# Patient Record
Sex: Female | Born: 1945 | Race: White | Hispanic: No | Smoking: Never smoker
Health system: Southern US, Community
[De-identification: ages and names within clinical notes are randomized; demographics above are authoritative.]

## PROBLEM LIST (undated history)

## (undated) DIAGNOSIS — K449 Diaphragmatic hernia without obstruction or gangrene: Secondary | ICD-10-CM

## (undated) DIAGNOSIS — K219 Gastro-esophageal reflux disease without esophagitis: Secondary | ICD-10-CM

## (undated) DIAGNOSIS — M1990 Unspecified osteoarthritis, unspecified site: Secondary | ICD-10-CM

## (undated) DIAGNOSIS — H409 Unspecified glaucoma: Secondary | ICD-10-CM

## (undated) DIAGNOSIS — K6289 Other specified diseases of anus and rectum: Secondary | ICD-10-CM

## (undated) DIAGNOSIS — H40059 Ocular hypertension, unspecified eye: Secondary | ICD-10-CM

## (undated) DIAGNOSIS — T7840XA Allergy, unspecified, initial encounter: Secondary | ICD-10-CM

## (undated) DIAGNOSIS — K635 Polyp of colon: Secondary | ICD-10-CM

## (undated) DIAGNOSIS — E785 Hyperlipidemia, unspecified: Secondary | ICD-10-CM

## (undated) DIAGNOSIS — I10 Essential (primary) hypertension: Secondary | ICD-10-CM

## (undated) HISTORY — DX: Ocular hypertension, unspecified eye: H40.059

## (undated) HISTORY — PX: UPPER GASTROINTESTINAL ENDOSCOPY: SHX188

## (undated) HISTORY — DX: Unspecified osteoarthritis, unspecified site: M19.90

## (undated) HISTORY — PX: ABDOMINAL HYSTERECTOMY: SHX81

## (undated) HISTORY — DX: Allergy, unspecified, initial encounter: T78.40XA

## (undated) HISTORY — DX: Essential (primary) hypertension: I10

## (undated) HISTORY — DX: Other specified diseases of anus and rectum: K62.89

## (undated) HISTORY — DX: Hyperlipidemia, unspecified: E78.5

## (undated) HISTORY — DX: Unspecified glaucoma: H40.9

## (undated) HISTORY — PX: APPENDECTOMY: SHX54

## (undated) HISTORY — DX: Polyp of colon: K63.5

## (undated) HISTORY — PX: OTHER SURGICAL HISTORY: SHX169

## (undated) HISTORY — PX: HEMORRHOID SURGERY: SHX153

## (undated) HISTORY — DX: Diaphragmatic hernia without obstruction or gangrene: K44.9

## (undated) HISTORY — DX: Gastro-esophageal reflux disease without esophagitis: K21.9

---

## 1999-07-25 ENCOUNTER — Other Ambulatory Visit: Admission: RE | Admit: 1999-07-25 | Discharge: 1999-07-25 | Payer: Self-pay | Admitting: Family Medicine

## 2000-12-24 ENCOUNTER — Encounter: Admission: RE | Admit: 2000-12-24 | Discharge: 2000-12-24 | Payer: Self-pay | Admitting: Family Medicine

## 2000-12-24 ENCOUNTER — Encounter: Payer: Self-pay | Admitting: Family Medicine

## 2000-12-25 ENCOUNTER — Encounter: Payer: Self-pay | Admitting: Family Medicine

## 2000-12-25 ENCOUNTER — Encounter: Admission: RE | Admit: 2000-12-25 | Discharge: 2000-12-25 | Payer: Self-pay | Admitting: Family Medicine

## 2001-07-10 HISTORY — PX: BREAST CYST ASPIRATION: SHX578

## 2001-07-23 ENCOUNTER — Encounter: Payer: Self-pay | Admitting: Family Medicine

## 2001-07-23 ENCOUNTER — Encounter: Admission: RE | Admit: 2001-07-23 | Discharge: 2001-07-23 | Payer: Self-pay | Admitting: Family Medicine

## 2002-02-18 ENCOUNTER — Encounter: Payer: Self-pay | Admitting: Family Medicine

## 2002-02-18 ENCOUNTER — Other Ambulatory Visit: Admission: RE | Admit: 2002-02-18 | Discharge: 2002-02-18 | Payer: Self-pay | Admitting: Family Medicine

## 2002-03-03 ENCOUNTER — Encounter: Admission: RE | Admit: 2002-03-03 | Discharge: 2002-03-03 | Payer: Self-pay | Admitting: Family Medicine

## 2002-03-03 ENCOUNTER — Encounter: Payer: Self-pay | Admitting: Family Medicine

## 2004-07-29 ENCOUNTER — Ambulatory Visit: Payer: Self-pay | Admitting: Family Medicine

## 2004-08-30 ENCOUNTER — Ambulatory Visit: Payer: Self-pay | Admitting: Family Medicine

## 2004-09-06 ENCOUNTER — Ambulatory Visit: Payer: Self-pay | Admitting: Gastroenterology

## 2004-09-19 ENCOUNTER — Ambulatory Visit: Payer: Self-pay | Admitting: Gastroenterology

## 2004-11-28 ENCOUNTER — Ambulatory Visit: Payer: Self-pay | Admitting: Family Medicine

## 2004-11-29 ENCOUNTER — Encounter: Admission: RE | Admit: 2004-11-29 | Discharge: 2004-11-29 | Payer: Self-pay | Admitting: Family Medicine

## 2005-03-15 ENCOUNTER — Ambulatory Visit: Payer: Self-pay | Admitting: Family Medicine

## 2005-03-22 ENCOUNTER — Ambulatory Visit: Payer: Self-pay | Admitting: Family Medicine

## 2005-06-12 ENCOUNTER — Ambulatory Visit: Payer: Self-pay | Admitting: Family Medicine

## 2005-06-28 ENCOUNTER — Ambulatory Visit: Payer: Self-pay | Admitting: Family Medicine

## 2005-12-11 ENCOUNTER — Encounter: Admission: RE | Admit: 2005-12-11 | Discharge: 2005-12-11 | Payer: Self-pay | Admitting: Family Medicine

## 2006-02-09 ENCOUNTER — Ambulatory Visit: Payer: Self-pay | Admitting: Family Medicine

## 2006-03-10 HISTORY — PX: CATARACT EXTRACTION: SUR2

## 2006-12-25 ENCOUNTER — Encounter: Admission: RE | Admit: 2006-12-25 | Discharge: 2006-12-25 | Payer: Self-pay | Admitting: Family Medicine

## 2006-12-28 ENCOUNTER — Encounter: Admission: RE | Admit: 2006-12-28 | Discharge: 2006-12-28 | Payer: Self-pay | Admitting: Family Medicine

## 2006-12-31 ENCOUNTER — Encounter (INDEPENDENT_AMBULATORY_CARE_PROVIDER_SITE_OTHER): Payer: Self-pay | Admitting: *Deleted

## 2007-01-07 ENCOUNTER — Encounter (INDEPENDENT_AMBULATORY_CARE_PROVIDER_SITE_OTHER): Payer: Self-pay | Admitting: *Deleted

## 2007-02-08 ENCOUNTER — Encounter: Payer: Self-pay | Admitting: Family Medicine

## 2007-02-08 DIAGNOSIS — N39498 Other specified urinary incontinence: Secondary | ICD-10-CM

## 2007-02-08 DIAGNOSIS — M5137 Other intervertebral disc degeneration, lumbosacral region: Secondary | ICD-10-CM

## 2007-02-08 DIAGNOSIS — K589 Irritable bowel syndrome without diarrhea: Secondary | ICD-10-CM

## 2007-02-08 DIAGNOSIS — M1990 Unspecified osteoarthritis, unspecified site: Secondary | ICD-10-CM | POA: Insufficient documentation

## 2007-02-08 DIAGNOSIS — I10 Essential (primary) hypertension: Secondary | ICD-10-CM | POA: Insufficient documentation

## 2007-02-08 DIAGNOSIS — N6019 Diffuse cystic mastopathy of unspecified breast: Secondary | ICD-10-CM

## 2007-03-05 ENCOUNTER — Ambulatory Visit: Payer: Self-pay | Admitting: Family Medicine

## 2007-03-05 DIAGNOSIS — E785 Hyperlipidemia, unspecified: Secondary | ICD-10-CM | POA: Insufficient documentation

## 2007-03-05 DIAGNOSIS — K219 Gastro-esophageal reflux disease without esophagitis: Secondary | ICD-10-CM | POA: Insufficient documentation

## 2007-03-05 DIAGNOSIS — M8580 Other specified disorders of bone density and structure, unspecified site: Secondary | ICD-10-CM | POA: Insufficient documentation

## 2007-03-06 LAB — CONVERTED CEMR LAB
ALT: 17 units/L (ref 0–35)
Albumin: 4.2 g/dL (ref 3.5–5.2)
Calcium: 9.7 mg/dL (ref 8.4–10.5)
Creatinine, Ser: 0.7 mg/dL (ref 0.4–1.2)
Eosinophils Absolute: 0.1 10*3/uL (ref 0.0–0.6)
GFR calc Af Amer: 109 mL/min
GFR calc non Af Amer: 90 mL/min
Glucose, Bld: 90 mg/dL (ref 70–99)
HCT: 36.5 % (ref 36.0–46.0)
HDL: 55.5 mg/dL (ref >39.0)
Hemoglobin: 13 g/dL (ref 12.0–15.0)
Neutrophils Relative %: 58.1 % (ref 43.0–77.0)
Phosphorus: 4.2 mg/dL (ref 2.3–4.6)
Platelets: 229 10*3/uL (ref 150–400)
Potassium: 4.1 meq/L (ref 3.5–5.1)
RDW: 11.9 % (ref 11.5–14.6)
TSH: 0.44 microintl units/mL (ref 0.35–5.50)
Triglycerides: 123 mg/dL (ref 0–149)
WBC: 7.9 10*3/uL (ref 4.5–10.5)

## 2007-05-11 HISTORY — PX: ESOPHAGOGASTRODUODENOSCOPY: SHX1529

## 2007-05-21 ENCOUNTER — Ambulatory Visit: Payer: Self-pay | Admitting: Family Medicine

## 2007-05-28 ENCOUNTER — Ambulatory Visit: Payer: Self-pay | Admitting: Gastroenterology

## 2007-05-29 ENCOUNTER — Ambulatory Visit: Payer: Self-pay | Admitting: Gastroenterology

## 2007-05-30 ENCOUNTER — Encounter: Payer: Self-pay | Admitting: Family Medicine

## 2007-05-30 ENCOUNTER — Ambulatory Visit (HOSPITAL_COMMUNITY): Admission: RE | Admit: 2007-05-30 | Discharge: 2007-05-30 | Payer: Self-pay | Admitting: Gastroenterology

## 2007-05-31 ENCOUNTER — Encounter: Payer: Self-pay | Admitting: Family Medicine

## 2007-06-03 ENCOUNTER — Ambulatory Visit: Payer: Self-pay | Admitting: Family Medicine

## 2007-08-19 DIAGNOSIS — D126 Benign neoplasm of colon, unspecified: Secondary | ICD-10-CM

## 2007-08-19 DIAGNOSIS — K2100 Gastro-esophageal reflux disease with esophagitis, without bleeding: Secondary | ICD-10-CM | POA: Insufficient documentation

## 2007-08-19 DIAGNOSIS — K449 Diaphragmatic hernia without obstruction or gangrene: Secondary | ICD-10-CM | POA: Insufficient documentation

## 2007-09-16 ENCOUNTER — Telehealth: Payer: Self-pay | Admitting: Family Medicine

## 2007-10-24 ENCOUNTER — Encounter: Payer: Self-pay | Admitting: Family Medicine

## 2007-10-24 ENCOUNTER — Telehealth: Payer: Self-pay | Admitting: Family Medicine

## 2007-10-25 ENCOUNTER — Encounter: Payer: Self-pay | Admitting: Family Medicine

## 2007-10-30 ENCOUNTER — Ambulatory Visit: Payer: Self-pay | Admitting: Gastroenterology

## 2007-11-07 ENCOUNTER — Telehealth: Payer: Self-pay | Admitting: Gastroenterology

## 2007-11-11 ENCOUNTER — Ambulatory Visit: Payer: Self-pay | Admitting: Gastroenterology

## 2007-11-11 ENCOUNTER — Encounter: Payer: Self-pay | Admitting: Gastroenterology

## 2007-11-11 ENCOUNTER — Encounter: Payer: Self-pay | Admitting: Family Medicine

## 2008-01-06 ENCOUNTER — Encounter: Admission: RE | Admit: 2008-01-06 | Discharge: 2008-01-06 | Payer: Self-pay | Admitting: Family Medicine

## 2008-01-08 ENCOUNTER — Encounter (INDEPENDENT_AMBULATORY_CARE_PROVIDER_SITE_OTHER): Payer: Self-pay | Admitting: *Deleted

## 2008-02-03 ENCOUNTER — Ambulatory Visit: Payer: Self-pay | Admitting: Family Medicine

## 2008-02-03 LAB — CONVERTED CEMR LAB
Bilirubin Urine: NEGATIVE
Nitrite: NEGATIVE
Urobilinogen, UA: 0.2
pH: 5

## 2008-08-10 ENCOUNTER — Telehealth: Payer: Self-pay | Admitting: Family Medicine

## 2008-08-12 ENCOUNTER — Ambulatory Visit: Payer: Self-pay | Admitting: Family Medicine

## 2008-08-12 LAB — CONVERTED CEMR LAB
Epithelial cells, urine: 1 /lpf
Glucose, Urine, Semiquant: NEGATIVE
Protein, U semiquant: NEGATIVE
Specific Gravity, Urine: <1.005
Urobilinogen, UA: 0.2

## 2008-08-13 ENCOUNTER — Ambulatory Visit: Payer: Self-pay | Admitting: Family Medicine

## 2008-08-17 LAB — CONVERTED CEMR LAB
AST: 17 units/L (ref 0–37)
CO2: 0 meq/L — CL (ref 19–32)
Chloride: 110 meq/L (ref 96–112)
Creatinine, Ser: 0.7 mg/dL (ref 0.4–1.2)
Eosinophils Absolute: 0.3 10*3/uL (ref 0.0–0.7)
GFR calc Af Amer: 108 mL/min
Glucose, Bld: 83 mg/dL (ref 70–99)
HCT: 37.9 % (ref 36.0–46.0)
Hemoglobin: 12.7 g/dL (ref 12.0–15.0)
LDL Cholesterol: 78 mg/dL (ref 0–99)
Lymphocytes Relative: 35.4 % (ref 12.0–46.0)
MCV: 90 fL (ref 78.0–100.0)
Monocytes Relative: 8 % (ref 3.0–12.0)
Potassium: 4.4 meq/L (ref 3.5–5.1)
RBC: 4.21 M/uL (ref 3.87–5.11)
RDW: 12.1 % (ref 11.5–14.6)
Sodium: 142 meq/L (ref 135–145)
TSH: 0.65 microintl units/mL (ref 0.35–5.50)
Total CHOL/HDL Ratio: 2.4
Triglycerides: 99 mg/dL (ref 0–149)
WBC: 7.1 10*3/uL (ref 4.5–10.5)

## 2008-10-08 ENCOUNTER — Ambulatory Visit: Payer: Self-pay | Admitting: Family Medicine

## 2008-10-08 DIAGNOSIS — M2550 Pain in unspecified joint: Secondary | ICD-10-CM | POA: Insufficient documentation

## 2008-10-12 LAB — CONVERTED CEMR LAB
Anti Nuclear Antibody(ANA): NEGATIVE
Rhuematoid fact SerPl-aCnc: <20 intl units/mL (ref 0–20)

## 2008-11-03 ENCOUNTER — Ambulatory Visit: Payer: Self-pay | Admitting: Family Medicine

## 2008-11-03 DIAGNOSIS — J309 Allergic rhinitis, unspecified: Secondary | ICD-10-CM | POA: Insufficient documentation

## 2008-11-05 ENCOUNTER — Telehealth: Payer: Self-pay | Admitting: Family Medicine

## 2008-11-05 ENCOUNTER — Encounter: Payer: Self-pay | Admitting: Family Medicine

## 2008-12-22 ENCOUNTER — Ambulatory Visit: Payer: Self-pay | Admitting: Family Medicine

## 2008-12-22 LAB — CONVERTED CEMR LAB
Bilirubin Urine: NEGATIVE
Nitrite: NEGATIVE
Specific Gravity, Urine: 1.025

## 2008-12-23 ENCOUNTER — Encounter (INDEPENDENT_AMBULATORY_CARE_PROVIDER_SITE_OTHER): Payer: Self-pay | Admitting: Internal Medicine

## 2009-02-08 ENCOUNTER — Encounter: Admission: RE | Admit: 2009-02-08 | Discharge: 2009-02-08 | Payer: Self-pay | Admitting: Family Medicine

## 2009-02-11 ENCOUNTER — Encounter (INDEPENDENT_AMBULATORY_CARE_PROVIDER_SITE_OTHER): Payer: Self-pay | Admitting: *Deleted

## 2009-04-26 ENCOUNTER — Ambulatory Visit: Payer: Self-pay | Admitting: Family Medicine

## 2009-08-06 ENCOUNTER — Ambulatory Visit: Payer: Self-pay | Admitting: Family Medicine

## 2009-11-11 ENCOUNTER — Telehealth: Payer: Self-pay | Admitting: Family Medicine

## 2009-11-13 ENCOUNTER — Encounter: Payer: Self-pay | Admitting: Family Medicine

## 2009-11-24 ENCOUNTER — Ambulatory Visit: Payer: Self-pay | Admitting: Family Medicine

## 2009-11-25 ENCOUNTER — Encounter: Payer: Self-pay | Admitting: Family Medicine

## 2009-11-25 LAB — CONVERTED CEMR LAB
Alkaline Phosphatase: 66 units/L (ref 39–117)
BUN: 14 mg/dL (ref 6–23)
CO2: 30 meq/L (ref 19–32)
Calcium: 10 mg/dL (ref 8.4–10.5)
Chloride: 104 meq/L (ref 96–112)
Cholesterol: 179 mg/dL (ref 0–200)
Eosinophils Relative: 1.9 % (ref 0.0–5.0)
HDL: 73.2 mg/dL (ref >39.00)
Lymphocytes Relative: 28.6 % (ref 12.0–46.0)
Monocytes Absolute: 0.6 10*3/uL (ref 0.1–1.0)
Monocytes Relative: 8.4 % (ref 3.0–12.0)
Neutro Abs: 4.7 10*3/uL (ref 1.4–7.7)
RBC: 4.22 M/uL (ref 3.87–5.11)
RDW: 12.6 % (ref 11.5–14.6)
Triglycerides: 98 mg/dL (ref 0.0–149.0)
VLDL: 19.6 mg/dL (ref 0.0–40.0)
WBC: 7.7 10*3/uL (ref 4.5–10.5)

## 2009-12-01 ENCOUNTER — Ambulatory Visit: Payer: Self-pay | Admitting: Internal Medicine

## 2009-12-01 ENCOUNTER — Encounter: Payer: Self-pay | Admitting: Family Medicine

## 2009-12-20 ENCOUNTER — Encounter (INDEPENDENT_AMBULATORY_CARE_PROVIDER_SITE_OTHER): Payer: Self-pay | Admitting: *Deleted

## 2010-01-24 ENCOUNTER — Ambulatory Visit: Payer: Self-pay | Admitting: Family Medicine

## 2010-01-24 DIAGNOSIS — J020 Streptococcal pharyngitis: Secondary | ICD-10-CM

## 2010-03-07 ENCOUNTER — Encounter: Admission: RE | Admit: 2010-03-07 | Discharge: 2010-03-07 | Payer: Self-pay | Admitting: Family Medicine

## 2010-03-07 LAB — HM MAMMOGRAPHY: HM Mammogram: NEGATIVE

## 2010-03-08 ENCOUNTER — Encounter (INDEPENDENT_AMBULATORY_CARE_PROVIDER_SITE_OTHER): Payer: Self-pay | Admitting: *Deleted

## 2010-03-21 ENCOUNTER — Ambulatory Visit: Payer: Self-pay | Admitting: Family Medicine

## 2010-03-21 DIAGNOSIS — M25569 Pain in unspecified knee: Secondary | ICD-10-CM | POA: Insufficient documentation

## 2010-03-21 DIAGNOSIS — M67919 Unspecified disorder of synovium and tendon, unspecified shoulder: Secondary | ICD-10-CM | POA: Insufficient documentation

## 2010-03-21 DIAGNOSIS — M159 Polyosteoarthritis, unspecified: Secondary | ICD-10-CM | POA: Insufficient documentation

## 2010-03-21 DIAGNOSIS — M719 Bursopathy, unspecified: Secondary | ICD-10-CM

## 2010-03-28 ENCOUNTER — Encounter: Payer: Self-pay | Admitting: Family Medicine

## 2010-03-30 ENCOUNTER — Encounter: Payer: Self-pay | Admitting: Family Medicine

## 2010-04-04 ENCOUNTER — Encounter: Payer: Self-pay | Admitting: Family Medicine

## 2010-04-07 ENCOUNTER — Encounter: Payer: Self-pay | Admitting: Family Medicine

## 2010-04-20 ENCOUNTER — Encounter: Payer: Self-pay | Admitting: Family Medicine

## 2010-05-10 ENCOUNTER — Encounter: Payer: Self-pay | Admitting: Family Medicine

## 2010-05-13 ENCOUNTER — Ambulatory Visit: Payer: Self-pay | Admitting: Family Medicine

## 2010-06-17 ENCOUNTER — Telehealth: Payer: Self-pay | Admitting: Family Medicine

## 2010-06-17 ENCOUNTER — Encounter: Payer: Self-pay | Admitting: Family Medicine

## 2010-07-31 ENCOUNTER — Encounter: Payer: Self-pay | Admitting: Family Medicine

## 2010-08-11 NOTE — Miscellaneous (Signed)
Summary: Physical Therapy Status Report/[facility]  Physical Therapy Status Report/[facility]   Imported By: Maryln Gottron 05/16/2010 11:05:51  _____________________________________________________________________  External Attachment:    Type:   Image     Comment:   External Document

## 2010-08-11 NOTE — Miscellaneous (Signed)
Summary: BONE DENSITY  Clinical Lists Changes  Orders: Added new Test order of T-Bone Densitometry (77080) - Signed Added new Test order of T-Lumbar Vertebral Assessment (77082) - Signed 

## 2010-08-11 NOTE — Miscellaneous (Signed)
Summary: PT Note/[facility]  PT Note/[facility]   Imported By: Lanelle Bal 04/22/2010 14:20:59  _____________________________________________________________________  External Attachment:    Type:   Image     Comment:   External Document

## 2010-08-11 NOTE — Progress Notes (Signed)
Summary: wants to change to generic lipitor  Phone Note Call from Patient Call back at Home Phone [phone]   Caller: Patient Call For: Judith Part MD Summary of Call: Pt would like to change to generic lipitor.  She checked with her insurance and this will be less costly.  [facility] [address]. Initial call taken by: Lowella Petties CMA, AAMA,  June 17, 2010 9:57 AM  Follow-up for Phone Call        she already has an active px  please call pharmacy and authorize them to give generic  I always sign on generic line anyway Follow-up by: Judith Part MD,  June 17, 2010 10:23 AM  Additional Follow-up for Phone Call Additional follow up Details #1::        Shanda Bumps at [facility] said would change to generic Left message for Pt to call back.Lewanda Rife LPN  June 17, 2010 12:56 PM   Advised pt. Additional Follow-up by: Lowella Petties CMA, AAMA,  June 17, 2010 2:46 PM    9+

## 2010-08-11 NOTE — Letter (Signed)
Summary: Results Follow up Letter  [place] [facility]  [address] [place], [place] [address]   Phone: [phone]  Fax: [phone]    12/20/2009 MRN: [id number]    [name] [address] [place], [place]  [address]    Dear Ms. Roney Marion,  The following are the results of your recent test(s):  Test         Result    Pap Smear:        Normal _____  Not Normal _____ Comments: ______________________________________________________ Cholesterol: LDL(Bad cholesterol):         Your goal is less than:         HDL (Good cholesterol):       Your goal is more than: Comments:  ______________________________________________________ Mammogram:        Normal _____  Not Normal _____ Comments:  ___________________________________________________________________ Hemoccult:        Normal _____  Not normal _______ Comments:    _____________________________________________________________________ Other Tests:   Bone Density: Bone density is slightly worse than last time.  Continue vitamin  D and calcium  and exercise. We can discuss this  in more detail at your  next follow up appointment.    We routinely do not discuss normal results over the telephone.  If you desire a copy of the results, or you have any questions about this information we can discuss them at your next office visit.   Sincerely,    Marne A. Milinda Antis, M.D.  MAT:lsf

## 2010-08-11 NOTE — Miscellaneous (Signed)
Summary: PT Note/[facility]  PT Note/[facility]   Imported By: Lanelle Bal 04/26/2010 11:06:33  _____________________________________________________________________  External Attachment:    Type:   Image     Comment:   External Document

## 2010-08-11 NOTE — Miscellaneous (Signed)
Summary: PT Care Plan/[facility]  PT Care Plan/[facility]   Imported By: Lanelle Bal 04/15/2010 09:01:44  _____________________________________________________________________  External Attachment:    Type:   Image     Comment:   External Document

## 2010-08-11 NOTE — Letter (Signed)
Summary: Plan/[facility]  Plan/[facility]   Imported By: Lester Hanahan 04/07/2010 08:33:01  _____________________________________________________________________  External Attachment:    Type:   Image     Comment:   External Document

## 2010-08-11 NOTE — Letter (Signed)
Summary: Results Follow up Letter  [place] [facility]  [address] [place], [place] [address]   Phone: [phone]  Fax: [phone]    03/08/2010 MRN: [id number]    Deborah Gardner [address] [place], [place]  [address]    Dear Ms. DIPINTO,  The following are the results of your recent test(s):  Test         Result    Pap Smear:        Normal _____  Not Normal _____ Comments: ______________________________________________________ Cholesterol: LDL(Bad cholesterol):         Your goal is less than:         HDL (Good cholesterol):       Your goal is more than: Comments:  ______________________________________________________ Mammogram:        Normal __X___  Not Normal _____ Comments:  Yearly follow up is recommended.   ___________________________________________________________________ Hemoccult:        Normal _____  Not normal _______ Comments:    _____________________________________________________________________ Other Tests:    We routinely do not discuss normal results over the telephone.  If you desire a copy of the results, or you have any questions about this information we can discuss them at your next office visit.   Sincerely,   Marne A. Milinda Antis, M.D.  MAT:lsf

## 2010-08-11 NOTE — Assessment & Plan Note (Signed)
Summary: SORE THROAT/DLO   Vital Signs:  Patient profile:   65 year old female Height:      61 inches Weight:      148.25 pounds BMI:     28.11 Temp:     99.5 degrees F oral Pulse rate:   80 / minute Pulse rhythm:   regular BP sitting:   120 / 80  (left arm) Cuff size:   regular  Vitals Entered By: Lewanda Rife LPN (January 24, 2010 3:10 PM) CC: Sorethroat, neck glands feel swollen and today has dizziness   History of Present Illness: went to the beach last week felt chills and st since that thursday  since she got home - got worse glands got swollen achey all over- thought it was from arthritis  a few good days and bad  yesterday and today feels really bad   really achey- the upper half of body no rash    no vomiting but is nauseated  has been able to sip on fluids  using [employer]  taking tylenol and ibuprofen for fever   Allergies (verified): No Known Drug Allergies  Review of Systems General:  Complains of chills, fatigue, fever, loss of appetite, and malaise. Eyes:  Denies discharge and eye irritation. ENT:  Complains of postnasal drainage and sore throat; denies earache. CV:  Denies chest pain or discomfort and palpitations. Resp:  Denies cough and wheezing. GI:  Denies abdominal pain, diarrhea, nausea, and vomiting. MS:  Denies joint pain, joint redness, and joint swelling. Derm:  Denies lesion(s), poor wound healing, and rash. Neuro:  Denies numbness and tingling. Heme:  Denies abnormal bruising.  Physical Exam  General:  fatigued appearing female Head:  normocephalic, atraumatic, no abnormalities observed, and no abnormalities palpated.  no sinus tenderness Eyes:  vision grossly intact, pupils equal, pupils round, pupils reactive to light, and no injection.   Ears:  R ear normal and L ear normal.   Nose:  nares are boggy but clear  Mouth:  diffuse erythema in throat no swelling or exudate Neck:  nl rom and supple some ant and post  cervical LN Chest Wall:  No deformities, masses, or tenderness noted. Lungs:  Normal respiratory effort, chest expands symmetrically. Lungs are clear to auscultation, no crackles or wheezes. Heart:  Normal rate and regular rhythm. S1 and S2 normal without gallop, murmur, click, rub or other extra sounds. Skin:  Intact without suspicious lesions or rashes Cervical Nodes:  some tender ant and post cervical LNs - mobile Psych:  nl affect- seems fatigued    Impression & Recommendations:  Problem # 1:  STREP THROAT (ICD-034.0) Assessment New  uncomplicated with fever and st and malaise will tx with amox 500 three times a day for 10 days and update recommend sympt care- see pt instructions   pt advised to update me if symptoms worsen or do not improve - esp worse st or fever Her updated medication list for this problem includes:    Bayer Low Strength 81 Mg Tbec (Aspirin) ..... One by mouth every day    Tylenol Extra Strength 500 Mg Tabs (Acetaminophen) .Marland Kitchen... As needed    Ibuprofen 200 Mg Tabs (Ibuprofen) .Marland Kitchen... As needed    Amoxicillin 500 Mg Caps (Amoxicillin) .Marland Kitchen... 1 by mouth three times a day for 10 days  Orders: Rapid Strep (60454) Prescription Created Electronically [phone]  Complete Medication List: 1)  Lotrel 5-10 Mg Caps (Amlodipine besy-benazepril hcl) .... Take one by mouth daily 2)  Lipitor 20 Mg Tabs (Atorvastatin calcium) .... Take one by mouth daily 3)  Nexium 40 Mg Cpdr (Esomeprazole magnesium) .Marland Kitchen.. 1 by mouth once daily in am 4)  Calcium  .... Take by mouth as directed 5)  Multi Vitamin  .... Take by mouth as directed 6)  Bayer Low Strength 81 Mg Tbec (Aspirin) .... One by mouth every day 7)  Fish Oil Oil (Fish oil) .... 2 caps by mouth daily 8)  Flonase 50 Mcg/act Susp (Fluticasone propionate) .... 2 sprays in each nostril once daily as needed 9)  Vitamin D 1000 Unit Tabs (Cholecalciferol) .... Take one by mouth daily 10)  Vitamin C 1000 Mg Tabs (Ascorbic acid) ....  Take one by mouth daily as needed 11)  Tylenol Extra Strength 500 Mg Tabs (Acetaminophen) .... As needed 12)  Ibuprofen 200 Mg Tabs (Ibuprofen) .... As needed 13)  Arthaffect (herbal Joint Med With Glucosamine)  .... One scoop with juice daily 14)  Amoxicillin 500 Mg Caps (Amoxicillin) .Marland Kitchen.. 1 by mouth three times a day for 10 days  Patient Instructions: 1)  drink/ sip lots of fluids 2)  continue tylenol  3)  chloraseptic throat spray is helpful 4)  take the amoxicillin as directed 5)  update me if not improving in 2-3 days 6)  finish the whole course  7)  I sent px to your pharmacy Prescriptions: AMOXICILLIN 500 MG CAPS (AMOXICILLIN) 1 by mouth three times a day for 10 days  #30 x 0   Entered and Authorized by:   Judith Part MD   Signed by:   Judith Part MD on 01/24/2010   Method used:   [id number]       [address]       [place], [place]  [address]       Ph: [phone]       Fax: [phone]   RxID:   [id number]   Current Allergies (reviewed today): No known allergies   Laboratory Results  Date/Time Received: January 24, 2010 3:11 PM  Date/Time Reported: January 24, 2010 3:11 PM   Other Tests  Rapid Strep: positive  Kit Test Internal QC: Positive   (Normal Range: Negative)
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
## 2010-08-11 NOTE — Medication Information (Signed)
Summary: Approval for Additional Quantity Nexium/[facility]  Approval for Additional Quantity Nexium/[facility]   Imported By: Lanelle Bal 11/18/2009 08:37:58  _____________________________________________________________________  External Attachment:    Type:   Image     Comment:   External Document

## 2010-08-11 NOTE — Progress Notes (Signed)
Summary: Prior Authorization Nexium  Phone Note From Pharmacy Call back at [phone]   [facility]er[facility] Call For: Dr. Milinda Antis  Summary of Call: Received form from pharmacy stating that Nexium needs PA.  Called [facility] [phone] and the rep will fax form today.  Linde Gillis CMA Duncan Dull)  Nov 11, 2009 2:29 PM   PA form in your IN box.   Initial call taken by: Linde Gillis CMA Duncan Dull),  Nov 11, 2009 3:59 PM  Follow-up for Phone Call        form done and in nurse in box  Follow-up by: Judith Part MD,  Nov 12, 2009 7:58 AM  Additional Follow-up for Phone Call Additional follow up Details #1::        Completed form faxed to [phone]. form in [place] auth folder if needed later.Lewanda Rife LPN  Nov 13, 863 11:24 AM      Appended Document: Prior Authorization Nexium Received PA Approval for Nexium.  Approved from 10/23/2009 until 11/13/2010.  Will sent letter to be scanned into EMR.

## 2010-08-11 NOTE — Miscellaneous (Signed)
Summary: PT Care Plan/[facility]  PT Care Plan/[facility]   Imported By: Lanelle Bal 04/28/2010 11:37:05  _____________________________________________________________________  External Attachment:    Type:   Image     Comment:   External Document

## 2010-08-12 ENCOUNTER — Ambulatory Visit: Payer: PRIVATE HEALTH INSURANCE | Admitting: Family Medicine

## 2010-08-12 ENCOUNTER — Encounter: Payer: Self-pay | Admitting: Family Medicine

## 2010-08-12 DIAGNOSIS — K209 Esophagitis, unspecified without bleeding: Secondary | ICD-10-CM | POA: Insufficient documentation

## 2010-08-13 DIAGNOSIS — K449 Diaphragmatic hernia without obstruction or gangrene: Secondary | ICD-10-CM

## 2010-08-13 DIAGNOSIS — N390 Urinary tract infection, site not specified: Secondary | ICD-10-CM

## 2010-08-13 DIAGNOSIS — K209 Esophagitis, unspecified: Secondary | ICD-10-CM
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
## 2010-08-25 NOTE — Miscellaneous (Signed)
Summary: Physical Therapy Discharge Note/[facility]  Physical Therapy Discharge Note/[facility]   Imported By: Maryln Gottron 08/12/2010 15:07:00  _____________________________________________________________________  External Attachment:    Type:   Image     Comment:   External Document

## 2010-09-09 ENCOUNTER — Telehealth: Payer: Self-pay | Admitting: Family Medicine

## 2010-09-15 NOTE — Progress Notes (Signed)
Summary: wants rx prilosec   Phone Note Call from Patient Call back at Home Phone [phone]   Caller: Patient Call For: Judith Part MD Summary of Call: Patient was taking nexium, but it was so expensive. She then tried prilosec otc and it has really been working for her. She is asking if she can get a rx for the prilosec called in to [address] so that she can get it with her insurance.  She says that she takes one in the morning and one with her evening meal.  Initial call taken by: Melody Comas,  September 09, 2010 10:06 AM  Follow-up for Phone Call        we can try generic omeprazole - that is prilosec and may be even cheaper let me know how it works px written on EMR for call in  Follow-up by: Judith Part MD,  September 09, 2010 11:29 AM  Additional Follow-up for Phone Call Additional follow up Details #1::        Patient says she has to take 2 a day, one a day didn't help.  She will check with her insurance but she says it paid good on her Prilosec some time ago.  She was on it and it wasn't helping so she went on Nexium but since it was so expensive, she started back on Prilosec and takes it two times a day.  Lugene Fuquay CMA Duncan Dull)  September 09, 2010 3:06 PM     Additional Follow-up for Phone Call Additional follow up Details #2::    will change the px -- did not know she needed it bid Follow-up by: Judith Part MD,  September 09, 2010 3:36 PM  Additional Follow-up for Phone Call Additional follow up Details #3:: Details for Additional Follow-up Action Taken: Her insurance will cover two  Prilosec OTC 42 count pack and will pay for 2 per month for a $5 copay.  She requests [facility].  Lugene Fuquay CMA Duncan Dull)  September 09, 2010 4:11 PM    ok - will do it again  Additional Follow-up by: Judith Part MD,  September 09, 2010 4:12 PM  New/Updated Medications: OMEPRAZOLE 20 MG CPDR (OMEPRAZOLE) 1 by mouth once  daily OMEPRAZOLE 20 MG CPDR (OMEPRAZOLE) 1 by mouth two times a day PRILOSEC OTC 20 MG TBEC (OMEPRAZOLE MAGNESIUM) 1 by mouth two times a day Prescriptions: PRILOSEC OTC 20 MG TBEC (OMEPRAZOLE MAGNESIUM) 1 by mouth two times a day  #84 x 9   Entered by:   Delilah Shan CMA (AAMA)   Authorized by:   Judith Part MD   Signed by:   Delilah Shan CMA (AAMA) on 09/09/2010   Method used:   [facility]        [facility] [address] [id number]       [address]       [place], [place]  [address]       Ph: [phone]       Fax: [phone]   RxID:   [id number] PRILOSEC OTC 20 MG TBEC (OMEPRAZOLE MAGNESIUM) 1 by mouth two times a day  #84 x 9   Entered and Authorized by:   Judith Part MD   Signed by:   Judith Part MD on 09/09/2010   Method used:  Telephoned to ...       [facility] [address] [id number]       [address]       [place], [place]  [address]       Ph: [phone]       Fax: [phone]   RxID:   [id number] OMEPRAZOLE 20 MG CPDR (OMEPRAZOLE) 1 by mouth two times a day  #60 x 11   Entered and Authorized by:   Judith Part MD   Signed by:   Judith Part MD on 09/09/2010   Method used:   Telephoned to ...       [facility] [address] [id number]       [address]       [place], [place]  [address]       Ph: [phone]       Fax: [phone]   RxID:   [id number] OMEPRAZOLE 20 MG CPDR (OMEPRAZOLE) 1 by mouth once daily  #30 x 11   Entered and Authorized by:   Judith Part MD   Signed by:   Judith Part MD on 09/09/2010   Method used:   Telephoned to ...       [facility] [address] [id number]       [address]       [place], [place]  [address]       Ph: [phone]       Fax: [phone]   RxID:   [id number]

## 2010-11-22 NOTE — Assessment & Plan Note (Signed)
[facility]                         GASTROENTEROLOGY OFFICE NOTE   NAME:Deborah Gardner, Deborah Gardner                     MRN:          [id number]  DATE:05/28/2007                            DOB:          Oct 31, 1945    REFERRING PHYSICIAN:  Marne A. Tower, MD   CHIEF COMPLAINT:  Deborah Gardner is a 65 year old white female referred for  refractory acid reflux symptoms.   HISTORY OF PRESENT ILLNESS:  Deborah Gardner has had acid reflux for several  years.  This seems to be worse over the last several months.  She has  been on Nexium and Prilosec without too much symptomatic improvement.  She describes subxiphoid and epigastric pain with typical regurgitation  and some nocturnal wakening, nausea but no emesis, anorexia, or weight  loss. 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 In the past has been on Zelnorm.  She  also has a history of previous colon polyps with last colonoscopy in  March 2006.  She has a family history of colon carcinoma in her father.  She denies lower GI problems, melena, or hematochezia at this time.  She  also denies any specific hepatobiliary complaints or history of  hepatitis or pancreatitis.  Her appetite is good.  Her weight has been  stable.  She denies any specific food intolerances.  She has had  previous hemorrhoid surgery.   PAST MEDICAL HISTORY:  Remarkable for hypertension, and she has had a  previous appendectomy and hysterectomy.  She also suffers from chronic  anxiety and depression and has degenerative arthritis.   MEDICATIONS:  1. Lotrel 5-10 mg a day.  2. Lipitor 20 mg a day.  3. Calcium daily.  4. Multivitamins.  5. Flonase spray daily.   ALLERGIES:  No known drug allergies.   FAMILY HISTORY:  Remarkable for colon cancer in her father at a  relatively young age.  There is also breast cancer in several aunts, and  multiple family members have alcoholism.   SOCIAL  HISTORY:  She is married and lives with her husband.  She has a  12th grade education.  She works as a Occupational hygienist.  She does  not smoke or use ethanol.   REVIEW OF SYSTEMS:  Otherwise noncontributory except for some mild  arthralgias in multiple joints.   PHYSICAL EXAMINATION:  GENERAL: She is a healthy, attractive-appearing  white female in no distress, younger than her stated age.  VITAL SIGNS:  She is 5 feet 6 inches tall, weighs 149 pounds.  Blood  pressure 130/76, pulse 60 and regular.  SKIN:  I could not appreciate stigmata of chronic liver disease or  thyromegaly.  CHEST:  Clear.  HEART:  She was in a regular rhythm without murmurs, gallops, or rubs.  ABDOMEN:  I could not appreciate hepatosplenomegaly, abdominal mass, but  there was slight tenderness in the epigastric-subxiphoid area.  Bowel  sounds were normal.  EXTREMITIES:  Unremarkable.  NEUROLOGIC:  Mental status was clear.   ASSESSMENT:  1. Refractory acid reflux for reasons which are unclear other than she  probably is not taking her Nexium at the appropriate time.  Most of      her reflux symptoms are at night and not in the morning.  In      reviewing her chart, I cannot see where she had previous endoscopic      screening.  2. Family history of colon carcinoma.  The patient does have an      adenomatous polyp with followup colonoscopy due in a couple of      years.  3. Well-controlled essential hypertension.  4. Constipation, predominantly irritable bowel syndrome.   RECOMMENDATIONS:  1. Continue high-fiber diet and fiber supplements with liberal p.o.      fluids.  2. Change Nexium to 40 mg 30 minutes before supper at night with      strict antireflux maneuvers.  3. Screening endoscopy exam.  4. Colonoscopy screening as per clinical protocol.  5. Continue other medications as per Dr. Milinda Antis.     Vania Rea. Jarold Motto, MD, Caleen Essex, FAGA  Electronically Signed    DRP/MedQ  DD: 05/28/2007   DT: 05/28/2007  Job #: [id number]   cc:   Marne A. Milinda Antis, MD

## 2010-12-05 ENCOUNTER — Other Ambulatory Visit: Payer: Self-pay

## 2010-12-05 MED ORDER — ATORVASTATIN CALCIUM 20 MG PO TABS: 20.0000 mg | ORAL_TABLET | Freq: Every day | ORAL | Status: DC

## 2010-12-05 NOTE — Telephone Encounter (Signed)
Faxed refill request [facility] for Lipitor 20mg .

## 2010-12-26 ENCOUNTER — Other Ambulatory Visit: Payer: Self-pay | Admitting: *Deleted

## 2010-12-26 MED ORDER — AMLODIPINE BESY-BENAZEPRIL HCL 5-10 MG PO CAPS: 1.0000 | ORAL_CAPSULE | Freq: Every day | ORAL | Status: DC

## 2011-02-10 ENCOUNTER — Other Ambulatory Visit: Payer: Self-pay | Admitting: *Deleted

## 2011-02-10 MED ORDER — ATORVASTATIN CALCIUM 20 MG PO TABS: 20.0000 mg | ORAL_TABLET | Freq: Every day | ORAL | Status: DC

## 2011-02-14 ENCOUNTER — Other Ambulatory Visit: Payer: Self-pay | Admitting: Family Medicine

## 2011-02-14 DIAGNOSIS — Z1231 Encounter for screening mammogram for malignant neoplasm of breast: Secondary | ICD-10-CM

## 2011-02-27 ENCOUNTER — Other Ambulatory Visit: Payer: Self-pay | Admitting: Family Medicine

## 2011-03-04 ENCOUNTER — Telehealth: Payer: Self-pay | Admitting: Family Medicine

## 2011-03-04 DIAGNOSIS — I10 Essential (primary) hypertension: Secondary | ICD-10-CM

## 2011-03-04 DIAGNOSIS — E785 Hyperlipidemia, unspecified: Secondary | ICD-10-CM

## 2011-03-04 DIAGNOSIS — K219 Gastro-esophageal reflux disease without esophagitis: Secondary | ICD-10-CM

## 2011-03-04 DIAGNOSIS — M949 Disorder of cartilage, unspecified: Secondary | ICD-10-CM

## 2011-03-04 NOTE — Telephone Encounter (Signed)
Message copied by Judy Pimple on Sat Mar 04, 2011  6:41 PM ------      Message from: Baldomero Lamy      Created: Wed Mar 01, 2011  1:43 PM      Regarding: cpx labs wed 8/29       Please order  future cpx labs for pt's upcomming lab appt.      Thanks      Rodney Booze

## 2011-03-08 ENCOUNTER — Ambulatory Visit (INDEPENDENT_AMBULATORY_CARE_PROVIDER_SITE_OTHER): Payer: Medicare Other | Admitting: Surgery

## 2011-03-08 ENCOUNTER — Other Ambulatory Visit (INDEPENDENT_AMBULATORY_CARE_PROVIDER_SITE_OTHER): Payer: Medicare Other

## 2011-03-08 ENCOUNTER — Encounter (INDEPENDENT_AMBULATORY_CARE_PROVIDER_SITE_OTHER): Payer: Self-pay | Admitting: Surgery

## 2011-03-08 VITALS — BP 126/84 | HR 60 | Temp 96.6°F | Ht 62.0 in | Wt 149.0 lb

## 2011-03-08 DIAGNOSIS — L290 Pruritus ani: Secondary | ICD-10-CM | POA: Insufficient documentation

## 2011-03-08 DIAGNOSIS — M899 Disorder of bone, unspecified: Secondary | ICD-10-CM

## 2011-03-08 DIAGNOSIS — E785 Hyperlipidemia, unspecified: Secondary | ICD-10-CM

## 2011-03-08 DIAGNOSIS — K219 Gastro-esophageal reflux disease without esophagitis: Secondary | ICD-10-CM

## 2011-03-08 DIAGNOSIS — I10 Essential (primary) hypertension: Secondary | ICD-10-CM

## 2011-03-08 DIAGNOSIS — K6289 Other specified diseases of anus and rectum: Secondary | ICD-10-CM

## 2011-03-08 LAB — TSH: TSH: 0.54 u[IU]/mL (ref 0.35–5.50)

## 2011-03-08 LAB — CBC WITH DIFFERENTIAL/PLATELET
Basophils Absolute: 0.1 10*3/uL (ref 0.0–0.1)
Basophils Relative: 0.9 % (ref 0.0–3.0)
Eosinophils Absolute: 0.1 10*3/uL (ref 0.0–0.7)
Eosinophils Relative: 2.3 % (ref 0.0–5.0)
Hemoglobin: 12.6 g/dL (ref 12.0–15.0)
Lymphocytes Relative: 29.3 % (ref 12.0–46.0)
Lymphs Abs: 1.8 10*3/uL (ref 0.7–4.0)
MCHC: 33.3 g/dL (ref 30.0–36.0)
Monocytes Relative: 8.1 % (ref 3.0–12.0)
RBC: 4.2 Mil/uL (ref 3.87–5.11)

## 2011-03-08 LAB — COMPREHENSIVE METABOLIC PANEL
ALT: 15 U/L (ref 0–35)
AST: 19 U/L (ref 0–37)
Alkaline Phosphatase: 61 U/L (ref 39–117)
Potassium: 4 mEq/L (ref 3.5–5.1)
Total Bilirubin: 0.7 mg/dL (ref 0.3–1.2)
Total Protein: 7.5 g/dL (ref 6.0–8.3)

## 2011-03-08 LAB — VITAMIN D 25 HYDROXY (VIT D DEFICIENCY, FRACTURES): Vit D, 25-Hydroxy: 51 ng/mL (ref 30–89)

## 2011-03-08 LAB — LIPID PANEL
Total CHOL/HDL Ratio: 2
VLDL: 17.4 mg/dL (ref 0.0–40.0)

## 2011-03-08 MED ORDER — HYDROCORTISONE ACETATE 25 MG RE SUPP: 25.0000 mg | Freq: Two times a day (BID) | RECTAL | Status: AC

## 2011-03-08 NOTE — Patient Instructions (Signed)
Use a fiber supplement daily until your bowel movements have returned to normal.  Use wet wipes to clean after a bowel movement.  Frequent sitz baths.  Use anusol suppositories as needed.

## 2011-03-08 NOTE — Progress Notes (Signed)
Chief Complaint  Patient presents with  . Other    thromb hemorrhoids    HPI Deborah Gardner is a 65 y.o. female.  HPI The patient presents with 4-5 days irregular bowel movements. She has developed some swelling and tenderness on one side of her rectum. She has also noted a few days of drainage in this area. She has been using sitz baths as well as over-the-counter Preparation H suppositories with slight improvement. She states that the swelling has actually gone down over the last couple of days.  Past Medical History  Diagnosis Date  . Cataract   . Hyperlipidemia   . Arthritis   . Osteoporosis   . Rectal pain     Past Surgical History  Procedure Date  . Abdominal hysterectomy   . Hemorrhoid surgery     Family History  Problem Relation Age of Onset  . Heart disease Mother 83    heart attack   . Cancer Father     Social History History  Substance Use Topics  . Smoking status: Never Smoker   . Smokeless tobacco: Not on file  . Alcohol Use: No    No Known Allergies  Current Outpatient Prescriptions  Medication Sig Dispense Refill  . amLODipine-benazepril (LOTREL) 5-10 MG per capsule TAKE 1 CAPSULE BY MOUTH DAILY.  30 capsule  0  . atorvastatin (LIPITOR) 20 MG tablet Take 1 tablet (20 mg total) by mouth daily.  30 tablet  0  . Calcium Carbonate-Vitamin D (CALCIUM 600 + D PO) Take by mouth 2 (two) times daily.        . Cholecalciferol (VITAMIN D) 1000 UNITS capsule Take 1,000 Units by mouth daily.        . fish oil-omega-3 fatty acids 1000 MG capsule Take 1,000 mg by mouth daily.        . meloxicam (MOBIC) 7.5 MG tablet Daily.      . Multiple Vitamin (MULTIVITAMIN PO) Take by mouth daily.        . Omeprazole (PRILOSEC PO) Take by mouth daily.        . hydrocortisone (ANUSOL-HC) 25 MG suppository Place 1 suppository (25 mg total) rectally 2 (two) times daily.  12 suppository  0    Review of Systems ROS Positive for rectal pain, arthritis pains Blood pressure  126/84, pulse 60, temperature 96.6 F (35.9 C), height 5\' 2"  (1.575 m), weight 149 lb (67.586 kg).  Physical Exam Physical Exam  WDWN in NAD  Rectal - small amount of purulent drainage, but unable to visualize any openings.  There is circumferential excoriation and desquamation of the perianal epidermis. The patient describes a burning itching sensation around this area. I did not see any enlarged external hemorrhoids. No palpable masses on digital rectal examination. Her anus is fairly small with a possible slight stricture. Data Reviewed   Assessment    Perianal irritation and pruritus ani, possibly from a small spontaneously ruptured perirectal abscess.    Plan    No indications at this time for any surgical intervention. I recommended using a fiber supplement to help regulate her bowel movements. We will use Anusol HC suppositories to help with the irritation. I also recommended sitz baths frequently. She should avoid the use of dry toilet paper and should use wet wipes. Followup as needed       TSUEI,MATTHEW K. 03/08/2011, 5:38 PM

## 2011-03-10 ENCOUNTER — Encounter: Payer: Self-pay | Admitting: Family Medicine

## 2011-03-14 ENCOUNTER — Encounter: Payer: Self-pay | Admitting: Family Medicine

## 2011-03-14 ENCOUNTER — Ambulatory Visit (INDEPENDENT_AMBULATORY_CARE_PROVIDER_SITE_OTHER): Payer: Medicare Other | Admitting: Family Medicine

## 2011-03-14 DIAGNOSIS — E785 Hyperlipidemia, unspecified: Secondary | ICD-10-CM

## 2011-03-14 DIAGNOSIS — K219 Gastro-esophageal reflux disease without esophagitis: Secondary | ICD-10-CM

## 2011-03-14 DIAGNOSIS — M949 Disorder of cartilage, unspecified: Secondary | ICD-10-CM

## 2011-03-14 DIAGNOSIS — M899 Disorder of bone, unspecified: Secondary | ICD-10-CM

## 2011-03-14 DIAGNOSIS — D126 Benign neoplasm of colon, unspecified: Secondary | ICD-10-CM

## 2011-03-14 DIAGNOSIS — Z23 Encounter for immunization: Secondary | ICD-10-CM

## 2011-03-14 DIAGNOSIS — I10 Essential (primary) hypertension: Secondary | ICD-10-CM

## 2011-03-14 MED ORDER — OMEPRAZOLE 20 MG PO CPDR: 20.0000 mg | DELAYED_RELEASE_CAPSULE | Freq: Every day | ORAL | Status: DC

## 2011-03-14 MED ORDER — AMLODIPINE BESY-BENAZEPRIL HCL 5-10 MG PO CAPS: 1.0000 | ORAL_CAPSULE | Freq: Every day | ORAL | Status: DC

## 2011-03-14 MED ORDER — MELOXICAM 7.5 MG PO TABS: 7.5000 mg | ORAL_TABLET | Freq: Every day | ORAL | Status: DC

## 2011-03-14 MED ORDER — ATORVASTATIN CALCIUM 20 MG PO TABS: 20.0000 mg | ORAL_TABLET | Freq: Every day | ORAL | Status: DC

## 2011-03-14 MED ORDER — FLUTICASONE PROPIONATE 50 MCG/ACT NA SUSP: 2.0000 | Freq: Every day | NASAL | Status: DC

## 2011-03-14 NOTE — Assessment & Plan Note (Signed)
Very good control with current meds  No problem with mix with statin  Will continue to watch  Disc need for exercise

## 2011-03-14 NOTE — Assessment & Plan Note (Signed)
Good control with lipitor and diet Rev lab with pt  Rev low sat fat diet

## 2011-03-14 NOTE — Progress Notes (Signed)
Subjective:    Patient ID: ARLYS SCATENA, female    DOB: 1945/09/28, 65 y.o.   MRN: [id number]  HPI Here for annual check up of chronic medical problems and also to review health mt list   No new medical issues Is trying - to take care of herself   Pneumovax - is open to getting  Flu shot - gets those every year - at different office   Mam 8/11- has appt upcoming this month  Self exam - no lumps or changes   Td 06  colonosc 09 with hyperplastic polyp- due 2014 due fo fam hx (father had that) No change in bowel habits  Uses stool softeners occ Did have a small rectal abcess that got better   zostavax 5/11  Wt is stable- she feels like she is gaining  Not serious about any wt loss efforts  Has to take care of her brother - and that is difficult  Has an exercicle she can use in the house    Had total hysterectomy No problems or issues   Lipids good on lipitor and diet Lab Results  Component Value Date   CHOL 154 03/08/2011   CHOL 179 11/24/2009   CHOL 169 08/13/2008   Lab Results  Component Value Date   HDL 73.40 03/08/2011   HDL 40.98 11/24/2009   HDL 11.9 08/13/2008   Lab Results  Component Value Date   LDLCALC 63 03/08/2011   LDLCALC 86 11/24/2009   LDLCALC 78 08/13/2008   Lab Results  Component Value Date   TRIG 87.0 03/08/2011   TRIG 98.0 11/24/2009   TRIG 99 08/13/2008   Lab Results  Component Value Date   CHOLHDL 2 03/08/2011   CHOLHDL 2 11/24/2009   CHOLHDL 2.4 CALC 08/13/2008   No results found for this basename: LDLDIRECT     HTN is in very good control with bp of 120/80 No ha or cp or palpitations  Osteopenia  Last dexa== last bone density ? - if it was within 2 years or not  Will let us know when it was and where it was  Ca and D  Vit D 52 --good   Patient Active Problem List  Diagnoses  . ADENOMATOUS COLONIC POLYP  . HYPERLIPIDEMIA  . HYPERTENSION  . ALLERGIC RHINITIS  . ESOPHAGITIS, REFLUX  . G E R D  . HIATAL HERNIA  . IBS  .  FIBROCYSTIC BREAST DISEASE  . OSTEOARTHRITIS, GENERALIZED, HAND  . OSTEOARTHRITIS  . ARTHRALGIA  . PATELLO-FEMORAL SYNDROME  . DISC DISEASE, LUMBAR  . ROTATOR CUFF SYNDROME, LEFT  . OSTEOPENIA  . STRESS INCONTINENCE  . UNSPECIFIED ESOPHAGITIS  . Pruritus ani   Past Medical History  Diagnosis Date  . Cataract   . Hyperlipidemia   . Arthritis   . Osteoporosis   . Rectal pain   . HTN (hypertension)   . OA (osteoarthritis)     hands  . GERD (gastroesophageal reflux disease)   . Allergic rhinitis   . Esophagitis   . HH (hiatus hernia)    Past Surgical History  Procedure Date  . Abdominal hysterectomy   . Hemorrhoid surgery   . Retinal laser surgery   . Breast cyst aspiration 1/03  . Cataract extraction 9/07  . Esophagogastroduodenoscopy 11/08  . Appendectomy   . Rectal sphincterotomy    History  Substance Use Topics  . Smoking status: Never Smoker   . Smokeless tobacco: Not on file  . Alcohol Use: No  Family History  Problem Relation Age of Onset  . Colon cancer Father   . Pneumonia Father   . Thyroid disease Mother   . Depression Mother   . Hypertension Mother   . Osteoporosis Mother   . Alcohol abuse Brother     terminal   . Alcohol abuse Brother   . Liver disease Brother     from alcohol  . Thyroid disease      half sister  . Breast cancer Maternal Aunt   . Colon cancer      Aunt   No Known Allergies Current Outpatient Prescriptions on File Prior to Visit  Medication Sig Dispense Refill  . amLODipine-benazepril (LOTREL) 5-10 MG per capsule TAKE 1 CAPSULE BY MOUTH DAILY.  30 capsule  0  . atorvastatin (LIPITOR) 20 MG tablet Take 1 tablet (20 mg total) by mouth daily.  30 tablet  0  . Calcium Carbonate-Vitamin D (CALCIUM 600 + D PO) Take by mouth 2 (two) times daily.        . Cholecalciferol (VITAMIN D) 1000 UNITS capsule Take 1,000 Units by mouth daily.       . fish oil-omega-3 fatty acids 1000 MG capsule Take 1,000 mg by mouth daily.        .  meloxicam (MOBIC) 7.5 MG tablet Daily.      . Multiple Vitamin (MULTIVITAMIN PO) Take by mouth daily.        . Omeprazole (PRILOSEC PO) Take by mouth daily.        . hydrocortisone (ANUSOL-HC) 25 MG suppository Place 1 suppository (25 mg total) rectally 2 (two) times daily.  12 suppository  0       Review of Systems Review of Systems  Constitutional: Negative for fever, appetite change, fatigue and unexpected weight change.  Eyes: Negative for pain and visual disturbance.  Respiratory: Negative for cough and shortness of breath.   Cardiovascular: Negative for cp or palpitations    Gastrointestinal: Negative for nausea, diarrhea and constipation.  Genitourinary: Negative for urgency and frequency.  Skin: Negative for pallor or rash   MSK had some hip pain after working in yard  Neurological: Negative for weakness, light-headedness, numbness and headaches.  Hematological: Negative for adenopathy. Does not bruise/bleed easily.  Psychiatric/Behavioral: Negative for dysphoric mood. The patient is not nervous/anxious.          Objective:   Physical Exam  Constitutional: She appears well-developed and well-nourished. No distress.       overwt and well appearing   HENT:  Head: Normocephalic and atraumatic.  Right Ear: External ear normal.  Left Ear: External ear normal.  Nose: Nose normal.  Mouth/Throat: Oropharynx is clear and moist.  Eyes: Conjunctivae and EOM are normal. Pupils are equal, round, and reactive to light.  Neck: Normal range of motion. Neck supple. No JVD present. Carotid bruit is not present. Erythema present. No thyromegaly present.  Cardiovascular: Normal rate, regular rhythm, normal heart sounds and intact distal pulses.   Pulmonary/Chest: Effort normal and breath sounds normal. No respiratory distress. She has no wheezes.  Abdominal: Soft. Bowel sounds are normal. She exhibits no distension, no abdominal bruit and no mass. There is no tenderness.  Genitourinary:  No breast swelling, tenderness, discharge or bleeding.  Musculoskeletal: Normal range of motion. She exhibits no edema and no tenderness.  Lymphadenopathy:    She has no cervical adenopathy.  Neurological: She is alert. She has normal reflexes. No cranial nerve deficit. Coordination normal.  Skin: Skin is warm  and dry. No rash noted. No erythema. No pallor.  Psychiatric: She has a normal mood and affect.          Assessment & Plan:

## 2011-03-14 NOTE — Patient Instructions (Signed)
Pneumonia vaccine today  Try to get 20-30 minutes of exercise 5 days per week Call and let us know when and where your last dexa was Avoid red meat/ fried foods/ egg yolks/ fatty breakfast meats/ butter, cheese and high fat dairy/ and shellfish   No change in medicines

## 2011-03-14 NOTE — Assessment & Plan Note (Signed)
Pt up to date on surveillance  Has fam hx colon cancer  Check in 2014 colonoscopy

## 2011-03-14 NOTE — Assessment & Plan Note (Signed)
Refilled omeprazole and doing well with it

## 2011-03-14 NOTE — Assessment & Plan Note (Signed)
On calcium and D with good D level  Pt will call with date of last dexa - we do not have that Want to keep up with 2 year f/u

## 2011-03-23 ENCOUNTER — Ambulatory Visit
Admission: RE | Admit: 2011-03-23 | Discharge: 2011-03-23 | Disposition: A | Discharge status: Home / Self Care | Payer: Medicare Other | Source: Ambulatory Visit | Attending: Family Medicine | Admitting: Family Medicine

## 2011-03-23 DIAGNOSIS — Z1231 Encounter for screening mammogram for malignant neoplasm of breast: Secondary | ICD-10-CM

## 2011-03-23 LAB — HM MAMMOGRAPHY: HM Mammogram: NORMAL

## 2011-05-26 ENCOUNTER — Encounter: Payer: Self-pay | Admitting: Family Medicine

## 2011-07-27 ENCOUNTER — Ambulatory Visit (INDEPENDENT_AMBULATORY_CARE_PROVIDER_SITE_OTHER): Payer: Medicare Other | Admitting: Family Medicine

## 2011-07-27 ENCOUNTER — Encounter: Payer: Self-pay | Admitting: Family Medicine

## 2011-07-27 VITALS — BP 132/70 | HR 64 | Temp 98.4°F | Ht 60.0 in | Wt 149.0 lb

## 2011-07-27 DIAGNOSIS — J069 Acute upper respiratory infection, unspecified: Secondary | ICD-10-CM

## 2011-07-27 DIAGNOSIS — J019 Acute sinusitis, unspecified: Secondary | ICD-10-CM

## 2011-07-27 MED ORDER — AMOXICILLIN 875 MG PO TABS: 875.0000 mg | ORAL_TABLET | Freq: Two times a day (BID) | ORAL | Status: AC

## 2011-07-27 NOTE — Patient Instructions (Signed)
URI, possibly early sinus infection. Supportive measures with sinus rinses (sinus rinse kit or Neti-pot) and mucinex (or other guaifenesin containing medication) If symptoms persist or worsen, start antibiotics to treat sinusitis. You must take all of the antibiotics if they are started.

## 2011-07-27 NOTE — Progress Notes (Signed)
Chief complaint:  3-4 days patient has been experiencing ST, swollen gland L side of neck, has sinus drainage and a tickle in her throat and coughing very little, just not feeling 100%  HPI:  3 days ago started with some tickle in her throat.  Having L sided nasal congestion.  Gradually getting worse--burning in throat, swollen L gland which is sore. Only gets mucus from nose in morning, doesn't look at it.  Burning when she talks.  Some dry cough.  Denies fevers, chest pain or congestion, shortness of breath. Having some headaches in L forehead and behind both eyes.  Past Medical History  Diagnosis Date  . Cataract   . Hyperlipidemia   . Arthritis   . Osteoporosis   . Rectal pain   . HTN (hypertension)   . OA (osteoarthritis)     hands  . GERD (gastroesophageal reflux disease)   . Allergic rhinitis   . Esophagitis   . HH (hiatus hernia)     Past Surgical History  Procedure Date  . Abdominal hysterectomy   . Hemorrhoid surgery   . Retinal laser surgery   . Breast cyst aspiration 1/03  . Cataract extraction 9/07  . Esophagogastroduodenoscopy 11/08  . Appendectomy   . Rectal sphincterotomy     History   Social History  . Marital Status: Married    Spouse Name: N/A    Number of Children: 2  . Years of Education: N/A   Occupational History  . Retired    Social History Main Topics  . Smoking status: Never Smoker   . Smokeless tobacco: Never Used  . Alcohol Use: No  . Drug Use: No  . Sexually Active: Not on file   Other Topics Concern  . Not on file   Social History Narrative   [employer] childrenTypist--retired 2010Lives with husband,  and cares for her brother--who had alcohol and strokeCared for elderly mother for years (she passed in 2009)    Family History  Problem Relation Age of Onset  . Colon cancer Father   . Pneumonia Father   . Thyroid disease Mother   . Depression Mother   . Hypertension Mother   . Osteoporosis Mother   . Alcohol abuse Brother    terminal   . Alcohol abuse Brother   . Liver disease Brother     from alcohol  . Thyroid disease      half sister  . Breast cancer Maternal Aunt   . Colon cancer      Aunt    Current outpatient prescriptions:amLODipine-benazepril (LOTREL) 5-10 MG per capsule, Take 1 capsule by mouth daily., Disp: 30 capsule, Rfl: 11;  atorvastatin (LIPITOR) 20 MG tablet, Take 1 tablet (20 mg total) by mouth daily., Disp: 30 tablet, Rfl: 11;  Calcium Carbonate (CALCIUM 600 PO), Take 1 capsule by mouth daily., Disp: , Rfl: ;  Cholecalciferol (VITAMIN D) 1000 UNITS capsule, Take 1,000 Units by mouth daily. , Disp: , Rfl:  fish oil-omega-3 fatty acids 1000 MG capsule, Take 2 g by mouth daily. , Disp: , Rfl: ;  meloxicam (MOBIC) 7.5 MG tablet, Take 1 tablet (7.5 mg total) by mouth daily. With food, Disp: 30 tablet, Rfl: 11;  Multiple Vitamin (MULTIVITAMIN PO), Take by mouth daily.  , Disp: , Rfl: ;  NON FORMULARY, Arthaffect (one scoop daily with apple juice) , Disp: , Rfl:  omeprazole (PRILOSEC) 20 MG capsule, Take 1 capsule (20 mg total) by mouth daily., Disp: 30 capsule, Rfl: 11;  fluticasone (FLONASE)  50 MCG/ACT nasal spray, Place 2 sprays into the nose daily., Disp: 16 g, Rfl: 11;  NON FORMULARY, Ketoprofen in Lidoderm 10% cream (apply to hands for arthritis) , Disp: , Rfl:   No Known Allergies  ROS: Denies fevers.  Slight nausea; no vomiting or diarrhea, skin rash, chest pain, shortness of breath or other concerns  PHYSICAL EXAM: BP 132/70  Pulse 64  Temp(Src) 98.4 F (36.9 C) (Oral)  Ht 5' (1.524 m)  Wt 149 lb (67.586 kg)  BMI 29.10 kg/m2 Well developed, pleasant female, in no distress HEENT:  PERRL, EOMI, conjunctiva clear. TM's and EAC's normal, only partially visualized due to cerumen OP clear; nasal mucosa mildly edematous, with yellow mucus on left.  Sinuses nontender Neck: no lymphadenopathy, slightly tender on left Heart: regular rate and rhythm without murmur Lungs: clear bilaterally Skin:  no rash  ASSESSMENT/PLAN: 1. URI (upper respiratory infection)    2. Sinusitis acute  amoxicillin (AMOXIL) 875 MG tablet   URI, possibly early sinus infection. Supportive measures with sinus rinses and mucinex If symptoms persist or worsen, start antibiotics to treat sinusitis.

## 2011-08-15 ENCOUNTER — Telehealth: Payer: Self-pay | Admitting: Family Medicine

## 2011-08-15 NOTE — Telephone Encounter (Signed)
Last seen 1/17.  Recommend OV for re-eval.  Many open appts on 2/6

## 2011-08-16 ENCOUNTER — Encounter: Payer: Self-pay | Admitting: Family Medicine

## 2011-08-16 ENCOUNTER — Ambulatory Visit (INDEPENDENT_AMBULATORY_CARE_PROVIDER_SITE_OTHER): Payer: Medicare Other | Admitting: Family Medicine

## 2011-08-16 DIAGNOSIS — J209 Acute bronchitis, unspecified: Secondary | ICD-10-CM

## 2011-08-16 DIAGNOSIS — R062 Wheezing: Secondary | ICD-10-CM

## 2011-08-16 MED ORDER — ALBUTEROL SULFATE (5 MG/ML) 0.5% IN NEBU: 2.5000 mg | INHALATION_SOLUTION | Freq: Once | RESPIRATORY_TRACT | Status: DC

## 2011-08-16 MED ORDER — AZITHROMYCIN 250 MG PO TABS: ORAL_TABLET | ORAL | Status: AC

## 2011-08-16 MED ORDER — METHYLPREDNISOLONE 4 MG PO KIT: PACK | ORAL | Status: AC

## 2011-08-16 NOTE — Progress Notes (Signed)
Chief complaint: seen 07/27/11, took Augmentin 875 x 10 days, felt better after antibiotics, but 3-4 days after finishing antibiotics she began feeling worse. Feels excessive fatigue and hot, the chills. Chest tightness, her GERD has been worse too with this, feels "sick on her stomach." Says she feels like she may have pneumonia(she has had before) Also has had a terrible HA.  HPI:  As above--treated for sinus infection with improvement in symptoms, but recurrent symptoms within 3-4 days of being off of medications.  Felt like she got a bad cold. Symptomatic again for about a week. She had a bad headache over the weekend, behind her eyes.  This has improved, better x 2 days.  This morning her chest is bothering her.  Having some coughing spells, but infrequent.  Feels like her cough is wet, but unable to get phlegm all the way up.  Mucus from nose has been clear.  Denies fevers.  This morning felt a little short of breath, just when sitting.  Denies dyspnea with exertion.  Feels weak today.  She is taking a generic mucinex with decongestant.  She feels that her hiatal hernia is being aggravated by whatever is going on in her chest.  She is taking Prilosec OTC once daily, and a second time only if needed. Has a lot of gas escaping from her mouth at night (not really belching).  Feels bloated.  Past Medical History  Diagnosis Date  . Cataract   . Hyperlipidemia   . Arthritis   . Osteoporosis   . Rectal pain   . HTN (hypertension)   . OA (osteoarthritis)     hands  . GERD (gastroesophageal reflux disease)   . Allergic rhinitis   . Esophagitis   . HH (hiatus hernia)     Past Surgical History  Procedure Date  . Abdominal hysterectomy   . Hemorrhoid surgery   . Retinal laser surgery   . Breast cyst aspiration 1/03  . Cataract extraction 9/07  . Esophagogastroduodenoscopy 11/08  . Appendectomy   . Rectal sphincterotomy     History   Social History  . Marital Status: Married    Spouse  Name: N/A    Number of Children: 2  . Years of Education: N/A   Occupational History  . Retired    Social History Main Topics  . Smoking status: Never Smoker   . Smokeless tobacco: Never Used  . Alcohol Use: No  . Drug Use: No  . Sexually Active: Not on file   Other Topics Concern  . Not on file   Social History Narrative   [employer] childrenTypist--retired 2010Lives with husband,  and cares for her brother--who had alcohol and strokeCared for elderly mother for years (she passed in 2009)   Current Outpatient Prescriptions on File Prior to Visit  Medication Sig Dispense Refill  . amLODipine-benazepril (LOTREL) 5-10 MG per capsule Take 1 capsule by mouth daily.  30 capsule  11  . atorvastatin (LIPITOR) 20 MG tablet Take 1 tablet (20 mg total) by mouth daily.  30 tablet  11  . Calcium Carbonate (CALCIUM 600 PO) Take 1 capsule by mouth daily.      . Cholecalciferol (VITAMIN D) 1000 UNITS capsule Take 1,000 Units by mouth daily.       . fish oil-omega-3 fatty acids 1000 MG capsule Take 2 g by mouth daily.       . meloxicam (MOBIC) 7.5 MG tablet Take 1 tablet (7.5 mg total) by mouth daily. With food  30 tablet  11  . Multiple Vitamin (MULTIVITAMIN PO) Take by mouth daily.        . NON FORMULARY Arthaffect (one scoop daily with apple juice)       . omeprazole (PRILOSEC) 20 MG capsule Take 1 capsule (20 mg total) by mouth daily.  30 capsule  11  . fluticasone (FLONASE) 50 MCG/ACT nasal spray Place 2 sprays into the nose daily.  16 g  11  . NON FORMULARY Ketoprofen in Lidoderm 10% cream (apply to hands for arthritis)        No current facility-administered medications on file prior to visit.   No Known Allergies  ROS:  See HPI.  Denies vomiting, diarrhea, skin rash.  PHYSICAL EXAM: BP 128/78  Pulse 68  Temp(Src) 98.3 F (36.8 C) (Oral)  Ht 5' (1.524 m)  Wt 144 lb (65.318 kg)  BMI 28.12 kg/m2 Well developed, somewhat pale appearing female, occasional cough, in no distress.   Doesn't sound nasal/congested HEENT:  PERRL, EOMI, conjunctiva clear.  Nasal mucosa mildly edematous, some bleeding on left.  No erythema or purulence.  Sinuses nontender.  OP clear. Mucus membranes moist. Tender at mid chest Abdomen: Tender at epigastrium, and LUQ, more by ribs. Soft, no organomegaly or mass Coarse breath sounds and wheezing, R>L.  After nebulizer treatment there was persistent wheezing and ronchi, but symmetric bilaterally with improved air movement.  No rales.  No respiratory distress Heart: regular rate and rhythm Skin: no rash  ASSESSMENT/PLAN: 1. Wheezing  methylPREDNISolone (MEDROL DOSEPAK) 4 MG tablet, albuterol (PROVENTIL) (5 MG/ML) 0.5% nebulizer solution 2.5 mg, PR INHAL RX, AIRWAY OBST/DX SPUTUM INDUCT, PR INHAL RX, AIRWAY OBST/DX SPUTUM INDUCT  2. Bronchitis, acute, with bronchospasm  azithromycin (ZITHROMAX) 250 MG tablet, methylPREDNISolone (MEDROL DOSEPAK) 4 MG tablet, albuterol (PROVENTIL) (5 MG/ML) 0.5% nebulizer solution 2.5 mg, PR INHAL RX, AIRWAY OBST/DX SPUTUM INDUCT, PR INHAL RX, AIRWAY OBST/DX SPUTUM INDUCT   Treat with z-pak and course of oral steroids.  Call for inhaler if ongoing shortness of breath/wheezing.  F/u next week if not improving

## 2011-08-16 NOTE — Patient Instructions (Signed)
Continue with expectorant (guaifenesin). Take medications as directed If you are having ongoing shortness of breath, despite the steroids, call for prescription for the inhaler

## 2011-08-16 NOTE — Telephone Encounter (Signed)
APPT MADE

## 2011-12-11 ENCOUNTER — Other Ambulatory Visit (HOSPITAL_COMMUNITY): Payer: Self-pay | Admitting: Chiropractic Medicine

## 2011-12-11 ENCOUNTER — Ambulatory Visit (HOSPITAL_COMMUNITY)
Admission: RE | Admit: 2011-12-11 | Discharge: 2011-12-11 | Disposition: A | Discharge status: Home / Self Care | Payer: Medicare Other | Source: Ambulatory Visit | Attending: Chiropractic Medicine | Admitting: Chiropractic Medicine

## 2011-12-11 DIAGNOSIS — M51379 Other intervertebral disc degeneration, lumbosacral region without mention of lumbar back pain or lower extremity pain: Secondary | ICD-10-CM | POA: Insufficient documentation

## 2011-12-11 DIAGNOSIS — M25559 Pain in unspecified hip: Secondary | ICD-10-CM

## 2011-12-11 DIAGNOSIS — M545 Low back pain, unspecified: Secondary | ICD-10-CM | POA: Insufficient documentation

## 2011-12-11 DIAGNOSIS — M5137 Other intervertebral disc degeneration, lumbosacral region: Secondary | ICD-10-CM | POA: Insufficient documentation

## 2011-12-11 DIAGNOSIS — M549 Dorsalgia, unspecified: Secondary | ICD-10-CM

## 2011-12-11 DIAGNOSIS — M4120 Other idiopathic scoliosis, site unspecified: Secondary | ICD-10-CM | POA: Insufficient documentation

## 2011-12-11 DIAGNOSIS — IMO0002 Reserved for concepts with insufficient information to code with codable children: Secondary | ICD-10-CM | POA: Insufficient documentation

## 2012-01-04 ENCOUNTER — Telehealth: Payer: Self-pay

## 2012-01-04 ENCOUNTER — Other Ambulatory Visit: Payer: Self-pay | Admitting: *Deleted

## 2012-01-04 MED ORDER — OMEPRAZOLE 20 MG PO CPDR: 20.0000 mg | DELAYED_RELEASE_CAPSULE | Freq: Every day | ORAL | Status: DC

## 2012-01-04 NOTE — Telephone Encounter (Signed)
Pt has been seeing chiropractor Dr Cher Nakai for lower back pain, Had xrays at [facility] with apparent old injury as child and arthritis. Daughter gave pt Tramadol 50 mg and that helped the pain. Pt request Tramadol called to [facility] [address] . Pt has CPX scheduled 06/04/12. Pt wants to wait for Dr Royden Purl return for answer.If condition changes or worsens pt will call back.

## 2012-01-06 MED ORDER — TRAMADOL HCL 50 MG PO TABS: 50.0000 mg | ORAL_TABLET | Freq: Three times a day (TID) | ORAL | Status: AC | PRN

## 2012-01-06 NOTE — Telephone Encounter (Signed)
I am fine with occasional use of tramadol- but note it is sedating and can be habit forming so use caution  Will send med electronically

## 2012-01-08 ENCOUNTER — Other Ambulatory Visit: Payer: Self-pay | Admitting: *Deleted

## 2012-01-08 MED ORDER — OMEPRAZOLE 20 MG PO CPDR: 20.0000 mg | DELAYED_RELEASE_CAPSULE | Freq: Every day | ORAL | Status: DC

## 2012-01-08 NOTE — Telephone Encounter (Signed)
Patient advised.

## 2012-01-08 NOTE — Telephone Encounter (Signed)
That is fine  Px written for call in   

## 2012-01-08 NOTE — Telephone Encounter (Signed)
Patient called stating that she wants the script for her Prilosec OTC changed to #84 instead of #30 because of the cost to her. Is it okay to refill medication as requested?

## 2012-01-09 NOTE — Telephone Encounter (Signed)
Called in Rx as directed.  

## 2012-02-21 ENCOUNTER — Ambulatory Visit (INDEPENDENT_AMBULATORY_CARE_PROVIDER_SITE_OTHER): Payer: Medicare Other | Admitting: Family Medicine

## 2012-02-21 VITALS — BP 120/74 | HR 68 | Temp 97.9°F | Ht 60.0 in | Wt 150.2 lb

## 2012-02-21 DIAGNOSIS — IMO0002 Reserved for concepts with insufficient information to code with codable children: Secondary | ICD-10-CM

## 2012-02-21 DIAGNOSIS — M549 Dorsalgia, unspecified: Secondary | ICD-10-CM

## 2012-02-21 MED ORDER — PREDNISONE 20 MG PO TABS: ORAL_TABLET | ORAL | Status: AC

## 2012-02-21 NOTE — Progress Notes (Signed)
Nature conservation officer at [facility] [address] [place] [address] Phone: [phone] Fax: [phone]  Date:  02/21/2012   Name:  Deborah Gardner   DOB:  09/07/1945   MRN:  [id number] Gender: female  Age: 66 y.o.  PCP:  Roxy Manns, MD    Chief Complaint: Back Pain and Hip Pain   History of Present Illness:  Deborah Gardner is a 66 y.o. pleasant patient who presents with the following:  Patient with significant history of back pain now with significant posterior R > L buttocks pain. Feeling some hot and a burning sensation, and sees Dr. Melvyn Neth the chiropractor. He took some back films and when seeing him for adjustments it would help him. R posterior buttocks pain. Sometimes in the left posteior buttocks. Sometimes a little bit of groin pain.   Does not go down legs.   Took some tramadol. Prn for pain. On top of that, will have to take some tylenol.  Has been on the heating pad.  Has been doing some manipulation only. It has helped some  No bowel or bladder incontinence.   Some tingling in distal fingers -    LUMBAR SPINE - COMPLETE 4+ VIEW   Comparison: None.   Findings: Mild scoliosis of the lumbar spine.  Advanced degenerative disc disease changes in the lower thoracic and upper lumbar spine.  Accentuation of the normal lumbar lordosis.  Disc space narrowing also noted at L5-S1.  Degenerative facet disease in the mid and lower lumbar spine.   Elongated calcification noted to the left of the L2 and L3 vertebral bodies of unknown etiology, possible phlebolith.  Cannot completely exclude urinary tract stone.  Recommend correlation for flank pain or hematuria.   IMPRESSION: Degenerative changes in the lumbar spine and lower thoracic spine as above.  No acute bony abnormality.   Elongated calcification to the left line as above.  I favor this represents a calcified phlebolith.  Recommend clinical correlation to exclude urinary tract stone.   Original  Report Authenticated By: Cyndie Chime, M.D.  - Independently reviewed and discussed with patient in the room. There are significant DDD changes and spondyloarthropathy changes throughout the lumbar spine worse in the upper lumbar and lower thoracic spine. Significant lordosis noted and some scoliosis.   Patient Active Problem List  Diagnosis  . ADENOMATOUS COLONIC POLYP  . HYPERLIPIDEMIA  . HYPERTENSION  . ALLERGIC RHINITIS  . ESOPHAGITIS, REFLUX  . G E R D  . HIATAL HERNIA  . IBS  . FIBROCYSTIC BREAST DISEASE  . OSTEOARTHRITIS, GENERALIZED, HAND  . OSTEOARTHRITIS  . ARTHRALGIA  . PATELLO-FEMORAL SYNDROME  . DISC DISEASE, LUMBAR  . ROTATOR CUFF SYNDROME, LEFT  . OSTEOPENIA  . STRESS INCONTINENCE  . UNSPECIFIED ESOPHAGITIS  . Pruritus ani    Past Medical History  Diagnosis Date  . Cataract   . Hyperlipidemia   . Arthritis   . Osteoporosis   . Rectal pain   . HTN (hypertension)   . OA (osteoarthritis)     hands  . GERD (gastroesophageal reflux disease)   . Allergic rhinitis   . Esophagitis   . HH (hiatus hernia)     Past Surgical History  Procedure Date  . Abdominal hysterectomy   . Hemorrhoid surgery   . Retinal laser surgery   . Breast cyst aspiration 1/03  . Cataract extraction 9/07  . Esophagogastroduodenoscopy 11/08  . Appendectomy   . Rectal sphincterotomy     History  Substance Use Topics  . Smoking status: Never Smoker   . Smokeless tobacco: Never Used  . Alcohol Use: No    Family History  Problem Relation Age of Onset  . Colon cancer Father   . Pneumonia Father   . Thyroid disease Mother   . Depression Mother   . Hypertension Mother   . Osteoporosis Mother   . Alcohol abuse Brother     terminal   . Alcohol abuse Brother   . Liver disease Brother     from alcohol  . Thyroid disease      half sister  . Breast cancer Maternal Aunt   . Colon cancer      Aunt    No Known Allergies  Medication list has been reviewed and  updated.  Current Outpatient Prescriptions on File Prior to Visit  Medication Sig Dispense Refill  . acetaminophen (TYLENOL) 500 MG tablet Take 1,000 mg by mouth every 6 (six) hours as needed.      Marland Kitchen amLODipine-benazepril (LOTREL) 5-10 MG per capsule Take 1 capsule by mouth daily.  30 capsule  11  . atorvastatin (LIPITOR) 20 MG tablet Take 1 tablet (20 mg total) by mouth daily.  30 tablet  11  . Calcium Carbonate (CALCIUM 600 PO) Take 1 capsule by mouth daily.      . Cholecalciferol (VITAMIN D) 1000 UNITS capsule Take 1,000 Units by mouth daily.       . fish oil-omega-3 fatty acids 1000 MG capsule Take 2 g by mouth daily.       . fluticasone (FLONASE) 50 MCG/ACT nasal spray Place 2 sprays into the nose daily.  16 g  11  . GuaiFENesin (MUCINEX PO) Take 1 tablet by mouth 4 (four) times daily.      . meloxicam (MOBIC) 7.5 MG tablet Take 1 tablet (7.5 mg total) by mouth daily. With food  30 tablet  11  . Multiple Vitamin (MULTIVITAMIN PO) Take by mouth daily.        . NON FORMULARY Arthaffect (one scoop daily with apple juice)       . NON FORMULARY Ketoprofen in Lidoderm 10% cream (apply to hands for arthritis)       . omeprazole (PRILOSEC) 20 MG capsule Take 1 capsule (20 mg total) by mouth daily.  84 capsule  3  . SALINE NASAL SPRAY NA Place 1-2 sprays into the nose as needed.       Current Facility-Administered Medications on File Prior to Visit  Medication Dose Route Frequency Provider Last Rate Last Dose  . albuterol (PROVENTIL) (5 MG/ML) 0.5% nebulizer solution 2.5 mg  2.5 mg Nebulization Once Joselyn Arrow, MD        Review of Systems:   GEN: No fevers, chills. Nontoxic. Primarily MSK c/o today. MSK: Detailed in the HPI GI: tolerating PO intake without difficulty Neuro: detailed above Otherwise the pertinent positives of the ROS are noted above.    Physical Examination: Filed Vitals:   02/21/12 1210  BP: 120/74  Pulse: 68  Temp: 97.9 F (36.6 C)   Filed Vitals:   02/21/12  1210  Height: 5' (1.524 m)  Weight: 150 lb 4 oz (68.153 kg)   Body mass index is 29.34 kg/(m^2). Ideal Body Weight: Weight in (lb) to have BMI = 25: 127.7    GEN: Well-developed,well-nourished,in no acute distress; alert,appropriate and cooperative throughout examination HEENT: Normocephalic and atraumatic without obvious abnormalities. Ears, externally no deformities PULM: Breathing comfortably in no respiratory distress EXT: No  clubbing, cyanosis, or edema PSYCH: Normally interactive. Cooperative during the interview. Pleasant. Friendly and conversant. Not anxious or depressed appearing. Normal, full affect.  Range of motion at  the waist: Flexion, extension, lateral bending and rotation: preserved  No echymosis or edema Rises to examination table with mild difficulty Gait: minimally antalgic  Inspection/Deformity: N Paraspinus Tenderness: diffuse l2-s1  B Ankle Dorsiflexion (L5,4): 5/5 B Great Toe Dorsiflexion (L5,4): 5/5 Heel Walk (L5): WNL Toe Walk (S1): WNL Rise/Squat (L4): WNL, mild pain  SENSORY B Medial Foot (L4): WNL B Dorsum (L5): WNL B Lateral (S1): WNL Light Touch: WNL Pinprick: WNL  REFLEXES Knee (L4): 2+ Ankle (S1): 2+  B SLR, seated: neg B SLR, supine: neg B FABER: neg B Reverse FABER: neg B Greater Troch: NT B Log Roll: neg B Stork: NT B Sciatic Notch: TTP  Hip ROM excellent with no pain  Assessment and Plan:  1. Back pain  Ambulatory referral to Physical Therapy  2. Degenerative disk disease     Challenging case with severe DDD and spondyloarthropathy. Core weakness around spine - rehab this area may help.  Pred burst and taper to try to get acute symptoms under control. Manipulation is reasonable.  Prognosis is fair for likely improvement.  Orders Today:  Orders Placed This Encounter  Procedures  . Ambulatory referral to Physical Therapy    Referral Priority:  Routine    Referral Type:  Physical Medicine    Referral Reason:   Specialty Services Required    Requested Specialty:  Physical Therapy    Number of Visits Requested:  1    Medications Today: (Includes new updates added during medication reconciliation) Meds ordered this encounter  Medications  . predniSONE (DELTASONE) 20 MG tablet    Sig: 2 tabs once a day po for 1 week, then 1 po daily for 5 days    Dispense:  19 tablet    Refill:  0    Medications Discontinued: Medications Discontinued During This Encounter  Medication Reason  . albuterol (PROVENTIL) (5 MG/ML) 0.5% nebulizer solution 2.5 mg      Hannah Beat, MD

## 2012-02-21 NOTE — Patient Instructions (Addendum)
F/u 5 weeks

## 2012-02-24 ENCOUNTER — Encounter: Payer: Self-pay | Admitting: Family Medicine

## 2012-03-25 ENCOUNTER — Other Ambulatory Visit: Payer: Self-pay | Admitting: *Deleted

## 2012-03-25 MED ORDER — AMLODIPINE BESY-BENAZEPRIL HCL 5-10 MG PO CAPS: 1.0000 | ORAL_CAPSULE | Freq: Every day | ORAL | Status: DC

## 2012-03-25 MED ORDER — MELOXICAM 7.5 MG PO TABS: 7.5000 mg | ORAL_TABLET | Freq: Every day | ORAL | Status: DC

## 2012-03-25 MED ORDER — FLUTICASONE PROPIONATE 50 MCG/ACT NA SUSP: 2.0000 | Freq: Every day | NASAL | Status: DC

## 2012-03-27 ENCOUNTER — Ambulatory Visit: Payer: Medicare Other | Admitting: Family Medicine

## 2012-03-28 ENCOUNTER — Ambulatory Visit: Payer: Medicare Other | Admitting: Family Medicine

## 2012-03-28 ENCOUNTER — Encounter: Payer: Self-pay | Admitting: Family Medicine

## 2012-03-28 ENCOUNTER — Ambulatory Visit (INDEPENDENT_AMBULATORY_CARE_PROVIDER_SITE_OTHER): Payer: Medicare Other | Admitting: Family Medicine

## 2012-03-28 VITALS — BP 140/80 | HR 68 | Temp 98.1°F | Ht 60.0 in | Wt 149.0 lb

## 2012-03-28 DIAGNOSIS — L02219 Cutaneous abscess of trunk, unspecified: Secondary | ICD-10-CM

## 2012-03-28 DIAGNOSIS — L03319 Cellulitis of trunk, unspecified: Secondary | ICD-10-CM

## 2012-03-28 DIAGNOSIS — Z23 Encounter for immunization: Secondary | ICD-10-CM

## 2012-03-28 MED ORDER — INFLUENZA VIRUS VACC SPLIT PF IM SUSP: 0.5000 mL | Freq: Once | INTRAMUSCULAR | Status: DC

## 2012-03-28 MED ORDER — CEPHALEXIN 500 MG PO CAPS: 500.0000 mg | ORAL_CAPSULE | Freq: Three times a day (TID) | ORAL | Status: DC

## 2012-03-28 NOTE — Progress Notes (Signed)
Chief Complaint  Patient presents with  . Advice Only    under right breast she has a bump that she would like you to look at, also a few spots in between breast area and one on her left side of neck. Would like a high dose flu vaccine but wants to wait to see you.   HPI: She presents accompanied by her husband, with complaint of painful, enlarging mole under her right breast. She had a red mole under her right breast for as long as she can remember.  Over the last 3 weeks, the mole got more puffy, and rather than being flat, started getting very raised.  She has noticed an odor recently.  It was burning and stinging, but that has gotten better in the last day or two.  Gets irritated by her bra.    Denies fevers.  Denies any drainage/weeping from the area.  Past Medical History  Diagnosis Date  . Cataract   . Hyperlipidemia   . Arthritis   . Osteoporosis   . Rectal pain   . HTN (hypertension)   . OA (osteoarthritis)     hands  . GERD (gastroesophageal reflux disease)   . Allergic rhinitis   . Esophagitis   . HH (hiatus hernia)    Past Surgical History  Procedure Date  . Abdominal hysterectomy   . Hemorrhoid surgery   . Retinal laser surgery   . Breast cyst aspiration 1/03  . Cataract extraction 9/07  . Esophagogastroduodenoscopy 11/08  . Appendectomy   . Rectal sphincterotomy    Current Outpatient Prescriptions on File Prior to Visit  Medication Sig Dispense Refill  . amLODipine-benazepril (LOTREL) 5-10 MG per capsule Take 1 capsule by mouth daily.  30 capsule  1  . Calcium Carbonate (CALCIUM 600 PO) Take 1 capsule by mouth daily.      . Cholecalciferol (VITAMIN D) 1000 UNITS capsule Take 1,000 Units by mouth daily.       . fish oil-omega-3 fatty acids 1000 MG capsule Take 2 g by mouth daily.       . fluticasone (FLONASE) 50 MCG/ACT nasal spray Place 2 sprays into the nose daily.  16 g  1  . meloxicam (MOBIC) 7.5 MG tablet Take 1 tablet (7.5 mg total) by mouth daily. With  food  30 tablet  1  . Multiple Vitamin (MULTIVITAMIN PO) Take by mouth daily.        . NON FORMULARY Arthaffect (one scoop daily with apple juice)       . omeprazole (PRILOSEC) 20 MG capsule Take 1 capsule (20 mg total) by mouth daily.  84 capsule  3  . acetaminophen (TYLENOL) 500 MG tablet Take 1,000 mg by mouth every 6 (six) hours as needed.       No Known Allergies  ROS:  Denies fevers, URI symptoms, chest pain, other skin concerns  BP 140/80  Pulse 68  Temp 98.1 F (36.7 C) (Oral)  Ht 5' (1.524 m)  Wt 149 lb (67.586 kg)  BMI 29.10 kg/m2 Well developed, pleasant female in no distress Below right breast is an inflamed papule, with surrounding erythema.  Papule itself is tender to palpation.  Medially there appeared to be a pustular portion, appearing white, but no significant fluctuance. There is a firmness within the papule, and some bluish and red discoloration.  ASSESSMENT/PLAN:  1. Cellulitis/abscess - trunk  cephALEXin (KEFLEX) 500 MG capsule, PR PUNCTURE DRAINAGE OF LESION  2. 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  This leads me to suspect possible clot/thrombosis within the angioma, although clinically it appears there is infection.  Given the tenderness and possible purulence, patient elected to proceed with trial of incision and drainage.  Area was prepped with betadine x 3, anesthetized with 2% lidocaine.  11 blade was use to incise the medial portion of lesion, but no purulence was noted, just bleeding.  Unable to extract any clot or pus.  Patient tolerated the procedure well.  Wound was dressed with antibacterial ointment and dressing, and there was no significant ongoing bleeding or blood loss  Treat the cellulitis with keflex.  Recommended warm compresses.  Return for re-evaluation next  week, although if clinically improving, can wait until her appointment with Dr. Terri Piedra in October.

## 2012-03-28 NOTE — Patient Instructions (Addendum)
Take the antibiotics as directed. Use antibacterial ointment 2-3 times daily over the area until the skin has healed.  Return if fevers, increased redness, pain, drainage.  You can return here for a recheck in 1-2 weeks, or if doing well, can just wait for your routine appointment with the dermatologist

## 2012-04-04 ENCOUNTER — Other Ambulatory Visit: Payer: Self-pay | Admitting: Family Medicine

## 2012-05-14 ENCOUNTER — Other Ambulatory Visit: Payer: Self-pay | Admitting: Family Medicine

## 2012-05-14 DIAGNOSIS — Z1231 Encounter for screening mammogram for malignant neoplasm of breast: Secondary | ICD-10-CM

## 2012-05-24 ENCOUNTER — Other Ambulatory Visit: Payer: Self-pay | Admitting: *Deleted

## 2012-05-24 MED ORDER — AMLODIPINE BESY-BENAZEPRIL HCL 5-10 MG PO CAPS: 1.0000 | ORAL_CAPSULE | Freq: Every day | ORAL | Status: DC

## 2012-05-24 MED ORDER — MELOXICAM 7.5 MG PO TABS: 7.5000 mg | ORAL_TABLET | Freq: Every day | ORAL | Status: DC

## 2012-05-24 NOTE — Addendum Note (Signed)
Addended by: Liane Comber C on: 05/24/2012 11:25 AM   Modules accepted: Orders

## 2012-06-03 ENCOUNTER — Encounter: Payer: Medicare Other | Admitting: Family Medicine

## 2012-06-04 ENCOUNTER — Encounter: Payer: Medicare Other | Admitting: Family Medicine

## 2012-06-05 ENCOUNTER — Other Ambulatory Visit: Payer: Self-pay | Admitting: Family Medicine

## 2012-06-12 ENCOUNTER — Telehealth: Payer: Self-pay | Admitting: Family Medicine

## 2012-06-12 DIAGNOSIS — E785 Hyperlipidemia, unspecified: Secondary | ICD-10-CM

## 2012-06-12 DIAGNOSIS — K219 Gastro-esophageal reflux disease without esophagitis: Secondary | ICD-10-CM

## 2012-06-12 DIAGNOSIS — M899 Disorder of bone, unspecified: Secondary | ICD-10-CM

## 2012-06-12 DIAGNOSIS — I10 Essential (primary) hypertension: Secondary | ICD-10-CM

## 2012-06-12 NOTE — Telephone Encounter (Signed)
Message copied by Judy Pimple on Wed Jun 12, 2012  9:04 PM ------      Message from: Alvina Chou      Created: Mon Jun 10, 2012  4:39 PM      Regarding: Lab orders for Thursday, 12.5.13       Patient is scheduled for CPX labs, please order future labs, Thanks , Camelia Eng

## 2012-06-13 ENCOUNTER — Other Ambulatory Visit (INDEPENDENT_AMBULATORY_CARE_PROVIDER_SITE_OTHER): Payer: Medicare Other

## 2012-06-13 DIAGNOSIS — M949 Disorder of cartilage, unspecified: Secondary | ICD-10-CM

## 2012-06-13 DIAGNOSIS — I10 Essential (primary) hypertension: Secondary | ICD-10-CM

## 2012-06-13 DIAGNOSIS — K219 Gastro-esophageal reflux disease without esophagitis: Secondary | ICD-10-CM

## 2012-06-13 DIAGNOSIS — E785 Hyperlipidemia, unspecified: Secondary | ICD-10-CM

## 2012-06-13 DIAGNOSIS — M899 Disorder of bone, unspecified: Secondary | ICD-10-CM

## 2012-06-13 LAB — COMPREHENSIVE METABOLIC PANEL
Albumin: 4.4 g/dL (ref 3.5–5.2)
BUN: 14 mg/dL (ref 6–23)
Calcium: 9.9 mg/dL (ref 8.4–10.5)
Chloride: 105 mEq/L (ref 96–112)
Glucose, Bld: 98 mg/dL (ref 70–99)
Potassium: 4.7 mEq/L (ref 3.5–5.1)

## 2012-06-13 LAB — CBC WITH DIFFERENTIAL/PLATELET
Basophils Relative: 0.7 % (ref 0.0–3.0)
Eosinophils Relative: 2.9 % (ref 0.0–5.0)
Hemoglobin: 13.2 g/dL (ref 12.0–15.0)
MCV: 91.4 fl (ref 78.0–100.0)
Monocytes Absolute: 0.7 10*3/uL (ref 0.1–1.0)
Neutro Abs: 3.9 10*3/uL (ref 1.4–7.7)
Neutrophils Relative %: 57.6 % (ref 43.0–77.0)
RBC: 4.4 Mil/uL (ref 3.87–5.11)
WBC: 6.7 10*3/uL (ref 4.5–10.5)

## 2012-06-13 LAB — LIPID PANEL
Cholesterol: 154 mg/dL (ref 0–200)
Triglycerides: 102 mg/dL (ref 0.0–149.0)

## 2012-06-18 ENCOUNTER — Ambulatory Visit (INDEPENDENT_AMBULATORY_CARE_PROVIDER_SITE_OTHER): Payer: Medicare Other | Admitting: Family Medicine

## 2012-06-18 ENCOUNTER — Encounter: Payer: Self-pay | Admitting: Family Medicine

## 2012-06-18 VITALS — BP 130/78 | HR 74 | Temp 98.7°F | Ht 60.5 in | Wt 145.8 lb

## 2012-06-18 DIAGNOSIS — R7989 Other specified abnormal findings of blood chemistry: Secondary | ICD-10-CM

## 2012-06-18 DIAGNOSIS — R946 Abnormal results of thyroid function studies: Secondary | ICD-10-CM

## 2012-06-18 DIAGNOSIS — E0590 Thyrotoxicosis, unspecified without thyrotoxic crisis or storm: Secondary | ICD-10-CM | POA: Insufficient documentation

## 2012-06-18 DIAGNOSIS — I10 Essential (primary) hypertension: Secondary | ICD-10-CM

## 2012-06-18 DIAGNOSIS — M899 Disorder of bone, unspecified: Secondary | ICD-10-CM

## 2012-06-18 DIAGNOSIS — Z1331 Encounter for screening for depression: Secondary | ICD-10-CM

## 2012-06-18 DIAGNOSIS — E785 Hyperlipidemia, unspecified: Secondary | ICD-10-CM

## 2012-06-18 DIAGNOSIS — M949 Disorder of cartilage, unspecified: Secondary | ICD-10-CM

## 2012-06-18 LAB — TSH: TSH: 0.21 u[IU]/mL — ABNORMAL LOW (ref 0.35–5.50)

## 2012-06-18 LAB — T4, FREE: Free T4: 0.91 ng/dL (ref 0.60–1.60)

## 2012-06-18 MED ORDER — MELOXICAM 7.5 MG PO TABS: 7.5000 mg | ORAL_TABLET | Freq: Every day | ORAL | Status: DC

## 2012-06-18 MED ORDER — FLUTICASONE PROPIONATE 50 MCG/ACT NA SUSP: 2.0000 | Freq: Every day | NASAL | Status: DC

## 2012-06-18 MED ORDER — ATORVASTATIN CALCIUM 20 MG PO TABS: 20.0000 mg | ORAL_TABLET | Freq: Every day | ORAL | Status: DC

## 2012-06-18 MED ORDER — AMLODIPINE BESY-BENAZEPRIL HCL 5-10 MG PO CAPS: 1.0000 | ORAL_CAPSULE | Freq: Every day | ORAL | Status: DC

## 2012-06-18 MED ORDER — OMEPRAZOLE 20 MG PO CPDR: 20.0000 mg | DELAYED_RELEASE_CAPSULE | Freq: Every day | ORAL | Status: DC

## 2012-06-18 NOTE — Patient Instructions (Addendum)
Thyroid number is a little off so I'm checking a thyroid profile today We will schedule a bone density test at check out  Take care of yourself

## 2012-06-18 NOTE — Progress Notes (Signed)
Subjective:    Patient ID: Deborah Gardner, female    DOB: 1946-01-03, 66 y.o.   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 with some scoliosis  Lots going on at home  Trying to place her brother somewhere- caring for him  She feels her age  No time to care for herself - but generally she thinks she is in good shape for her age   [place] is down 4 lb with bmi of 27 She wants to loose more   bp is stable today  No cp or palpitations or headaches or edema  No side effects to medicines  BP Readings from Last 3 Encounters:  06/18/12 130/78  03/28/12 140/80  02/21/12 120/74      Lab Results  Component Value Date   CHOL 154 06/13/2012   CHOL 154 03/08/2011   CHOL 179 11/24/2009   Lab Results  Component Value Date   HDL 69.90 06/13/2012   HDL 40.98 03/08/2011   HDL 11.91 11/24/2009   Lab Results  Component Value Date   LDLCALC 64 06/13/2012   LDLCALC 63 03/08/2011   LDLCALC 86 11/24/2009   Lab Results  Component Value Date   TRIG 102.0 06/13/2012   TRIG 87.0 03/08/2011   TRIG 98.0 11/24/2009   Lab Results  Component Value Date   CHOLHDL 2 06/13/2012   CHOLHDL 2 03/08/2011   CHOLHDL 2 11/24/2009   No results found for this basename: LDLDIRECT   on lipitor and diet  OP 6/11 dexa  Ca and D Vit D level is good at 62   mammo 9/12- is sched for mammo 12/19 Self exam-no changes  Had a mole under her breast- that got infected- had to have it lanced (underwire bra rubbed it) Then Dr Terri Piedra removed it   Had hyst in past  No gyn problems at all No abn pap smears   colonosc 5/09- recall will be 5/14  Father had colon cancer  Falls-none at all   Mood/ dep screen  Despite stress - her mood and attitude are good Still enjoys things and not depressed or down  Patient Active Problem List  Diagnosis  . ADENOMATOUS  COLONIC POLYP  . HYPERLIPIDEMIA  . HYPERTENSION  . ALLERGIC RHINITIS  . ESOPHAGITIS, REFLUX  . G E R D  . HIATAL HERNIA  . IBS  . FIBROCYSTIC BREAST DISEASE  . OSTEOARTHRITIS, GENERALIZED, HAND  . OSTEOARTHRITIS  . ARTHRALGIA  . PATELLO-FEMORAL SYNDROME  . DISC DISEASE, LUMBAR  . ROTATOR CUFF SYNDROME, LEFT  . OSTEOPENIA  . STRESS INCONTINENCE  . UNSPECIFIED ESOPHAGITIS  . Pruritus ani   Past Medical History  Diagnosis Date  . Cataract   . Hyperlipidemia   . Arthritis   . Osteoporosis   . Rectal pain   . HTN (hypertension)   . OA (osteoarthritis)     hands  . GERD (gastroesophageal reflux disease)   . Allergic rhinitis   . Esophagitis   . HH (hiatus hernia)    Past Surgical History  Procedure Date  . Abdominal hysterectomy   . Hemorrhoid surgery   . Retinal laser surgery   . Breast cyst aspiration 1/03  . Cataract extraction 9/07  . Esophagogastroduodenoscopy 11/08  . Appendectomy   . Rectal sphincterotomy  History  Substance Use Topics  . Smoking status: Never Smoker   . Smokeless tobacco: Never Used  . Alcohol Use: No   Family History  Problem Relation Age of Onset  . Colon cancer Father   . Pneumonia Father   . Thyroid disease Mother   . Depression Mother   . Hypertension Mother   . Osteoporosis Mother   . Alcohol abuse Brother     terminal   . Alcohol abuse Brother   . Liver disease Brother     from alcohol  . Thyroid disease      half sister  . Breast cancer Maternal Aunt   . Colon cancer      Aunt   No Known Allergies Current Outpatient Prescriptions on File Prior to Visit  Medication Sig Dispense Refill  . acetaminophen (TYLENOL) 500 MG tablet Take 1,000 mg by mouth every 6 (six) hours as needed.      Marland Kitchen amLODipine-benazepril (LOTREL) 5-10 MG per capsule Take 1 capsule by mouth daily.  30 capsule  6  . atorvastatin (LIPITOR) 20 MG tablet TAKE 1 TABLET BY MOUTH EVERY DAY  30 tablet  0  . Calcium Carbonate (CALCIUM 600 PO) Take 1  capsule by mouth daily.      . Cholecalciferol (VITAMIN D) 1000 UNITS capsule Take 1,000 Units by mouth daily.       . fish oil-omega-3 fatty acids 1000 MG capsule Take 2 g by mouth daily.       . fluticasone (FLONASE) 50 MCG/ACT nasal spray Place 2 sprays into the nose daily.  16 g  1  . meloxicam (MOBIC) 7.5 MG tablet Take 1 tablet (7.5 mg total) by mouth daily. With food  30 tablet  1  . Multiple Vitamin (MULTIVITAMIN PO) Take by mouth daily.        . NON FORMULARY Arthaffect (one scoop daily with apple juice)       . omeprazole (PRILOSEC) 20 MG capsule Take 1 capsule (20 mg total) by mouth daily.  84 capsule  3  . traMADol (ULTRAM) 50 MG tablet Take 50 mg by mouth as needed.        Lab Results  Component Value Date   TSH 0.32* 06/13/2012     Patient Active Problem List  Diagnosis  . ADENOMATOUS COLONIC POLYP  . HYPERLIPIDEMIA  . HYPERTENSION  . ALLERGIC RHINITIS  . ESOPHAGITIS, REFLUX  . G E R D  . HIATAL HERNIA  . IBS  . FIBROCYSTIC BREAST DISEASE  . OSTEOARTHRITIS, GENERALIZED, HAND  . OSTEOARTHRITIS  . ARTHRALGIA  . PATELLO-FEMORAL SYNDROME  . DISC DISEASE, LUMBAR  . ROTATOR CUFF SYNDROME, LEFT  . OSTEOPENIA  . STRESS INCONTINENCE  . UNSPECIFIED ESOPHAGITIS  . Pruritus ani   Past Medical History  Diagnosis Date  . Cataract   . Hyperlipidemia   . Arthritis   . Osteoporosis   . Rectal pain   . HTN (hypertension)   . OA (osteoarthritis)     hands  . GERD (gastroesophageal reflux disease)   . Allergic rhinitis   . Esophagitis   . HH (hiatus hernia)    Past Surgical History  Procedure Date  . Abdominal hysterectomy   . Hemorrhoid surgery   . Retinal laser surgery   . Breast cyst aspiration 1/03  . Cataract extraction 9/07  . Esophagogastroduodenoscopy 11/08  . Appendectomy   . Rectal sphincterotomy    History  Substance Use Topics  . Smoking status: Never Smoker   .  Smokeless tobacco: Never Used  . Alcohol Use: No   Family History  Problem  Relation Age of Onset  . Colon cancer Father   . Pneumonia Father   . Thyroid disease Mother   . Depression Mother   . Hypertension Mother   . Osteoporosis Mother   . Alcohol abuse Brother     terminal   . Alcohol abuse Brother   . Liver disease Brother     from alcohol  . Thyroid disease      half sister  . Breast cancer Maternal Aunt   . Colon cancer      Aunt   No Known Allergies Current Outpatient Prescriptions on File Prior to Visit  Medication Sig Dispense Refill  . acetaminophen (TYLENOL) 500 MG tablet Take 1,000 mg by mouth every 6 (six) hours as needed.      Marland Kitchen amLODipine-benazepril (LOTREL) 5-10 MG per capsule Take 1 capsule by mouth daily.  30 capsule  6  . atorvastatin (LIPITOR) 20 MG tablet TAKE 1 TABLET BY MOUTH EVERY DAY  30 tablet  0  . Calcium Carbonate (CALCIUM 600 PO) Take 1 capsule by mouth daily.      . Cholecalciferol (VITAMIN D) 1000 UNITS capsule Take 1,000 Units by mouth daily.       . fish oil-omega-3 fatty acids 1000 MG capsule Take 2 g by mouth daily.       . fluticasone (FLONASE) 50 MCG/ACT nasal spray Place 2 sprays into the nose daily.  16 g  1  . meloxicam (MOBIC) 7.5 MG tablet Take 1 tablet (7.5 mg total) by mouth daily. With food  30 tablet  1  . Multiple Vitamin (MULTIVITAMIN PO) Take by mouth daily.        . NON FORMULARY Arthaffect (one scoop daily with apple juice)       . omeprazole (PRILOSEC) 20 MG capsule Take 1 capsule (20 mg total) by mouth daily.  84 capsule  3  . traMADol (ULTRAM) 50 MG tablet Take 50 mg by mouth as needed.          Review of Systems Review of Systems  Constitutional: Negative for fever, appetite change, fatigue and unexpected weight change.  Eyes: Negative for pain and visual disturbance.  Respiratory: Negative for cough and shortness of breath.   Cardiovascular: Negative for cp or palpitations    Gastrointestinal: Negative for nausea, diarrhea and constipation.  Genitourinary: Negative for urgency and frequency.   Skin: Negative for pallor or rash   Neurological: Negative for weakness, light-headedness, numbness and headaches.  Hematological: Negative for adenopathy. Does not bruise/bleed easily.  Psychiatric/Behavioral: Negative for dysphoric mood. The patient is not nervous/anxious.         Objective:   Physical Exam  Constitutional: She appears well-developed and well-nourished. No distress.  HENT:  Head: Normocephalic and atraumatic.  Right Ear: External ear normal.  Left Ear: External ear normal.  Nose: Nose normal.  Mouth/Throat: Oropharynx is clear and moist.  Eyes: Conjunctivae normal and EOM are normal. Pupils are equal, round, and reactive to light. Right eye exhibits no discharge. Left eye exhibits no discharge. No scleral icterus.  Neck: Normal range of motion. Neck supple. No JVD present. Carotid bruit is not present. No thyromegaly present.  Cardiovascular: Normal rate, regular rhythm, normal heart sounds and intact distal pulses.  Exam reveals no gallop.   Pulmonary/Chest: Effort normal and breath sounds normal. No respiratory distress. She has no wheezes.  Abdominal: Soft. Bowel sounds are normal.  She exhibits no distension, no abdominal bruit and no mass. There is no tenderness.  Genitourinary: No breast swelling, tenderness, discharge or bleeding.       Breast exam: No mass, nodules, thickening, tenderness, bulging, retraction, inflamation, nipple discharge or skin changes noted.  No axillary or clavicular LA.  Chaperoned exam.    Musculoskeletal: She exhibits no edema and no tenderness.  Lymphadenopathy:    She has no cervical adenopathy.  Neurological: She is alert. She has normal reflexes. No cranial nerve deficit. She exhibits normal muscle tone. Coordination normal.  Skin: Skin is warm and dry. No rash noted. No erythema. No pallor.  Psychiatric: She has a normal mood and affect.          Assessment & Plan:

## 2012-06-20 ENCOUNTER — Telehealth: Payer: Self-pay | Admitting: Family Medicine

## 2012-06-20 DIAGNOSIS — R7989 Other specified abnormal findings of blood chemistry: Secondary | ICD-10-CM

## 2012-06-20 NOTE — Assessment & Plan Note (Signed)
Lipids reviewed Disc goals for lipids and reasons to control them Rev labs with pt Rev low sat fat diet in detail  Lab Results  Component Value Date   CHOL 154 06/13/2012   HDL 69.90 06/13/2012   LDLCALC 64 06/13/2012   TRIG 102.0 06/13/2012   CHOLHDL 2 06/13/2012

## 2012-06-20 NOTE — Telephone Encounter (Signed)
Message copied by Judy Pimple on Thu Jun 20, 2012  4:34 PM ------      Message from: Shon Millet      Created: Thu Jun 20, 2012  8:50 AM       Pt notified of lab results and agrees to see a endocrinologist, needs referral

## 2012-06-20 NOTE — Assessment & Plan Note (Addendum)
Disc ca and D and need for exercise No fragility fractures dexa ordered for 2 year re check

## 2012-06-20 NOTE — Assessment & Plan Note (Signed)
bp in fair control at this time  No changes needed  Disc lifstyle change with low sodium diet and exercise  Lab today 

## 2012-06-20 NOTE — Assessment & Plan Note (Signed)
Re check this today Not symptomatic  Nl exam

## 2012-06-21 NOTE — Telephone Encounter (Signed)
Appt made with Dr Elvera Lennox patient aware.

## 2012-06-24 ENCOUNTER — Telehealth: Payer: Self-pay

## 2012-06-24 NOTE — Telephone Encounter (Signed)
Pt picked up all her med at [facility] on 06/22/12; pt said had auto refill for Prilosec OTC which was picked up also; pt said she has never taken Omeprazole; I explained Omeprazole is generic for Prilosec.spoke with Vinay pharmacist at [facility] and verified same med; but pt pays $0 for Prilosec OTC and $ 11.94 for Omeprazole. Vinay said if pt will bring back Omeprazole he will take back med. In future pt request Prilosec OTC. Advised pt when request refill from pharmacy to let them know you do not want generic.pt voiced understanding.

## 2012-06-27 ENCOUNTER — Ambulatory Visit
Admission: RE | Admit: 2012-06-27 | Discharge: 2012-06-27 | Disposition: A | Discharge status: Home / Self Care | Payer: Medicare Other | Source: Ambulatory Visit | Attending: Family Medicine | Admitting: Family Medicine

## 2012-06-27 DIAGNOSIS — Z1231 Encounter for screening mammogram for malignant neoplasm of breast: Secondary | ICD-10-CM

## 2012-07-01 ENCOUNTER — Encounter: Payer: Self-pay | Admitting: *Deleted

## 2012-07-29 ENCOUNTER — Encounter: Payer: Self-pay | Admitting: Internal Medicine

## 2012-07-29 ENCOUNTER — Ambulatory Visit (INDEPENDENT_AMBULATORY_CARE_PROVIDER_SITE_OTHER): Payer: Medicare Other | Admitting: Internal Medicine

## 2012-07-29 VITALS — BP 126/74 | HR 90 | Temp 98.6°F | Ht 62.0 in | Wt 149.0 lb

## 2012-07-29 DIAGNOSIS — R946 Abnormal results of thyroid function studies: Secondary | ICD-10-CM

## 2012-07-29 DIAGNOSIS — R7989 Other specified abnormal findings of blood chemistry: Secondary | ICD-10-CM

## 2012-07-29 NOTE — Patient Instructions (Addendum)
You have a condition named: Subclinical Hyperthyroidism Please return in 6 months. Thyroid Diseases Your thyroid is a butterfly-shaped gland in your neck. It is located just above your collarbone. It is one of your endocrine glands, which make hormones. The thyroid helps set your metabolism. Metabolism is how your body gets energy from the foods you eat.  Millions of people have thyroid diseases. Women experience thyroid problems more often than men. In fact, overactive thyroid problems (hyperthyroidism) occur in 1% of all women. If you have a thyroid disease, your body may use energy more slowly or quickly than it should.  Thyroid problems also include an immune disease where your body reacts against your thyroid gland (called thyroiditis). A different problem involves lumps and bumps (called nodules) that develop in the gland. The nodules are usually, but not always, noncancerous.  THE MOST COMMON THYROID PROBLEMS AND CAUSES ARE DISCUSSED BELOW There are many causes for thyroid problems. Treatment depends upon the exact diagnosis and includes trying to reset your body's metabolism to a normal rate. Hyperthyroidism Too much thyroid hormone from an overactive thyroid gland is called hyperthyroidism. In hyperthyroidism, the body's metabolism speeds up. One of the most frequent forms of hyperthyroidism is known as Graves' disease. Graves' disease tends to run in families. Although Graves' is thought to be caused by a problem with the immune system, the exact nature of the genetic problem is unknown. Hypothyroidism Too little thyroid hormone from an underactive thyroid gland is called hypothyroidism. In hypothyroidism, the body's metabolism is slowed. Several things can cause this condition. Most causes affect the thyroid gland directly and hurt its ability to make enough hormone.  Rarely, there may be a pituitary gland tumor (located near the base of the brain). The tumor can block the pituitary from  producing thyroid-stimulating hormone (TSH). Your body makes TSH to stimulate the thyroid to work properly. If the pituitary does not make enough TSH, the thyroid fails to make enough hormones needed for good health. Whether the problem is caused by thyroid conditions or by the pituitary gland, the result is that the thyroid is not making enough hormones. Hypothyroidism causes many physical and mental processes to become sluggish. The body consumes less oxygen and produces less body heat. Thyroid Nodules A thyroid nodule is a small swelling or lump in the thyroid gland. They are common. These nodules represent either a growth of thyroid tissue or a fluid-filled cyst. Both form a lump in the thyroid gland. Almost half of all people will have tiny thyroid nodules at some point in their lives. Typically, these are not noticeable until they become large and affect normal thyroid size. Larger nodules that are greater than a half inch across (about 1 centimeter) occur in about 5 percent of people. Although most nodules are not cancerous, people who have them should seek medical care to rule out cancer. Also, some thyroid nodules may produce too much thyroid hormone or become too large. Large nodules or a large gland can interfere with breathing or swallowing or may cause neck discomfort. Other problems Other thyroid problems include cancer and thyroiditis. Thyroiditis is a malfunction of the body's immune system. Normally, the immune system works to defend the body against infection and other problems. When the immune system is not working properly, it may mistakenly attack normal cells, tissues, and organs. Examples of autoimmune diseases are Hashimoto's thyroiditis (which causes low thyroid function) and Graves' disease (which causes excess thyroid function). SYMPTOMS  Symptoms vary greatly depending upon the  exact type of problem with the thyroid. Hyperthyroidism-is when your thyroid is too active and makes  more thyroid hormone than your body needs. The most common cause is Graves' Disease. Too much thyroid hormone can cause some or all of the following symptoms:  Anxiety.  Irritability.  Difficulty sleeping.  Fatigue.  A rapid or irregular heartbeat.  A fine tremor of your hands or fingers.  An increase in perspiration.  Sensitivity to heat.  Weight loss, despite normal food intake.  Brittle hair.  Enlargement of your thyroid gland (goiter).  Light menstrual periods.  Frequent bowel movements. Graves' disease can specifically cause eye and skin problems. The skin problems involve reddening and swelling of the skin, often on your shins and on the top of your feet. Eye problems can include the following:  Excess tearing and sensation of grit or sand in either or both eyes.  Reddened or inflamed eyes.  Widening of the space between your eyelids.  Swelling of the lids and tissues around the eyes.  Light sensitivity.  Ulcers on the cornea.  Double vision.  Limited eye movements.  Blurred or reduced vision. Hypothyroidism- is when your thyroid gland is not active enough. This is more common than hyperthyroidism. Symptoms can vary a lot depending of the severity of the hormone deficiency. Symptoms may develop over a long period of time and can include several of the following:  Fatigue.  Sluggishness.  Increased sensitivity to cold.  Constipation.  Pale, dry skin.  A puffy face.  Hoarse voice.  High blood cholesterol level.  Unexplained weight gain.  Muscle aches, tenderness and stiffness.  Pain, stiffness or swelling in your joints.  Muscle weakness.  Heavier than normal menstrual periods.  Brittle fingernails and hair.  Depression. Thyroid Nodules - most do not cause signs or symptoms. Occasionally, some may become so large that you can feel or even see the swelling at the base of your neck. You may realize a lump or swelling is there when you are  shaving or putting on makeup. Men might become aware of a nodule when shirt collars suddenly feel too tight. Some nodules produce too much thyroid hormone. This can produce the same symptoms as hyperthyroidism (see above). Thyroid nodules are seldom cancerous. However, a nodule is more likely to be malignant (cancerous) if it:  Grows quickly or feels hard.  Causes you to become hoarse or to have trouble swallowing or breathing.  Causes enlarged lymph nodes under your jaw or in your neck. DIAGNOSIS  Because there are so many possible thyroid conditions, your caregiver may ask for a number of tests. They will do this in order to narrow down the exact diagnosis. These tests can include:  Blood and antibody tests.  Special thyroid scans using small, safe amounts of radioactive iodine.  Ultrasound of the thyroid gland (particularly if there is a nodule or lump).  Biopsy. This is usually done with a special needle. A needle biopsy is a procedure to obtain a sample of cells from the thyroid. The tissue will be tested in a lab and examined under a microscope. TREATMENT  Treatment depends on the exact diagnosis. Hyperthyroidism  Beta-blockers help relieve many of the symptoms.  Anti-thyroid medications prevent the thyroid from making excess hormones.  Radioactive iodine treatment can destroy overactive thyroid cells. The iodine can permanently decrease the amount of hormone produced.  Surgery to remove the thyroid gland.  Treatments for eye problems that come from Graves' disease also include medications and special  eye surgery, if felt to be appropriate. Hypothyroidism Thyroid replacement with levothyroxine is the mainstay of treatment. Treatment with thyroid replacement is usually lifelong and will require monitoring and adjustment from time to time. Thyroid Nodules  Watchful waiting. If a small nodule causes no symptoms or signs of cancer on biopsy, then no treatment may be chosen at  first. Re-exam and re-checking blood tests would be the recommended follow-up.  Anti-thyroid medications or radioactive iodine treatment may be recommended if the nodules produce too much thyroid hormone (see Treatment for Hyperthyroidism above).  Alcohol ablation. Injections of small amounts of ethyl alcohol (ethanol) can cause a non-cancerous nodule to shrink in size.  Surgery (see Treatment for Hyperthyroidism above). HOME CARE INSTRUCTIONS   Take medications as instructed.  Follow through on recommended testing. SEEK MEDICAL CARE IF:   You feel that you are developing symptoms of Hyperthyroidism or Hypothyroidism as described above.  You develop a new lump/nodule in the neck/thyroid area that you had not noticed before.  You feel that you are having side effects from medicines prescribed.  You develop trouble breathing or swallowing. SEEK IMMEDIATE MEDICAL CARE IF:   You develop a fever of 102 F (38.9 C) or higher.  You develop severe sweating.  You develop palpitations and/or rapid heart beat.  You develop shortness of breath.  You develop nausea and vomiting.  You develop extreme shakiness.  You develop agitation.  You develop lightheadedness or have a fainting episode. Document Released: 04/23/2007 Document Revised: 09/18/2011 Document Reviewed: 04/23/2007 [facility] Patient Information 2013 [employer], [place].

## 2012-07-29 NOTE — Progress Notes (Signed)
Subjective:     Patient ID: Deborah Gardner, female   DOB: 02-Nov-1945, 67 y.o.   MRN: [id number]  HPI Deborah Gardner is a pleasant 67 y/o woman referred by PCP, Dr. Milinda Antis, for evaluation for low TSH.  TSH levels: Component     Latest Ref Rng 03/08/2011 06/13/2012 06/18/2012  TSH     0.35 - 5.50 uIU/mL 0.54 0.32 (L) 0.21 (L)  Free T4     0.60 - 1.60 ng/dL   4.09  I could trace back to 2008 TSH levels b/w 0.4-0.7. She was not told that she has thyroid problems/goiter/nodules in the past.  She did not have any contrast study recently, not taking any herbal supplements. She is on Prilosec OTC one every morning since she has GERD and a hiatal hernia.   She denies dysphagia, odynophagia or hoarseness. No nodules palpated in neck. No weight variations; 143-149 lbs. No tremors, palpitations, CP, SOB, cough.   Half-sister has Graves ds., mother had a thyroid nodule and was taking medicines for it.   She has a large hiatal hernia and has GERD especially when lying down on the left side. She had cataract Sx in both eyes and 3 days ago she had laser tx on R eye. She has increased IO pressure in L eye. No eye bulging. She has a lot of stress at home as she takes care of her brother who has dementia and urinary incontinence and she is trying to have him placed in a SNF.   Past Medical History  Diagnosis Date  . Cataract   . Hyperlipidemia   . Arthritis   . Osteoporosis   . Rectal pain   . HTN (hypertension)   . OA (osteoarthritis)     hands  . GERD (gastroesophageal reflux disease)   . Allergic rhinitis   . Esophagitis   . HH (hiatus hernia)    Past Surgical History  Procedure Date  . Abdominal hysterectomy   . Hemorrhoid surgery   . Retinal laser surgery   . Breast cyst aspiration 1/03  . Cataract extraction 9/07  . Esophagogastroduodenoscopy 11/08  . Appendectomy   . Rectal sphincterotomy    History   Social History  . Marital Status: Married    Spouse Name: N/A    Number of  Children: 2  . Years of Education: N/A   Occupational History  . Retired    Social History Main Topics  . Smoking status: Never Smoker   . Smokeless tobacco: Never Used  . Alcohol Use: No  . Drug Use: No  . Sexually Active: Not on file   Other Topics Concern  . Not on file   Social History Narrative   Deborah Gardner Nine childrenTypist--retired 2010Lives with husband,  and cares for her brother--who had alcohol and strokeCared for elderly mother for years (she passed in 2009)   Current Outpatient Prescriptions on File Prior to Visit  Medication Sig Dispense Refill  . acetaminophen (TYLENOL) 500 MG tablet Take 1,000 mg by mouth every 6 (six) hours as needed.      Marland Kitchen amLODipine-benazepril (LOTREL) 5-10 MG per capsule Take 1 capsule by mouth daily.  30 capsule  11  . atorvastatin (LIPITOR) 20 MG tablet Take 1 tablet (20 mg total) by mouth daily.  30 tablet  11  . Calcium Carbonate (CALCIUM 600 PO) Take 1 capsule by mouth daily.      . Cholecalciferol (VITAMIN D) 1000 UNITS capsule Take 1,000 Units by mouth daily.       Marland Kitchen  fish oil-omega-3 fatty acids 1000 MG capsule Take 2 g by mouth daily.       . fluticasone (FLONASE) 50 MCG/ACT nasal spray Place 2 sprays into the nose daily.  16 g  11  . meloxicam (MOBIC) 7.5 MG tablet Take 1 tablet (7.5 mg total) by mouth daily. With food  30 tablet  11  . Multiple Vitamin (MULTIVITAMIN PO) Take by mouth daily.        . NON FORMULARY Arthaffect (one scoop daily with apple juice)       . omeprazole (PRILOSEC) 20 MG capsule Take 1 capsule (20 mg total) by mouth daily.  30 capsule  11  . traMADol (ULTRAM) 50 MG tablet Take 50 mg by mouth as needed.       No Known Allergies  Family History  Problem Relation Age of Onset  . Colon cancer Father   . Pneumonia Father   . Thyroid disease Mother   . Depression Mother   . Hypertension Mother   . Osteoporosis Mother   . Alcohol abuse Brother     terminal   . Alcohol abuse Brother   . Liver disease Brother      from alcohol  . Thyroid disease      half sister  . Breast cancer Maternal Aunt   . Colon cancer      Aunt   Review of Systems Constitutional: no weight gain/loss, no fatigue, no subjective hyperthermia/hypothermia, occasional poor sleep, occas. Hot flushes Eyes: R eye blurry vision -had laser sx 3 days ago - has photopsia, no xerophthalmia ENT: no sore throat, no nodules palpated in throat, no dysphagia/odynophagia, no hoarseness Cardiovascular: no CP/SOB/palpitations/leg swelling Respiratory: no cough/SOB Gastrointestinal: no N/V/D/C - has GERD Musculoskeletal: no muscle/but has arthritic joint aches Skin: no rashes Neurological: no tremors/numbness/tingling/dizziness Psychiatric: no depression/anxiety   Objective:   Physical Exam BP 126/74  Pulse 90  Temp 98.6 F (37 C) (Oral)  Ht 5\' 2"  (1.575 m)  Wt 149 lb (67.586 kg)  BMI 27.25 kg/m2  SpO2 97% Wt Readings from Last 3 Encounters:  07/29/12 149 lb (67.586 kg)  06/18/12 145 lb 12 oz (66.112 kg)  03/28/12 149 lb (67.586 kg)   Constitutional: overweight, in NAD Eyes: PERRLA, EOMI, no exophthalmos ENT: moist mucous membranes, plump thyroid, no thyromegaly, no cervical lymphadenopathy Cardiovascular: RRR, No MRG Respiratory: CTA B Gastrointestinal: abdomen soft, NT, ND, BS+ Musculoskeletal: Heberden and Bouchard nodules, strength intact in all 4 Skin: moist, warm, no rashes Neurological: no tremor with outstretched hands, DTR normal in all 4  Assessment:     1. Subclinical hyperthyroidism    Plan:     Deborah Gardner has had slightly low TSH levels throughout the years starting at least in 2008. Recently, she had 2 values that were low. A free T4 level was normal though. This qualifies her for subclinical hyperthyroidism. She is euthyroid on exam with no signs of Graves' disease (sister with Graves' disease). - I explained to her physiology of pituitary thyroid hormonal axis and the significance of having a low TSH with a  normal T4. - We discussed several possible etiologies of her low TSH including Graves' disease and Uni/multinodular goiter - Due to the fact that her free T4 is normal, we'll continue to follow her expectantly, with another set of labs performed in 6 months from now. This needs to include a TSH, a free T4, and a free T3. I did explain to her that she is at risk for developing  clinical hyperthyroidism which will need to be more aggressive about. I explained what the risks of clinical hyperthyroidism are, including arrhythmia, CHF, osteoporosis. - I reassured the patient regarding her labs, and also advised her not to change her diet if this is not the cause for her low TSH;  she was also wondering whether stress can do this, but this is not the case - I advised her to call me if she develops hyperthyroid symptoms (I explained what they are) - I gave her information about thyroid disease is in general (see patient instructions) - Return to clinic in 6 months - No labs for now

## 2012-09-12 ENCOUNTER — Encounter: Payer: Self-pay | Admitting: *Deleted

## 2012-09-20 ENCOUNTER — Ambulatory Visit: Payer: Medicare Other | Admitting: Family Medicine

## 2012-09-20 ENCOUNTER — Encounter: Payer: Self-pay | Admitting: Family Medicine

## 2012-09-20 VITALS — BP 122/80 | HR 64 | Temp 98.4°F | Wt 147.0 lb

## 2012-09-20 DIAGNOSIS — J069 Acute upper respiratory infection, unspecified: Secondary | ICD-10-CM

## 2012-09-20 NOTE — Progress Notes (Signed)
  Subjective:    Patient ID: Deborah Gardner, female    DOB: 1945-08-12, 67 y.o.   MRN: [id number]  HPI She has a three-day history of nasal congestion runny nose and headache. She is a slight sore throat and dry cough.She has had some chills. She does have underlying allergies.   Review of Systems     Objective:   Physical Exam alert and in no distress. Tympanic membranes and canals are normal. Throat is clear. Tonsils are normal. Neck is supple without adenopathy or thyromegaly. Cardiac exam shows a regular sinus rhythm without murmurs or gallops. Lungs are clear to auscultation.        Assessment & Plan:  URI, acute Use the Coricidin D. Usually these things run their course in about a week but if you get worse at the end of the week let us know. Tylenol for aches and pains

## 2012-09-20 NOTE — Patient Instructions (Addendum)
Use the Coricidin D. Usually these things run their course in about a week but if you get worse at the end of the week let us know. Tylenol for aches and pains

## 2012-09-24 ENCOUNTER — Encounter: Payer: Self-pay | Admitting: Gastroenterology

## 2012-10-09 ENCOUNTER — Telehealth: Payer: Self-pay | Admitting: Family Medicine

## 2012-10-09 MED ORDER — ALPRAZOLAM 0.5 MG PO TABS: 0.5000 mg | ORAL_TABLET | Freq: Every evening | ORAL | Status: DC | PRN

## 2012-10-09 NOTE — Telephone Encounter (Signed)
Rx called in as prescribed. Pt notified me and advise that she doesn't need anything long term she just needs something short term

## 2012-10-09 NOTE — Telephone Encounter (Signed)
Patient Information:  Caller Name: [employer]  Phone: [phone]  Patient: Deborah, Gardner  Gender: Female  DOB: 14-Jan-1946  Age: 67 Years  PCP: [facility])  Office Follow Up:  Does the office need to follow up with this patient?: Yes  Instructions For The Office: Pt requesting Rx for her nerves.   Symptoms  Reason For Call & Symptoms: Patient states she is under a lot of stress caring for her brother and would like to have  Dr Milinda Antis to call in something for her nerves.  No sleeping well with all the decisions she needs to make about the care for her brother.  Reviewed Health History In EMR: Yes  Reviewed Medications In EMR: Yes  Reviewed Allergies In EMR: Yes  Reviewed Surgeries / Procedures: Yes  Date of Onset of Symptoms: 10/02/2012  Guideline(s) Used:  No Protocol Available - Information Only  Disposition Per Guideline:   Discuss with PCP and Callback by Nurse Today  Reason For Disposition Reached:   Nursing judgment  Advice Given:  N/A  RN Overrode Recommendation:  Patient Requests Prescription  Pt requesting RX

## 2012-10-09 NOTE — Telephone Encounter (Signed)
We can try xanax short term for severe symptoms (this is habit forming-not to use regularly) , and f/u with me in the near future to discuss more regular long term treatment if she needs it  Px written for call in

## 2012-10-17 ENCOUNTER — Telehealth: Payer: Self-pay

## 2012-10-17 NOTE — Telephone Encounter (Signed)
Pt left v/m requesting; pt has been getting Prilosec OTC by prescription # 84 for $ 5.00; now insurance coverage thru [facility] and will not approve name brand Prilosec and pt can get generic prilosec,Omeprazole 20 mg #31 day supply for $ 10. Pt request omeprazole 20 mg # 30 taking one daily called to [facility].Spoke with Christy at [facility] omeprazole sent in 06/2012 was voided out; not sure why.Neysa Bonito will reactivate prescription and will contact pt if cost is more than $ 10. Pt notified and will call back if needed.

## 2012-11-04 ENCOUNTER — Ambulatory Visit (INDEPENDENT_AMBULATORY_CARE_PROVIDER_SITE_OTHER)
Admission: RE | Admit: 2012-11-04 | Discharge: 2012-11-04 | Disposition: A | Discharge status: Home / Self Care | Payer: Medicare Other | Source: Ambulatory Visit | Attending: Family Medicine | Admitting: Family Medicine

## 2012-11-04 DIAGNOSIS — M899 Disorder of bone, unspecified: Secondary | ICD-10-CM

## 2012-11-13 ENCOUNTER — Telehealth: Payer: Self-pay

## 2012-11-13 NOTE — Telephone Encounter (Signed)
Pt request date of last labs and ck up date; given to pt. The case mgr nurse from [facility] comes out once a year and she is there now.

## 2012-11-14 ENCOUNTER — Encounter: Payer: Self-pay | Admitting: Family Medicine

## 2012-11-14 ENCOUNTER — Encounter: Payer: Self-pay | Admitting: *Deleted

## 2013-02-06 ENCOUNTER — Encounter: Payer: Self-pay | Admitting: Gastroenterology

## 2013-02-10 ENCOUNTER — Ambulatory Visit (INDEPENDENT_AMBULATORY_CARE_PROVIDER_SITE_OTHER): Payer: Medicare Other | Admitting: Internal Medicine

## 2013-02-10 ENCOUNTER — Encounter: Payer: Self-pay | Admitting: Internal Medicine

## 2013-02-10 VITALS — BP 110/62 | HR 68 | Temp 98.3°F | Resp 12 | Ht 62.0 in | Wt 150.0 lb

## 2013-02-10 DIAGNOSIS — E0590 Thyrotoxicosis, unspecified without thyrotoxic crisis or storm: Secondary | ICD-10-CM

## 2013-02-10 LAB — T3, FREE: T3, Free: 2.9 pg/mL (ref 2.3–4.2)

## 2013-02-10 LAB — TSH: TSH: 0.29 u[IU]/mL — ABNORMAL LOW (ref 0.35–5.50)

## 2013-02-10 LAB — T4, FREE: Free T4: 0.91 ng/dL (ref 0.60–1.60)

## 2013-02-10 NOTE — Patient Instructions (Signed)
Please let me know if you develop weight loss, increased pulse, tremors, palpitations, more anxiety, problems with swallowing, or lumps in neck. We will call you with the lab results.

## 2013-02-10 NOTE — Progress Notes (Signed)
Subjective:     Patient ID: Deborah Gardner, female   DOB: 22-Jul-1945, 67 y.o.   MRN: [id number]  HPI Deborah Gardner is a pleasant 67 y/o woman returning for f/u for subclinical hyperthyroidism. Last visit 6 mo ago.  At last visit, patient was diagnosed with subclinical hyperthyroidism, which we decided to follow expectantly, especially since she was asymptomatic.  I reviewed her thyroid tests back to 2008 TSH levels b/w 0.4-0.7. She was not told that she has thyroid problems/goiter/nodules in the past. Most recent TSH levels: Component     Latest Ref Rng 03/08/2011 06/13/2012 06/18/2012  TSH     0.35 - 5.50 uIU/mL 0.54 0.32 (L) 0.21 (L)  Free T4     0.60 - 1.60 ng/dL   1.47   She denies dysphagia, odynophagia or hoarseness. No nodules palpated in neck. No large weight variations - within 3 lbs. No tremors, palpitations, CP, SOB, hoarseness, dysphagia.   Half-sister has Graves ds., mother had a thyroid nodule and was taking medicines for it.   Review of Systems  Constitutional: no weight gain/loss, no fatigue, no subjective hyperthermia/hypothermia Eyes: no blurry vision, no xerophthalmia ENT: no sore throat, no nodules palpated in throat, no dysphagia/odynophagia, no hoarseness Cardiovascular: no CP/SOB/palpitations/leg swelling Respiratory: no cough/SOB Gastrointestinal: no N/V/D/C Musculoskeletal: no muscle/joint aches Skin: no rashes Neurological: no tremors/numbness/tingling/dizziness Psychiatric: no depression/+ anxiety, but not chaged   Objective:   Physical Exam BP 110/62  Pulse 68  Temp(Src) 98.3 F (36.8 C) (Oral)  Resp 12  Ht 5\' 2"  (1.575 m)  Wt 150 lb (68.04 kg)  BMI 27.43 kg/m2  SpO2 97% Wt Readings from Last 3 Encounters:  02/10/13 150 lb (68.04 kg)  09/20/12 147 lb (66.679 kg)  07/29/12 149 lb (67.586 kg)   Constitutional: overweight, in NAD Eyes: PERRLA, EOMI, no exophthalmos ENT: moist mucous membranes, plump thyroid, no thyromegaly, no cervical  lymphadenopathy Cardiovascular: RRR, No MRG Respiratory: CTA B Gastrointestinal: abdomen soft, NT, ND, BS+ Musculoskeletal: Heberden and Bouchard nodules, strength intact in all 4 Skin: moist, warm, no rashes Neurological: no tremor with outstretched hands, DTR normal in all 4  Assessment:     1. Subclinical hyperthyroidism    Plan:     Deborah Gardner has had slightly low TSH levels throughout the years starting at least in 2008. Recently, she had 2 values that were low. A free T4 level was normal though. This qualifies her for subclinical hyperthyroidism. She is euthyroid on exam with no signs of Graves' disease (sister with Graves' disease). - We discussed several possible etiologies of her low TSH including Graves' disease, Uni/multinodular goiter, and thyroiditis. - Due to the fact that her free T4 was normal, at last visit we decided to continue to follow her expectantly, with another set of labs performed at the time of the current visit.  - will check a TSH, a free T4, and a free T3 today. If labs returned showing subclinical hyperthyroidism, would need to repeat the tests in few months, if they show overt hyperthyroidism, would check an uptake and scan, and if they are normal, we will continue to check her TSH levels annually or biannually. I explained the purpose of the uptake and scan, and patient mentions that she would prefer to have this at [facility], if possible, in case we need to go this route. - I did explain to her that she is at risk for developing clinical hyperthyroidism which will need to be more aggressive about. I advised her  to let me know if she develops signs and symptoms of hyperthyroidism, however at the moment, she appears completely euthyroid - I advised her to call me if she develops hyperthyroid symptoms (I explained what they are) - Return to clinic only if thyroid tests return abnormal, otherwise, follow with Dr. Milinda Gardner and return to me on an as-needed basis  Office  Visit on 02/10/2013  Component Date Value Range Status  . TSH 02/10/2013 0.29* 0.35 - 5.50 uIU/mL Final  . T3, Free 02/10/2013 2.9  2.3 - 4.2 pg/mL Final  . Free T4 02/10/2013 0.91  0.60 - 1.60 ng/dL Final   TSH better (increased from 0.21) >> left voice message for patient to return for repeat labs (not a visit) in 6 months. We will schedule an appointment, if needed, after I receive the results back.

## 2013-04-01 ENCOUNTER — Ambulatory Visit (AMBULATORY_SURGERY_CENTER): Payer: Medicare Other

## 2013-04-01 VITALS — Ht 62.0 in | Wt 152.0 lb

## 2013-04-01 DIAGNOSIS — Z800 Family history of malignant neoplasm of digestive organs: Secondary | ICD-10-CM

## 2013-04-01 DIAGNOSIS — Z86010 Personal history of colonic polyps: Secondary | ICD-10-CM

## 2013-04-01 MED ORDER — MOVIPREP 100 G PO SOLR: ORAL | Status: DC

## 2013-04-02 ENCOUNTER — Encounter: Payer: Self-pay | Admitting: Gastroenterology

## 2013-04-10 ENCOUNTER — Ambulatory Visit (AMBULATORY_SURGERY_CENTER): Payer: Medicare Other | Admitting: Gastroenterology

## 2013-04-10 ENCOUNTER — Encounter: Payer: Self-pay | Admitting: Gastroenterology

## 2013-04-10 VITALS — BP 121/72 | HR 54 | Temp 96.8°F | Resp 10 | Ht 62.0 in | Wt 152.0 lb

## 2013-04-10 DIAGNOSIS — Z86010 Personal history of colonic polyps: Secondary | ICD-10-CM

## 2013-04-10 DIAGNOSIS — Z800 Family history of malignant neoplasm of digestive organs: Secondary | ICD-10-CM

## 2013-04-10 DIAGNOSIS — K5730 Diverticulosis of large intestine without perforation or abscess without bleeding: Secondary | ICD-10-CM

## 2013-04-10 MED ORDER — SODIUM CHLORIDE 0.9 % IV SOLN: 500.0000 mL | INTRAVENOUS | Status: DC

## 2013-04-10 NOTE — Patient Instructions (Addendum)

## 2013-04-10 NOTE — Progress Notes (Signed)
Patient did not experience any of the following events: a burn prior to discharge; a fall within the facility; wrong site/side/patient/procedure/implant event; or a hospital transfer or hospital admission upon discharge from the facility. (G8907) Patient did not have preoperative order for IV antibiotic SSI prophylaxis. (G8918)  

## 2013-04-10 NOTE — Op Note (Signed)
[facility] [address] [place], [address]   COLONOSCOPY PROCEDURE REPORT  PATIENT: Deborah Gardner, Deborah Gardner  MR#: [id number] BIRTHDATE: August 07, 1945 , 67  yrs. old GENDER: Female ENDOSCOPIST: Mardella Layman, MD, [facility] REFERRED BY: PROCEDURE DATE:  04/10/2013 PROCEDURE:   Colonoscopy, screening First Screening Colonoscopy - Avg.  risk and is 50 yrs.  old or older - No.      History of Adenoma - Now for follow-up colonoscopy & has been > or = to 3 yrs.  Yes hx of adenoma.  Has been 3 or more years since last colonoscopy. ASA CLASS:   Class II INDICATIONS:Patient's personal history of adenomatous colon polyps and Patient's immediate family history of colon cancer. MEDICATIONS: propofol (Diprivan) 150mg  IV  DESCRIPTION OF PROCEDURE:   After the risks benefits and alternatives of the procedure were thoroughly explained, informed consent was obtained.  A digital rectal exam revealed no abnormalities of the rectum.   The LB [id number]  endoscope was introduced through the anus and advanced to the cecum, which was identified by both the appendix and ileocecal valve. No adverse events experienced.   The quality of the prep was excellent, using MoviPrep  The instrument was then slowly withdrawn as the colon was fully examined.      COLON FINDINGS: Mild diverticulosis was noted in the sigmoid colon. The colon was otherwise normal.  There was no diverticulosis, inflammation, polyps or cancers unless previously stated .Small hyperplastic rectal nodulles noted.  Retroflexed views revealed no abnormalities. The time to cecum=5 minutes 15 seconds.  Withdrawal time=6 minutes 0 seconds.  The scope was withdrawn and the procedure completed. COMPLICATIONS: There were no complications.  ENDOSCOPIC IMPRESSION: 1.   Mild diverticulosis was noted in the sigmoid colon 2.   The colon was otherwise normal,small hyperplastic rectal nodules not biopsied.  RECOMMENDATIONS: 1.   Given your significant family history of colon cancer, you should have a repeat colonoscopy in 5 years 2.  Continue current medications 3.  High fiber diet   eSigned:  Mardella Layman, MD, [facility] 04/10/2013 9:46 AM   cc: Judy Pimple, MD

## 2013-04-11 ENCOUNTER — Telehealth: Payer: Self-pay

## 2013-04-11 NOTE — Telephone Encounter (Signed)
  Follow up Call-  Call back number 04/10/2013  Post procedure Call Back phone  # [phone]  Permission to leave phone message Yes     Patient questions:  Do you have a fever, pain , or abdominal swelling? no Pain Score  0 *  Have you tolerated food without any problems? yes  Have you been able to return to your normal activities? yes  Do you have any questions about your discharge instructions: Diet   no Medications  no Follow up visit  no  Do you have questions or concerns about your Care? no  Actions: * If pain score is 4 or above: No action needed, pain <4.

## 2013-05-01 ENCOUNTER — Other Ambulatory Visit (INDEPENDENT_AMBULATORY_CARE_PROVIDER_SITE_OTHER): Payer: Medicare Other

## 2013-05-01 DIAGNOSIS — Z23 Encounter for immunization: Secondary | ICD-10-CM

## 2013-06-20 ENCOUNTER — Telehealth: Payer: Self-pay

## 2013-06-20 ENCOUNTER — Encounter: Payer: Self-pay | Admitting: Internal Medicine

## 2013-06-20 ENCOUNTER — Ambulatory Visit (INDEPENDENT_AMBULATORY_CARE_PROVIDER_SITE_OTHER): Payer: Medicare Other | Admitting: Internal Medicine

## 2013-06-20 VITALS — BP 140/80 | HR 87 | Temp 98.2°F | Wt 150.0 lb

## 2013-06-20 DIAGNOSIS — R350 Frequency of micturition: Secondary | ICD-10-CM

## 2013-06-20 DIAGNOSIS — N390 Urinary tract infection, site not specified: Secondary | ICD-10-CM

## 2013-06-20 LAB — POCT URINALYSIS DIPSTICK
Blood, UA: NEGATIVE
Glucose, UA: NEGATIVE
Protein, UA: NEGATIVE
Spec Grav, UA: <=1.005
Urobilinogen, UA: NEGATIVE

## 2013-06-20 MED ORDER — CIPROFLOXACIN HCL 500 MG PO TABS: 500.0000 mg | ORAL_TABLET | Freq: Two times a day (BID) | ORAL | Status: DC

## 2013-06-20 NOTE — Progress Notes (Signed)
HPI  Pt presents to the clinic today with c/o urgency and bladder spasms. This started about 5 days ago. She has been taking AZO OTC. She denies fever, chills or back pain.   Review of Systems  Past Medical History  Diagnosis Date  . Cataract     bil  . Hyperlipidemia   . Arthritis   . Osteoporosis   . Rectal pain   . HTN (hypertension)   . OA (osteoarthritis)     hands  . GERD (gastroesophageal reflux disease)   . Allergic rhinitis   . Esophagitis   . HH (hiatus hernia)   . Colon polyps   . Increased pressure in the eye     Family History  Problem Relation Age of Onset  . Colon cancer Father   . Pneumonia Father   . Thyroid disease Mother   . Depression Mother   . Hypertension Mother   . Osteoporosis Mother   . Alcohol abuse Brother     terminal   . Alcohol abuse Brother   . Liver disease Brother     from alcohol  . Thyroid disease      half sister  . Breast cancer Maternal Aunt   . Colon cancer      Aunt  . Breast cancer Paternal Aunt   . Breast cancer Paternal Aunt     History   Social History  . Marital Status: Married    Spouse Name: N/A    Number of Children: 2  . Years of Education: N/A   Occupational History  . Retired    Social History Main Topics  . Smoking status: Never Smoker   . Smokeless tobacco: Never Used  . Alcohol Use: No  . Drug Use: No  . Sexual Activity: Not on file   Other Topics Concern  . Not on file   Social History Narrative   Married      2 children      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 with husband,  and cares for her brother--who had alcohol and stroke      Cared for elderly mother for years (she passed in 2009)    No Known Allergies  Constitutional: Denies fever, malaise, fatigue, headache or abrupt weight changes.   GU: Pt reports urgency, frequency and pain with urination. Denies burning sensation, blood in urine, odor or discharge. Skin: Denies redness, rashes, lesions or ulcercations.   No other  specific complaints in a complete review of systems (except as listed in HPI above).    Objective:   Physical Exam  BP 140/80  Pulse 87  Temp(Src) 98.2 F (36.8 C) (Tympanic)  Wt 150 lb (68.04 kg)  SpO2 98% Wt Readings from Last 3 Encounters:  06/20/13 150 lb (68.04 kg)  04/10/13 152 lb (68.947 kg)  04/01/13 152 lb (68.947 kg)    General: Appears her stated age, well developed, well nourished in NAD. Cardiovascular: Normal rate and rhythm. S1,S2 noted.  No murmur, rubs or gallops noted. No JVD or BLE edema. No carotid bruits noted. Pulmonary/Chest: Normal effort and positive vesicular breath sounds. No respiratory distress. No wheezes, rales or ronchi noted.  Abdomen: Soft and nontender. Normal bowel sounds, no bruits noted. No distention or masses noted. Liver, spleen and kidneys non palpable. Tender to palpation over the bladder area. No CVA tenderness.      Assessment & Plan:   Urgency and bladder spasms secondary to UTI versus interstitial cystitis:  Urinalysis- + nitirites (  affected by AZO) eRx sent if for Cipro 500 mg BID x 5 days Drink plenty of fluids  RTC as needed or if symptoms persist.

## 2013-06-20 NOTE — Patient Instructions (Signed)

## 2013-06-20 NOTE — Telephone Encounter (Signed)
For 2 days pt has had burning upon urination with spasms; pt has taken OTC med with minimal relief. Pt scheduled appt today at 4:15 pm with Nicki Reaper NP.

## 2013-06-26 ENCOUNTER — Other Ambulatory Visit: Payer: Self-pay | Admitting: Family Medicine

## 2013-06-26 NOTE — Telephone Encounter (Signed)
Electronic refill request, no recent/future appts., please advise  

## 2013-06-26 NOTE — Telephone Encounter (Signed)
Please set her up for a PE in the late spring and refill until then, thanks

## 2013-07-21 ENCOUNTER — Encounter: Payer: Self-pay | Admitting: Family Medicine

## 2013-07-21 ENCOUNTER — Ambulatory Visit (INDEPENDENT_AMBULATORY_CARE_PROVIDER_SITE_OTHER): Payer: Medicare Other | Admitting: Family Medicine

## 2013-07-21 ENCOUNTER — Ambulatory Visit: Payer: Medicare Other | Admitting: Family Medicine

## 2013-07-21 VITALS — BP 136/86 | HR 71 | Temp 99.1°F | Ht 62.0 in | Wt 151.2 lb

## 2013-07-21 DIAGNOSIS — R300 Dysuria: Secondary | ICD-10-CM

## 2013-07-21 DIAGNOSIS — N390 Urinary tract infection, site not specified: Secondary | ICD-10-CM

## 2013-07-21 LAB — POCT URINALYSIS DIPSTICK
BILIRUBIN UA: NEGATIVE
GLUCOSE UA: NEGATIVE
NITRITE UA: NEGATIVE
Spec Grav, UA: >=1.03
Urobilinogen, UA: 0.2
pH, UA: 6

## 2013-07-21 LAB — POCT UA - MICROSCOPIC ONLY
Casts, Ur, LPF, POC: 0
Yeast, UA: 0

## 2013-07-21 MED ORDER — CIPROFLOXACIN HCL 250 MG PO TABS: 250.0000 mg | ORAL_TABLET | Freq: Two times a day (BID) | ORAL | Status: DC

## 2013-07-21 NOTE — Assessment & Plan Note (Signed)
Cover with 3 d of low dose cipro  cx urine Enc fluids If no imp - consider urol eval  Disc poss of urethritis - counseled to avoid detergents/ baths and also acidic beverages

## 2013-07-21 NOTE — Patient Instructions (Signed)
Drink lots of water Take cipro 250 mg twice daily for 3 days  Take care of yourself  We will call when urine culture returns

## 2013-07-21 NOTE — Progress Notes (Signed)
Subjective:    Patient ID: Deborah Gardner, female    DOB: June 03, 1946, 68 y.o.   MRN: [id number]  HPI Here for urinary symptoms  Now her symptoms are intermittent  Frequent and urgent urination- has to wear a pad Also pain in low abdomen/ bladder area  Some low back pain   She may not drink enough water    No vaginal discharge    In mid dec-tx with cipro and felt much better initially (high dose cipro)  Patient Active Problem List   Diagnosis Date Noted  . Subclinical hyperthyroidism 06/18/2012  . Pruritus ani 03/08/2011  . UNSPECIFIED ESOPHAGITIS 08/12/2010  . OSTEOARTHRITIS, GENERALIZED, HAND 03/21/2010  . 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 ROTATOR CUFF SYNDROME, LEFT 03/21/2010  . ALLERGIC RHINITIS 11/03/2008  . ARTHRALGIA 10/08/2008  . ADENOMATOUS COLONIC POLYP 08/19/2007  . ESOPHAGITIS, REFLUX 08/19/2007  . HIATAL HERNIA 08/19/2007  . HYPERLIPIDEMIA 03/05/2007  . G E R D 03/05/2007  . OSTEOPENIA 03/05/2007  . HYPERTENSION 02/08/2007  . IBS 02/08/2007  . FIBROCYSTIC BREAST DISEASE 02/08/2007  . OSTEOARTHRITIS 02/08/2007  . [place] DISEASE, LUMBAR 02/08/2007  . STRESS INCONTINENCE 02/08/2007   Past Medical History  Diagnosis Date  . Cataract     bil  . Hyperlipidemia   . Arthritis   . Osteoporosis   . Rectal pain   . HTN (hypertension)   . OA (osteoarthritis)     hands  . GERD (gastroesophageal reflux disease)   . Allergic rhinitis   . Esophagitis   . HH (hiatus hernia)   . Colon polyps   . Increased pressure in the eye    Past Surgical History  Procedure Laterality Date  . Abdominal hysterectomy      still has ovaries  . Hemorrhoid surgery    . Retinal laser surgery    . Breast cyst aspiration  1/03  . Cataract extraction  9/07  . Esophagogastroduodenoscopy  11/08  . Appendectomy    . Rectal sphincterotomy     History  Substance Use Topics  . Smoking status: Never Smoker   . Smokeless tobacco: Never Used  . Alcohol Use: No   Family  History  Problem Relation Age of Onset  . Colon cancer Father   . Pneumonia Father   . Thyroid disease Mother   . Depression Mother   . Hypertension Mother   . Osteoporosis Mother   . Alcohol abuse Brother     terminal   . Alcohol abuse Brother   . Liver disease Brother     from alcohol  . Thyroid disease      half sister  . Breast cancer Maternal Aunt   . Colon cancer      Aunt  . Breast cancer Paternal Aunt   . Breast cancer Paternal Aunt    No Known Allergies Current Outpatient Prescriptions on File Prior to Visit  Medication Sig Dispense Refill  . acetaminophen (TYLENOL) 500 MG tablet Take 1,000 mg by mouth every 6 (six) hours as needed.      . ALPRAZolam (XANAX) 0.5 MG tablet Take 1 tablet (0.5 mg total) by mouth at bedtime as needed for sleep or anxiety.  20 tablet  0  . amLODipine-benazepril (LOTREL) 5-10 MG per capsule TAKE ONE CAPSULE ONCE DAILY  30 capsule  5  . atorvastatin (LIPITOR) 20 MG tablet TAKE 1 TABLET ONCE DAILY  30 tablet  5  . Calcium Carbonate (CALCIUM 600 PO) Take 1 capsule by mouth daily.      Marland Kitchen  Cholecalciferol (VITAMIN D) 1000 UNITS capsule Take 1,000 Units by mouth daily.       . fish oil-omega-3 fatty acids 1000 MG capsule Take 1 g by mouth daily.       . fluticasone (FLONASE) 50 MCG/ACT nasal spray Place 2 sprays into the nose as needed.      Marland Kitchen ibuprofen (ADVIL,MOTRIN) 200 MG tablet Take 200 mg by mouth every 6 (six) hours as needed for pain.      . Multiple Vitamin (MULTIVITAMIN PO) Take by mouth daily.        . NON FORMULARY Arthaffect (one scoop daily with apple juice)       . omeprazole (PRILOSEC) 20 MG capsule Take 20 mg by mouth daily.      . timolol (TIMOPTIC) 0.5 % ophthalmic solution Place 1 drop into both eyes 2 (two) times daily.       . traMADol (ULTRAM) 50 MG tablet Take 50 mg by mouth as needed.       No current facility-administered medications on file prior to visit.    Review of Systems Review of Systems  Constitutional:  Negative for fever, appetite change, fatigue and unexpected weight change.  Eyes: Negative for pain and visual disturbance.  Respiratory: Negative for cough and shortness of breath.   Cardiovascular: Negative for cp or palpitations    Gastrointestinal: Negative for nausea, diarrhea and constipation.  Genitourinary: pos for urgency and frequency. neg for hematuria or flank pain Skin: Negative for pallor or rash   Neurological: Negative for weakness, light-headedness, numbness and headaches.  Hematological: Negative for adenopathy. Does not bruise/bleed easily.  Psychiatric/Behavioral: Negative for dysphoric mood. The patient is not nervous/anxious.         Objective:   Physical Exam        Assessment & Plan:

## 2013-07-21 NOTE — Progress Notes (Signed)
Pre-visit discussion using our clinic review tool. No additional management support is needed unless otherwise documented below in the visit note.  

## 2013-07-23 LAB — URINE CULTURE
Colony Count: NO GROWTH
ORGANISM ID, BACTERIA: NO GROWTH

## 2013-07-25 ENCOUNTER — Telehealth: Payer: Self-pay | Admitting: Family Medicine

## 2013-07-25 DIAGNOSIS — R300 Dysuria: Secondary | ICD-10-CM

## 2013-07-25 NOTE — Telephone Encounter (Signed)
Urine symptoms persist-ref to [facility]

## 2013-09-29 ENCOUNTER — Other Ambulatory Visit: Payer: Self-pay | Admitting: *Deleted

## 2013-09-29 MED ORDER — OMEPRAZOLE 20 MG PO CPDR: 20.0000 mg | DELAYED_RELEASE_CAPSULE | Freq: Every day | ORAL | Status: DC

## 2013-09-29 MED ORDER — AMLODIPINE BESY-BENAZEPRIL HCL 5-10 MG PO CAPS
ORAL_CAPSULE | ORAL | Status: DC
Start: 2013-09-29 — End: 2014-01-07

## 2013-09-29 MED ORDER — ATORVASTATIN CALCIUM 20 MG PO TABS: ORAL_TABLET | ORAL | Status: DC

## 2013-09-29 NOTE — Addendum Note (Signed)
Addended by: Tammi Sou on: 09/29/2013 04:13 PM   Modules accepted: Orders

## 2013-10-07 ENCOUNTER — Emergency Department (HOSPITAL_COMMUNITY): Payer: Medicare Other

## 2013-10-07 ENCOUNTER — Emergency Department (HOSPITAL_COMMUNITY)
Admission: EM | Admit: 2013-10-07 | Discharge: 2013-10-07 | Disposition: A | Discharge status: Home / Self Care | Payer: Medicare Other | Attending: Emergency Medicine | Admitting: Emergency Medicine

## 2013-10-07 ENCOUNTER — Encounter (HOSPITAL_COMMUNITY): Payer: Self-pay | Admitting: Emergency Medicine

## 2013-10-07 DIAGNOSIS — I10 Essential (primary) hypertension: Secondary | ICD-10-CM | POA: Insufficient documentation

## 2013-10-07 DIAGNOSIS — S0003XA Contusion of scalp, initial encounter: Secondary | ICD-10-CM | POA: Insufficient documentation

## 2013-10-07 DIAGNOSIS — W010XXA Fall on same level from slipping, tripping and stumbling without subsequent striking against object, initial encounter: Secondary | ICD-10-CM | POA: Insufficient documentation

## 2013-10-07 DIAGNOSIS — M1990 Unspecified osteoarthritis, unspecified site: Secondary | ICD-10-CM | POA: Insufficient documentation

## 2013-10-07 DIAGNOSIS — R51 Headache: Secondary | ICD-10-CM | POA: Insufficient documentation

## 2013-10-07 DIAGNOSIS — H269 Unspecified cataract: Secondary | ICD-10-CM | POA: Insufficient documentation

## 2013-10-07 DIAGNOSIS — S0083XA Contusion of other part of head, initial encounter: Principal | ICD-10-CM

## 2013-10-07 DIAGNOSIS — IMO0002 Reserved for concepts with insufficient information to code with codable children: Secondary | ICD-10-CM | POA: Insufficient documentation

## 2013-10-07 DIAGNOSIS — S1093XA Contusion of unspecified part of neck, initial encounter: Principal | ICD-10-CM

## 2013-10-07 DIAGNOSIS — Y92009 Unspecified place in unspecified non-institutional (private) residence as the place of occurrence of the external cause: Secondary | ICD-10-CM | POA: Insufficient documentation

## 2013-10-07 DIAGNOSIS — M810 Age-related osteoporosis without current pathological fracture: Secondary | ICD-10-CM | POA: Insufficient documentation

## 2013-10-07 DIAGNOSIS — Y9383 Activity, rough housing and horseplay: Secondary | ICD-10-CM | POA: Insufficient documentation

## 2013-10-07 DIAGNOSIS — Z79899 Other long term (current) drug therapy: Secondary | ICD-10-CM | POA: Insufficient documentation

## 2013-10-07 DIAGNOSIS — E785 Hyperlipidemia, unspecified: Secondary | ICD-10-CM | POA: Insufficient documentation

## 2013-10-07 NOTE — ED Notes (Signed)
Pt called x1 to go back to a room and no answer.

## 2013-10-07 NOTE — ED Provider Notes (Signed)
CSN: [id number]     Arrival date & time 10/07/13  1947 History   First MD Initiated Contact with Patient 10/07/13 2131     Chief Complaint  Patient presents with  . Fall  . Head Injury     (Consider location/radiation/quality/duration/timing/severity/associated sxs/prior Treatment) Patient is a 68 y.o. female presenting with fall and head injury. The history is provided by the patient. No language interpreter was used.  Fall This is a new problem. The current episode started today. The problem has been gradually improving. Associated symptoms include headaches. Pertinent negatives include no nausea, numbness, visual change or vomiting.  Head Injury Associated symptoms: headache   Associated symptoms: no nausea, no numbness and no vomiting     Past Medical History  Diagnosis Date  . Cataract     bil  . Hyperlipidemia   . Arthritis   . Osteoporosis   . Rectal pain   . HTN (hypertension)   . OA (osteoarthritis)     hands  . GERD (gastroesophageal reflux disease)   . Allergic rhinitis   . Esophagitis   . HH (hiatus hernia)   . Colon polyps   . Increased pressure in the eye    Past Surgical History  Procedure Laterality Date  . Abdominal hysterectomy      still has ovaries  . Hemorrhoid surgery    . Retinal laser surgery    . Breast cyst aspiration  1/03  . Cataract extraction  9/07  . Esophagogastroduodenoscopy  11/08  . Appendectomy    . Rectal sphincterotomy     Family History  Problem Relation Age of Onset  . Colon cancer Father   . Pneumonia Father   . Thyroid disease Mother   . Depression Mother   . Hypertension Mother   . Osteoporosis Mother   . Alcohol abuse Brother     terminal   . Alcohol abuse Brother   . Liver disease Brother     from alcohol  . Thyroid disease      half sister  . Breast cancer Maternal Aunt   . Colon cancer      Aunt  . Breast cancer Paternal Aunt   . Breast cancer Paternal Aunt    History  Substance Use Topics  .  Smoking status: Never Smoker   . Smokeless tobacco: Never Used  . Alcohol Use: No   OB History   Grav Para Term Preterm Abortions TAB SAB Ect Mult Living                 Review of Systems  Gastrointestinal: Negative for nausea and vomiting.  Neurological: Positive for headaches. Negative for numbness.  All other systems reviewed and are negative.      Allergies  Review of patient's allergies indicates no known allergies.  Home Medications   Current Outpatient Rx  Name  Route  Sig  Dispense  Refill  . acetaminophen (TYLENOL) 500 MG tablet   Oral   Take 1,000 mg by mouth every 6 (six) hours as needed for mild pain.          Marland Kitchen amLODipine-benazepril (LOTREL) 5-10 MG per capsule      TAKE ONE CAPSULE ONCE DAILY   90 capsule   0   . atorvastatin (LIPITOR) 20 MG tablet      TAKE 1 TABLET ONCE DAILY   90 tablet   0   . Calcium Carbonate (CALCIUM 600 PO)   Oral   Take 1 capsule by mouth  daily.         . Cholecalciferol (VITAMIN D) 1000 UNITS capsule   Oral   Take 1,000 Units by mouth daily.          . fish oil-omega-3 fatty acids 1000 MG capsule   Oral   Take 1 g by mouth daily.          . fluticasone (FLONASE) 50 MCG/ACT nasal spray   Nasal   Place 2 sprays into the nose as needed.         Marland Kitchen ibuprofen (ADVIL,MOTRIN) 200 MG tablet   Oral   Take 200 mg by mouth every 6 (six) hours as needed for pain.         . Multiple Vitamin (MULTIVITAMIN PO)   Oral   Take by mouth daily.           . NON FORMULARY      Arthaffect (one scoop daily with apple juice)          . omeprazole (PRILOSEC) 20 MG capsule   Oral   Take 1 capsule (20 mg total) by mouth daily.   90 capsule   0   . timolol (TIMOPTIC) 0.5 % ophthalmic solution   Both Eyes   Place 1 drop into both eyes 2 (two) times daily.          . ciprofloxacin (CIPRO) 250 MG tablet   Oral   Take 250 mg by mouth 2 (two) times daily. Completed course of medication a month ago from today  (10-07-13)          BP 147/67  Pulse 73  Temp(Src) 98.7 F (37.1 C) (Oral)  Resp 18  SpO2 100% Physical Exam  Nursing note and vitals reviewed. Constitutional: She is oriented to person, place, and time. She appears well-developed and well-nourished.  HENT:  Head: Normocephalic.  Eyes: Conjunctivae and EOM are normal. Pupils are equal, round, and reactive to light.  Neck: Normal range of motion. Neck supple.  Cardiovascular: Normal rate and regular rhythm.   Pulmonary/Chest: Effort normal and breath sounds normal.  Abdominal: Soft. Bowel sounds are normal.  Musculoskeletal: Normal range of motion. She exhibits no edema and no tenderness.  Lymphadenopathy:    She has no cervical adenopathy.  Neurological: She is alert and oriented to person, place, and time. She has normal strength. No cranial nerve deficit or sensory deficit. Coordination normal. GCS eye subscore is 4. GCS verbal subscore is 5. GCS motor subscore is 6.  Skin: Skin is warm and dry.  Psychiatric: She has a normal mood and affect. Her behavior is normal. Judgment and thought content normal.    ED Course  Procedures (including critical care time) Labs Review Labs Reviewed - No data to display Imaging Review Ct Head Wo Contrast  10/07/2013   CLINICAL DATA:  Head injury, fell and hit head on kitchen floor, denies loss of consciousness  EXAM: CT HEAD WITHOUT CONTRAST  TECHNIQUE: Contiguous axial images were obtained from the base of the skull through the vertex without intravenous contrast.  COMPARISON:  None.  FINDINGS: Scalp hematoma near the vertex posteriorly on the right. Mild age-related atrophy. Minimal low attenuation in the deep white matter. No evidence of infarct, hemorrhage, or extra-axial fluid. No hydrocephalus. No skull fracture.  IMPRESSION: No acute intracranial abnormality.   Electronically Signed   By: Skipper Cliche M.D.   On: 10/07/2013 20:46     EKG Interpretation None     CT results reviewed  and  shared with patient. No acute intracranial findings.  Exam reassuring.  Normal neurologic exam.  No loss of consciousness.  Patient discussed with and seen by Dr. Regenia Skeeter.  Discharged home with head injury return precautions. MDM   Final diagnoses:  None    Scalp hematoma.    Norman Herrlich, NP 10/08/13 [phone]

## 2013-10-07 NOTE — Discharge Instructions (Signed)
Head Injury, Adult  You have received a head injury. It does not appear serious at this time. Headaches and vomiting are common following head injury. It should be easy to awaken from sleeping. Sometimes it is necessary for you to stay in the emergency department for a while for observation. Sometimes admission to the hospital may be needed. After injuries such as yours, most problems occur within the first 24 hours, but side effects may occur up to 7 10 days after the injury. It is important for you to carefully monitor your condition and contact your health care provider or seek immediate medical care if there is a change in your condition.  WHAT ARE THE TYPES OF HEAD INJURIES?  Head injuries can be as minor as a bump. Some head injuries can be more severe. More severe head injuries include:  · A jarring injury to the brain (concussion).  · A bruise of the brain (contusion). This mean there is bleeding in the brain that can cause swelling.  · A cracked skull (skull fracture).  · Bleeding in the brain that collects, clots, and forms a bump (hematoma).  WHAT CAUSES A HEAD INJURY?  A serious head injury is most likely to happen to someone who is in a car wreck and is not wearing a seat belt. Other causes of major head injuries include bicycle or motorcycle accidents, sports injuries, and falls.  HOW ARE HEAD INJURIES DIAGNOSED?  A complete history of the event leading to the injury and your current symptoms will be helpful in diagnosing head injuries. Many times, pictures of the brain, such as CT or MRI are needed to see the extent of the injury. Often, an overnight hospital stay is necessary for observation.   WHEN SHOULD I SEEK IMMEDIATE MEDICAL CARE?   You should get help right away if:  · You have confusion or drowsiness.  · You feel sick to your stomach (nauseous) or have continued, forceful vomiting.  · You have dizziness or unsteadiness that is getting worse.  · You have severe, continued headaches not  relieved by medicine. Only take over-the-counter or prescription medicines for pain, fever, or discomfort as directed by your health care provider.  · You do not have normal function of the arms or legs or are unable to walk.  · You notice changes in the black spots in the center of the colored part of your eye (pupil).  · You have a clear or bloody fluid coming from your nose or ears.  · You have a loss of vision.  During the next 24 hours after the injury, you must stay with someone who can watch you for the warning signs. This person should contact local emergency services (911 in the [place] if you have seizures, you become unconscious, or you are unable to wake up.  HOW CAN I PREVENT A HEAD INJURY IN THE FUTURE?  The most important factor for preventing major head injuries is avoiding motor vehicle accidents.  To minimize the potential for damage to your head, it is crucial to wear seat belts while riding in motor vehicles. Wearing helmets while bike riding and playing collision sports (like football) is also helpful. Also, avoiding dangerous activities around the house will further help reduce your risk of head injury.   WHEN CAN I RETURN TO NORMAL ACTIVITIES AND ATHLETICS?  You should be reevaluated by your health care provider before returning to these activities. If you have any of the following symptoms, you should not return   to activities or contact sports until 1 week after the symptoms have stopped:  · Persistent headache.  · Dizziness or vertigo.  · Poor attention and concentration.  · Confusion.  · Memory problems.  · Nausea or vomiting.  · Fatigue or tire easily.  · Irritability.  · Intolerant of bright lights or loud noises.  · Anxiety or depression.  · Disturbed sleep.  MAKE SURE YOU:   · Understand these instructions.  · Will watch your condition.  · Will get help right away if you are not doing well or get worse.  Document Released: 06/26/2005 Document Revised: 04/16/2013 Document Reviewed:  03/03/2013  [employer]® Patient Information ©2014 [employer].

## 2013-10-07 NOTE — ED Notes (Signed)
Pt presents to department for evaluation of head injury. States she was playing with grandson when she accidentally fell and struck head kitchen floor. Hematoma noted upon arrival, no lacerations/abrasions noted. 8/10 pain at the time. Pt is alert and oriented x4. Denies LOC. No other injuries noted.

## 2013-10-08 NOTE — ED Provider Notes (Signed)
Medical screening examination/treatment/procedure(s) were conducted as a shared visit with non-physician practitioner(s) and myself.  I personally evaluated the patient during the encounter.   EKG Interpretation None      Patient with mechanical fall with small posterior hematoma. No anticoagulants. Normal neuro exam. Negative head CT. Will discharge with good return precautions  Ephraim Hamburger, MD 10/08/13 1415

## 2013-10-22 ENCOUNTER — Other Ambulatory Visit: Payer: Self-pay | Admitting: Family Medicine

## 2013-11-21 ENCOUNTER — Encounter: Payer: Self-pay | Admitting: Family Medicine

## 2013-11-21 ENCOUNTER — Ambulatory Visit (INDEPENDENT_AMBULATORY_CARE_PROVIDER_SITE_OTHER): Payer: Medicare Other | Admitting: Family Medicine

## 2013-11-21 VITALS — BP 130/78 | HR 60 | Temp 98.4°F | Ht 60.5 in | Wt 148.2 lb

## 2013-11-21 DIAGNOSIS — E785 Hyperlipidemia, unspecified: Secondary | ICD-10-CM

## 2013-11-21 DIAGNOSIS — F419 Anxiety disorder, unspecified: Secondary | ICD-10-CM

## 2013-11-21 DIAGNOSIS — M899 Disorder of bone, unspecified: Secondary | ICD-10-CM

## 2013-11-21 DIAGNOSIS — F411 Generalized anxiety disorder: Secondary | ICD-10-CM

## 2013-11-21 DIAGNOSIS — M949 Disorder of cartilage, unspecified: Secondary | ICD-10-CM

## 2013-11-21 DIAGNOSIS — G4700 Insomnia, unspecified: Secondary | ICD-10-CM | POA: Insufficient documentation

## 2013-11-21 DIAGNOSIS — Z0000 Encounter for general adult medical examination without abnormal findings: Secondary | ICD-10-CM

## 2013-11-21 DIAGNOSIS — I10 Essential (primary) hypertension: Secondary | ICD-10-CM

## 2013-11-21 LAB — CBC WITH DIFFERENTIAL/PLATELET
BASOS ABS: 0 10*3/uL (ref 0.0–0.1)
Basophils Relative: 0.5 % (ref 0.0–3.0)
EOS ABS: 0.2 10*3/uL (ref 0.0–0.7)
Eosinophils Relative: 2.5 % (ref 0.0–5.0)
HCT: 36.7 % (ref 36.0–46.0)
Hemoglobin: 12.5 g/dL (ref 12.0–15.0)
Lymphocytes Relative: 29.7 % (ref 12.0–46.0)
Lymphs Abs: 2.4 10*3/uL (ref 0.7–4.0)
MCHC: 34 g/dL (ref 30.0–36.0)
MCV: 88.8 fl (ref 78.0–100.0)
Monocytes Absolute: 0.6 10*3/uL (ref 0.1–1.0)
Monocytes Relative: 7.8 % (ref 3.0–12.0)
NEUTROS PCT: 59.5 % (ref 43.0–77.0)
Neutro Abs: 4.7 10*3/uL (ref 1.4–7.7)
Platelets: 247 10*3/uL (ref 150.0–400.0)
RBC: 4.14 Mil/uL (ref 3.87–5.11)
RDW: 13.2 % (ref 11.5–15.5)
WBC: 7.9 10*3/uL (ref 4.0–10.5)

## 2013-11-21 LAB — LIPID PANEL
CHOL/HDL RATIO: 2
Cholesterol: 143 mg/dL (ref 0–200)
HDL: 64.2 mg/dL (ref >39.00)
LDL CALC: 58 mg/dL (ref 0–99)
TRIGLYCERIDES: 106 mg/dL (ref 0.0–149.0)
VLDL: 21.2 mg/dL (ref 0.0–40.0)

## 2013-11-21 LAB — COMPREHENSIVE METABOLIC PANEL
ALT: 13 U/L (ref 0–35)
AST: 17 U/L (ref 0–37)
Albumin: 4.3 g/dL (ref 3.5–5.2)
Alkaline Phosphatase: 64 U/L (ref 39–117)
BUN: 16 mg/dL (ref 6–23)
CALCIUM: 9.6 mg/dL (ref 8.4–10.5)
CHLORIDE: 104 meq/L (ref 96–112)
CO2: 27 mEq/L (ref 19–32)
CREATININE: 0.6 mg/dL (ref 0.4–1.2)
GFR: 99.19 mL/min (ref >60.00)
Glucose, Bld: 85 mg/dL (ref 70–99)
POTASSIUM: 3.9 meq/L (ref 3.5–5.1)
Sodium: 139 mEq/L (ref 135–145)
Total Bilirubin: 0.8 mg/dL (ref 0.2–1.2)
Total Protein: 7.5 g/dL (ref 6.0–8.3)

## 2013-11-21 LAB — TSH: TSH: 0.09 u[IU]/mL — ABNORMAL LOW (ref 0.35–4.50)

## 2013-11-21 MED ORDER — ALPRAZOLAM 0.5 MG PO TABS: 0.5000 mg | ORAL_TABLET | Freq: Every day | ORAL | Status: DC

## 2013-11-21 NOTE — Patient Instructions (Signed)
Labs today  Don't forget to schedule your mammogram  Use xanax sparingly when you need it and use caution so you do not fall     Fall Prevention and Home Safety Falls cause injuries and can affect all age groups. It is possible to use preventive measures to significantly decrease the likelihood of falls. There are many simple measures which can make your home safer and prevent falls. OUTDOORS  Repair cracks and edges of walkways and driveways.  Remove high doorway thresholds.  Trim shrubbery on the main path into your home.  Have good outside lighting.  Clear walkways of tools, rocks, debris, and clutter.  Check that handrails are not broken and are securely fastened. Both sides of steps should have handrails.  Have leaves, snow, and ice cleared regularly.  Use sand or salt on walkways during winter months.  In the garage, clean up grease or oil spills. BATHROOM  Install night lights.  Install grab bars by the toilet and in the tub and shower.  Use non-skid mats or decals in the tub or shower.  Place a plastic non-slip stool in the shower to sit on, if needed.  Keep floors dry and clean up all water on the floor immediately.  Remove soap buildup in the tub or shower on a regular basis.  Secure bath mats with non-slip, double-sided rug tape.  Remove throw rugs and tripping hazards from the floors. BEDROOMS  Install night lights.  Make sure a bedside light is easy to reach.  Do not use oversized bedding.  Keep a telephone by your bedside.  Have a firm chair with side arms to use for getting dressed.  Remove throw rugs and tripping hazards from the floor. KITCHEN  Keep handles on pots and pans turned toward the center of the stove. Use back burners when possible.  Clean up spills quickly and allow time for drying.  Avoid walking on wet floors.  Avoid hot utensils and knives.  Position shelves so they are not too high or low.  Place commonly used  objects within easy reach.  If necessary, use a sturdy step stool with a grab bar when reaching.  Keep electrical cables out of the way.  Do not use floor polish or wax that makes floors slippery. If you must use wax, use non-skid floor wax.  Remove throw rugs and tripping hazards from the floor. STAIRWAYS  Never leave objects on stairs.  Place handrails on both sides of stairways and use them. Fix any loose handrails. Make sure handrails on both sides of the stairways are as long as the stairs.  Check carpeting to make sure it is firmly attached along stairs. Make repairs to worn or loose carpet promptly.  Avoid placing throw rugs at the top or bottom of stairways, or properly secure the rug with carpet tape to prevent slippage. Get rid of throw rugs, if possible.  Have an electrician put in a light switch at the top and bottom of the stairs. OTHER FALL PREVENTION TIPS  Wear low-heel or rubber-soled shoes that are supportive and fit well. Wear closed toe shoes.  When using a stepladder, make sure it is fully opened and both spreaders are firmly locked. Do not climb a closed stepladder.  Add color or contrast paint or tape to grab bars and handrails in your home. Place contrasting color strips on first and last steps.  Learn and use mobility aids as needed. Install an electrical emergency response system.  Turn on lights  Turn on lights to avoid dark areas. Replace light bulbs that burn out immediately. Get light switches that glow.  Arrange furniture to create clear pathways. Keep furniture in the same place.  Firmly attach carpet with non-skid or double-sided tape.  Eliminate uneven floor surfaces.  Select a carpet pattern that does not visually hide the edge of steps.  Be aware of all pets. OTHER HOME SAFETY TIPS  Set the water temperature for 120 F (48.8 C).  Keep emergency numbers on or near the telephone.  Keep smoke detectors on every level of the home and near sleeping  areas. Document Released: 06/16/2002 Document Revised: 12/26/2011 Document Reviewed: 09/15/2011 [employer] Patient Information 2014 [employer].  

## 2013-11-21 NOTE — Progress Notes (Signed)
Subjective:    Patient ID: Deborah Gardner, female    DOB: May 08, 1946, 68 y.o.   MRN: [id number]  HPI I have personally reviewed the Medicare Annual Wellness questionnaire and have noted 1. The patient's medical and social history 2. Their use of alcohol, tobacco or illicit drugs 3. Their current medications and supplements 4. The patient's functional ability including ADL's, fall risks, home safety risks and hearing or visual             impairment. 5. Diet and physical activities 6. Evidence for depression or mood disorders  The patients weight, height, BMI have been recorded in the chart and visual acuity is per eye clinic.  I have made referrals, counseling and provided education to the patient based review of the above and I have provided the pt with a written personalized care plan for preventive services.   Doing well overall  Not sleeping well  She took xanax occ in the past - not often  Brain is going all night long  Lots and lots of stress - family issues - had to move her brother to memory care , and she has to care some for her sister - finances etc (trying get her some help)   Wt is down 3 lb  bmi of 28  Very active lifestyle - but not additional exercise  Diet is good   See scanned forms.  Routine anticipatory guidance given to patient.  See health maintenance. Colon cancer screening 10/14 colonosc  Breast cancer screening mammogram 12/13 - she will schedule that at the breast center  Self breast exam -no lumps  No gyn problems  Flu vaccine 10/14 Tetanus vaccine 2/06  Pneumovax 9/12 --she declines the pneumovax  Zoster vaccine 5/11  Advance directive-has a living will  Cognitive function addressed- see scanned forms- and if abnormal then additional documentation follows. - she has no major concerns -anxiety affects it at times/ a lot of responsibilty   PMH and SH reviewed  Meds, vitals, and allergies reviewed.   ROS: See HPI.  Otherwise negative.     Patient Active Problem List   Diagnosis Date Noted  . Encounter for Medicare annual wellness exam 11/21/2013  . Anxiety 11/21/2013  . Dysuria 07/25/2013  . UTI (urinary tract infection) 07/21/2013  . Subclinical hyperthyroidism 06/18/2012  . Pruritus ani 03/08/2011  . UNSPECIFIED ESOPHAGITIS 08/12/2010  . OSTEOARTHRITIS, GENERALIZED, HAND 03/21/2010  . 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 ROTATOR CUFF SYNDROME, LEFT 03/21/2010  . ALLERGIC RHINITIS 11/03/2008  . ARTHRALGIA 10/08/2008  . ADENOMATOUS COLONIC POLYP 08/19/2007  . ESOPHAGITIS, REFLUX 08/19/2007  . HIATAL HERNIA 08/19/2007  . HYPERLIPIDEMIA 03/05/2007  . G E R D 03/05/2007  . OSTEOPENIA 03/05/2007  . HYPERTENSION 02/08/2007  . IBS 02/08/2007  . FIBROCYSTIC BREAST DISEASE 02/08/2007  . OSTEOARTHRITIS 02/08/2007  . Johnston DISEASE, LUMBAR 02/08/2007  . STRESS INCONTINENCE 02/08/2007   Past Medical History  Diagnosis Date  . Cataract     bil  . Hyperlipidemia   . Arthritis   . Osteoporosis   . Rectal pain   . HTN (hypertension)   . OA (osteoarthritis)     hands  . GERD (gastroesophageal reflux disease)   . Allergic rhinitis   . Esophagitis   . HH (hiatus hernia)   . Colon polyps   . Increased pressure in the eye    Past Surgical History  Procedure Laterality Date  . Abdominal hysterectomy      still has ovaries  .  Hemorrhoid surgery    . Retinal laser surgery    . Breast cyst aspiration  1/03  . Cataract extraction  9/07  . Esophagogastroduodenoscopy  11/08  . Appendectomy    . Rectal sphincterotomy     History  Substance Use Topics  . Smoking status: Never Smoker   . Smokeless tobacco: Never Used  . Alcohol Use: No   Family History  Problem Relation Age of Onset  . Colon cancer Father   . Pneumonia Father   . Thyroid disease Mother   . Depression Mother   . Hypertension Mother   . Osteoporosis Mother   . Alcohol abuse Brother     terminal   . Alcohol abuse Brother   . Liver disease  Brother     from alcohol  . Thyroid disease      half sister  . Breast cancer Maternal Aunt   . Colon cancer      Aunt  . Breast cancer Paternal Aunt   . Breast cancer Paternal Aunt    No Known Allergies Current Outpatient Prescriptions on File Prior to Visit  Medication Sig Dispense Refill  . acetaminophen (TYLENOL) 500 MG tablet Take 1,000 mg by mouth every 6 (six) hours as needed for mild pain.       Marland Kitchen amLODipine-benazepril (LOTREL) 5-10 MG per capsule TAKE ONE CAPSULE ONCE DAILY  90 capsule  0  . atorvastatin (LIPITOR) 20 MG tablet TAKE 1 TABLET ONCE DAILY  90 tablet  0  . Cholecalciferol (VITAMIN D) 1000 UNITS capsule Take 1,000 Units by mouth daily.       . fish oil-omega-3 fatty acids 1000 MG capsule Take 1 g by mouth daily.       . fluticasone (FLONASE) 50 MCG/ACT nasal spray PLACE 2 SPRAYS INTO THE NOSE DAILY.  16 g  3  . ibuprofen (ADVIL,MOTRIN) 200 MG tablet Take 200 mg by mouth every 6 (six) hours as needed for pain.      . Multiple Vitamin (MULTIVITAMIN PO) Take by mouth daily.        . NON FORMULARY Arthaffect (one scoop daily with apple juice)       . omeprazole (PRILOSEC) 20 MG capsule Take 1 capsule (20 mg total) by mouth daily.  90 capsule  0  . timolol (TIMOPTIC) 0.5 % ophthalmic solution Place 1 drop into both eyes 2 (two) times daily.        No current facility-administered medications on file prior to visit.    Review of Systems Review of Systems  Constitutional: Negative for fever, appetite change,  and unexpected weight change.  Eyes: Negative for pain and visual disturbance.  Respiratory: Negative for cough and shortness of breath.   Cardiovascular: Negative for cp or palpitations    Gastrointestinal: Negative for nausea, diarrhea and constipation.  Genitourinary: Negative for urgency and frequency.  Skin: Negative for pallor or rash   Neurological: Negative for weakness, light-headedness, numbness and headaches.  Hematological: Negative for adenopathy.  Does not bruise/bleed easily.  Psychiatric/Behavioral: Negative for dysphoric mood. The patient is not nervous/anxious.  pos for stressors        Objective:   Physical Exam  Constitutional: She appears well-developed and well-nourished. No distress.  overwt and well appearing   HENT:  Head: Normocephalic and atraumatic.  Right Ear: External ear normal.  Left Ear: External ear normal.  Mouth/Throat: Oropharynx is clear and moist.  Eyes: Conjunctivae and EOM are normal. Pupils are equal, round, and reactive to light.  No scleral icterus.  Neck: Normal range of motion. Neck supple. No JVD present. Carotid bruit is not present. No thyromegaly present.  Cardiovascular: Normal rate, regular rhythm, normal heart sounds and intact distal pulses.  Exam reveals no gallop.   Pulmonary/Chest: Effort normal and breath sounds normal. No respiratory distress. She has no wheezes. She exhibits no tenderness.  Abdominal: Soft. Bowel sounds are normal. She exhibits no distension, no abdominal bruit and no mass. There is no tenderness.  Genitourinary: No breast swelling, tenderness, discharge or bleeding.  Breast exam: No mass, nodules, thickening, tenderness, bulging, retraction, inflamation, nipple discharge or skin changes noted.  No axillary or clavicular LA.      Musculoskeletal: Normal range of motion. She exhibits no edema and no tenderness.  Lymphadenopathy:    She has no cervical adenopathy.  Neurological: She is alert. She has normal reflexes. No cranial nerve deficit. She exhibits normal muscle tone. Coordination normal.  Skin: Skin is warm and dry. No rash noted. No erythema. No pallor.  Psychiatric: She has a normal mood and affect.          Assessment & Plan:

## 2013-11-21 NOTE — Progress Notes (Signed)
Pre visit review using our clinic review tool, if applicable. No additional management support is needed unless otherwise documented below in the visit note. 

## 2013-11-22 LAB — VITAMIN D 25 HYDROXY (VIT D DEFICIENCY, FRACTURES): Vit D, 25-Hydroxy: 70 ng/mL (ref 30–89)

## 2013-11-23 NOTE — Assessment & Plan Note (Addendum)
Disc need for calcium/ vitamin D/ wt bearing exercise and bone density test every 2 y to monitor Disc safety/ fracture risk in detail   D level today Given info on fall prev

## 2013-11-23 NOTE — Assessment & Plan Note (Signed)
Pt needs xanax for sleep occasionally Disc the habit forming potential of this and also fall risk Will continue to follow

## 2013-11-23 NOTE — Assessment & Plan Note (Signed)
bp in fair control at this time  BP Readings from Last 1 Encounters:  11/21/13 130/78   No changes needed Disc lifstyle change with low sodium diet and exercise   Labs today

## 2013-11-23 NOTE — Assessment & Plan Note (Signed)
Lipid panel today  Disc low sat fat diet  Continues lipitor

## 2013-11-24 ENCOUNTER — Telehealth: Payer: Self-pay | Admitting: Family Medicine

## 2013-11-24 NOTE — Telephone Encounter (Signed)
Relevant patient education mailed to patient.  

## 2013-11-25 ENCOUNTER — Encounter: Payer: Self-pay | Admitting: *Deleted

## 2013-11-27 ENCOUNTER — Telehealth: Payer: Self-pay | Admitting: *Deleted

## 2013-11-27 NOTE — Telephone Encounter (Signed)
Called pt and advised her of her appt on Wed, June 3rd at 11:00 am.

## 2013-12-10 ENCOUNTER — Ambulatory Visit: Payer: Medicare Other | Admitting: Internal Medicine

## 2013-12-17 ENCOUNTER — Encounter: Payer: Self-pay | Admitting: Internal Medicine

## 2013-12-17 ENCOUNTER — Ambulatory Visit (INDEPENDENT_AMBULATORY_CARE_PROVIDER_SITE_OTHER): Payer: Medicare Other | Admitting: Internal Medicine

## 2013-12-17 VITALS — BP 104/64 | HR 69 | Temp 97.9°F | Resp 12 | Wt 151.0 lb

## 2013-12-17 DIAGNOSIS — E0590 Thyrotoxicosis, unspecified without thyrotoxic crisis or storm: Secondary | ICD-10-CM

## 2013-12-17 LAB — T4, FREE: FREE T4: 0.85 ng/dL (ref 0.60–1.60)

## 2013-12-17 LAB — TSH: TSH: 0.32 u[IU]/mL — ABNORMAL LOW (ref 0.35–4.50)

## 2013-12-17 LAB — T3, FREE: T3, Free: 3 pg/mL (ref 2.3–4.2)

## 2013-12-17 NOTE — Patient Instructions (Signed)
Please stop at the lab.  Please come back for a follow-up appointment in 4 months.   

## 2013-12-17 NOTE — Progress Notes (Addendum)
Subjective:     Patient ID: Deborah Gardner, female   DOB: [birth year], 68 y.o.   MRN: [id number]  HPI Deborah Gardner is a pleasant 68 y.o.woman returning for f/u for subclinical hyperthyroidism. Last visit 6 mo ago.  At last visit we decided to follow her expectantly, especially since she was asymptomatic.  I reviewed her thyroid tests back to 2008: TSH levels b/w 0.4-0.7.   Most recent TSH levels: Lab Results  Component Value Date   TSH 0.09* 11/21/2013   TSH 0.29* 02/10/2013   TSH 0.21* 06/18/2012   TSH 0.32* 06/13/2012   TSH 0.54 03/08/2011   FREET4 0.91 02/10/2013   FREET4 0.91 06/18/2012   She denies dysphagia, odynophagia or hoarseness. No nodules palpated in neck.   No tremors, palpitations, CP, SOB, hoarseness, dysphagia. She c/o anxiety >> asked PCP for a refill for Xanax to take as needed.  Not using it every day.   Of note, sister has Graves ds.   I reviewed pt's medications, allergies, PMH, social hx, family hx and no changes required, except as mentioned above.  Review of Systems  Constitutional: no weight gain/loss, + fatigue, no subjective hyperthermia/hypothermia, + poor sleep Eyes: no blurry vision, no xerophthalmia ENT: no sore throat, no nodules palpated in throat, no dysphagia/odynophagia, no hoarseness Cardiovascular: no CP/SOB/palpitations/leg swelling Respiratory: no cough/SOB Gastrointestinal: no N/V/D/C Musculoskeletal: no muscle/joint aches Skin: no rashes Neurological: no tremors/numbness/tingling/dizziness Psychiatric: no depression/+ anxiety   Objective:   Physical Exam BP 104/64  Pulse 69  Temp(Src) 97.9 F (36.6 C) (Oral)  Resp 12  Wt 151 lb (68.493 kg)  SpO2 97% Wt Readings from Last 3 Encounters:  12/17/13 151 lb (68.493 kg)  11/21/13 148 lb 4 oz (67.246 kg)  07/21/13 151 lb 4 oz (68.607 kg)   Constitutional: slightly overweight, in NAD Eyes: PERRLA, EOMI, no exophthalmos ENT: moist mucous membranes, plump thyroid, no thyromegaly, no  cervical lymphadenopathy Cardiovascular: RRR, No MRG Respiratory: CTA B Gastrointestinal: abdomen soft, NT, ND, BS+ Musculoskeletal: Heberden and Bouchard nodules, strength intact in all 4 Skin: moist, warm, no rashes Neurological: no tremor with outstretched hands, DTR normal in all 4  Assessment:     1. Subclinical hyperthyroidism    Plan:     Deborah Gardner has had slightly low TSH levels throughout the years starting at least in 2008. In 2013 she started to have a low TSH, even lower at last visit with PCP last mo. She has some anxiety which is worse lately, but OTW no hyperthyroid sxs. - We again discussed about several possible etiologies of her low TSH including Graves' disease, Uni/multinodular goiter, and thyroiditis. We also discussed about possible Txs for the above. - since we do not have a free T4 and free T3 with the last TSH, we will check all these and add TSI antibody titer - if labs abnormal, will need an Uptake and scan - she agrees to this; but would prefer an expecting management - I advised her to call me if she develops hyperthyroid symptoms (I explained what they are)  Component     Latest Ref Rng 12/17/2013  TSH     0.35 - 4.50 uIU/mL 0.32 (L)  Free T4     0.60 - 1.60 ng/dL 0.85  T3, Free     2.3 - 4.2 pg/mL 3.0  TSI     <140 % baseline 25   TSH still low, although better. I suggested to do the Uptake and scan >> pt agrees.  CLINICAL DATA: Subclinical hyperthyroidism  EXAM: THYROID SCAN AND UPTAKE - 24 HOURS  TECHNIQUE: Following the per oral administration of I-131 sodium iodide, the patient returned at 24 hours and uptake measurements were acquired with the uptake probe centered on the neck. Thyroid imaging was performed following the intravenous administration of the Tc-35m Pertechnetate.  RADIOPHARMACEUTICALS: 11.5 MicroCuries I-131 Sodium Iodide and 10.0 mCi TC-30m Pertechnetate  COMPARISON: None  FINDINGS: Mild heterogeneity of uptake within  the thyroid gland. No measurable nodularity.  24 hour I 131 uptake = 16% (normal 10-30%)  IMPRESSION: Mild heterogeneity of thyroid tissue suggests multinodular gland versus thyroiditis. No measurable nodularity.  Normal 24 hr I 131 uptake.   Electronically Signed By: Suzy Bouchard M.D. On: 02/26/2014 14:06  It appears she has some mildly overproducing thyroid nodules. No cold areas. I would suggest to just follow the Subclinical hyperthyroidism for now.

## 2013-12-19 LAB — THYROID STIMULATING IMMUNOGLOBULIN: TSI: 25 % baseline (ref <140)

## 2014-01-05 ENCOUNTER — Other Ambulatory Visit: Payer: Self-pay | Admitting: Family Medicine

## 2014-01-07 ENCOUNTER — Other Ambulatory Visit: Payer: Self-pay | Admitting: Family Medicine

## 2014-02-25 ENCOUNTER — Encounter (HOSPITAL_COMMUNITY)
Admission: RE | Admit: 2014-02-25 | Discharge: 2014-02-25 | Disposition: A | Discharge status: Home / Self Care | Payer: Medicare Other | Source: Ambulatory Visit | Attending: Internal Medicine | Admitting: Internal Medicine

## 2014-02-25 DIAGNOSIS — E0590 Thyrotoxicosis, unspecified without thyrotoxic crisis or storm: Secondary | ICD-10-CM | POA: Diagnosis not present

## 2014-02-25 MED ORDER — SODIUM IODIDE I 131 CAPSULE
11.5000 | Freq: Once | INTRAVENOUS | Status: AC | PRN
  Administered 2014-02-25: 11.5 via ORAL

## 2014-02-26 ENCOUNTER — Encounter (HOSPITAL_COMMUNITY)
Admission: RE | Admit: 2014-02-26 | Discharge: 2014-02-26 | Disposition: A | Discharge status: Home / Self Care | Payer: Medicare Other | Source: Ambulatory Visit | Attending: Internal Medicine | Admitting: Internal Medicine

## 2014-02-26 MED ORDER — SODIUM PERTECHNETATE TC 99M INJECTION
10.0000 | Freq: Once | INTRAVENOUS | Status: AC | PRN
  Administered 2014-02-26: 10 via INTRAVENOUS

## 2014-04-03 ENCOUNTER — Encounter: Payer: Self-pay | Admitting: Gastroenterology

## 2014-04-15 ENCOUNTER — Encounter: Payer: Self-pay | Admitting: Internal Medicine

## 2014-04-15 ENCOUNTER — Ambulatory Visit (INDEPENDENT_AMBULATORY_CARE_PROVIDER_SITE_OTHER): Payer: Medicare Other | Admitting: Internal Medicine

## 2014-04-15 VITALS — BP 136/72 | HR 62 | Temp 98.2°F | Resp 12 | Wt 152.0 lb

## 2014-04-15 DIAGNOSIS — E0590 Thyrotoxicosis, unspecified without thyrotoxic crisis or storm: Secondary | ICD-10-CM

## 2014-04-15 LAB — T4, FREE: Free T4: 0.91 ng/dL (ref 0.60–1.60)

## 2014-04-15 LAB — TSH: TSH: 0.14 u[IU]/mL — ABNORMAL LOW (ref 0.35–4.50)

## 2014-04-15 LAB — T3, FREE: T3 FREE: 2.8 pg/mL (ref 2.3–4.2)

## 2014-04-15 NOTE — Progress Notes (Signed)
Subjective:     Patient ID: Deborah Gardner, female   DOB: 05-25-1946, 68 y.o.   MRN: [id number]  HPI [name] is a pleasant 68 y.o.woman returning for f/u for subclinical hyperthyroidism. Last visit 3 mo ago.  At last visits we decided to follow her expectantly, especially since she was asymptomatic.  Most recent TSH levels: Lab Results  Component Value Date   TSH 0.32* 12/17/2013   TSH 0.09* 11/21/2013   TSH 0.29* 02/10/2013   TSH 0.21* 06/18/2012   TSH 0.32* 06/13/2012   FREET4 0.85 12/17/2013   FREET4 0.91 02/10/2013   FREET4 0.91 06/18/2012  I reviewed her thyroid tests back to 2008: TSH levels b/w 0.4-0.7.   Graves Ab'x negative: Component     Latest Ref Rng 12/17/2013  TSI     <140 % baseline 25   Thyroid Uptake and scan (02/26/2014) - normal uptake and mild nodularity on scan. No cold areas.:  Mild heterogeneity of uptake within the thyroid gland. No measurable nodularity. 24 hour I 131 uptake = 16% (normal 10-30%)  She denies dysphagia, odynophagia or hoarseness. No nodules palpated in neck.   No tremors, palpitations, CP, SOB, hoarseness, dysphagia.   Of note, sister has Graves ds.   I reviewed pt's medications, allergies, PMH, social hx, family hx and no changes required, except as mentioned above.  Review of Systems  Constitutional: no weight gain/loss, no fatigue, no subjective hyperthermia/hypothermia Eyes: no blurry vision, no xerophthalmia ENT: no sore throat, no nodules palpated in throat, no dysphagia/odynophagia, no hoarseness Cardiovascular: no CP/SOB/palpitations/leg swelling Respiratory: no cough/SOB Gastrointestinal: no N/V/D/C Musculoskeletal: no muscle/joint aches Skin: no rashes Neurological: no tremors/numbness/tingling/dizziness   Objective:   Physical Exam BP 136/72  Pulse 62  Temp(Src) 98.2 F (36.8 C) (Oral)  Resp 12  Wt 152 lb (68.947 kg)  SpO2 98% Wt Readings from Last 3 Encounters:  04/15/14 152 lb (68.947 kg)  12/17/13 151 lb  (68.493 kg)  11/21/13 148 lb 4 oz (67.246 kg)   Constitutional: slightly overweight, in NAD Eyes: PERRLA, EOMI, no exophthalmos ENT: moist mucous membranes, plump thyroid, no thyromegaly, no cervical lymphadenopathy Cardiovascular: RRR, No MRG Respiratory: CTA B Gastrointestinal: abdomen soft, NT, ND, BS+ Musculoskeletal: Heberden and Bouchard nodules, strength intact in all 4 Skin: moist, warm, no rashes Neurological: no tremor with outstretched hands, DTR normal in all 4  Assessment:     1. 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 - we decided to schedule another visit in 1 year, but repeat labs likely in 6 months, at the appt with PCP  Component     Latest Ref Rng 04/15/2014  TSH     0.35 - 4.50 uIU/mL 0.14 (L)  Free T4     0.60 - 1.60 ng/dL 0.91  T3, Free     2.3 - 4.2 pg/mL 2.8  TSh a little lower again, but, without associated hyperthyroid sxs, we will continue to watch the TFTs.

## 2014-04-15 NOTE — Patient Instructions (Signed)
Please stop at the lab. Please return in 1 year. Please ask Dr Glori Bickers to check your thyroid tests when you see her in May.

## 2014-04-27 ENCOUNTER — Encounter: Payer: Self-pay | Admitting: Family Medicine

## 2014-04-27 ENCOUNTER — Ambulatory Visit (INDEPENDENT_AMBULATORY_CARE_PROVIDER_SITE_OTHER): Payer: Medicare Other | Admitting: Family Medicine

## 2014-04-27 VITALS — BP 134/80 | HR 64 | Temp 99.0°F | Wt 151.0 lb

## 2014-04-27 DIAGNOSIS — R3915 Urgency of urination: Secondary | ICD-10-CM

## 2014-04-27 DIAGNOSIS — N390 Urinary tract infection, site not specified: Secondary | ICD-10-CM

## 2014-04-27 LAB — POCT URINALYSIS DIPSTICK
BILIRUBIN UA: NEGATIVE
Blood, UA: NEGATIVE
GLUCOSE UA: NEGATIVE
KETONES UA: NEGATIVE
Nitrite, UA: NEGATIVE
Spec Grav, UA: 1.015
Urobilinogen, UA: 0.2
pH, UA: 7

## 2014-04-27 MED ORDER — SULFAMETHOXAZOLE-TMP DS 800-160 MG PO TABS: 1.0000 | ORAL_TABLET | Freq: Two times a day (BID) | ORAL | Status: DC

## 2014-04-27 NOTE — Assessment & Plan Note (Signed)
Nontoxic, septra, ucx, fluids, f/u prn.  She agrees. D/w pt.

## 2014-04-27 NOTE — Progress Notes (Signed)
Pre visit review using our clinic review tool, if applicable. No additional management support is needed unless otherwise documented below in the visit note.  She had prev seen uro, with cystoscopy prev done (neg) during w/u earlier in 2015.  She had done well in the meantime except for occ urinary incontinence.  In the last week she noted a change, more frequency.  Some urgency  Lower back pain noted (she has that with prev UTIs).  Some change in sensation some of the time with urination.  No abd pain.  No fevers.    Meds, vitals, and allergies reviewed.   ROS: See HPI.  Otherwise, noncontributory.  GEN: nad, alert and oriented HEENT: mucous membranes moist NECK: supple CV: rrr.  PULM: ctab, no inc wob ABD: soft, +bs, suprapubic area minimally tender EXT: no edema SKIN: no acute rash BACK: no CVA pain

## 2014-04-27 NOTE — Patient Instructions (Signed)
Drink plenty of water and start the antibiotics today.  We'll contact you with your lab report.  Take care.   

## 2014-04-29 ENCOUNTER — Other Ambulatory Visit: Payer: Self-pay | Admitting: Family Medicine

## 2014-04-29 ENCOUNTER — Telehealth: Payer: Self-pay

## 2014-04-29 LAB — URINE CULTURE: Colony Count: 60000

## 2014-04-29 MED ORDER — SULFAMETHOXAZOLE-TMP DS 800-160 MG PO TABS: 1.0000 | ORAL_TABLET | Freq: Two times a day (BID) | ORAL | Status: DC

## 2014-04-29 NOTE — Telephone Encounter (Signed)
I need the sensitivity report.  It may be back later today.  Let me get that and we'll see about adjusting the abx. I didn't change the abx yet, since the extra info from the ucx should be available soon.  Thanks.

## 2014-04-29 NOTE — Telephone Encounter (Signed)
Pt left v/m; pt was seen 04/27/14 and started antibiotic; 04/28/14 pt felt better but today pt has back pain and frequency of urine; pt only has one more antibiotic to take; pt is going out of town on 05/03/14 and request different antibiotic to [facility]. Pt request cb.

## 2014-04-29 NOTE — Telephone Encounter (Signed)
Unable to leave message at home number, no answering machine. Left message on cell to call back.

## 2014-04-29 NOTE — Telephone Encounter (Signed)
Patient notified as instructed by telephone. 

## 2014-05-18 ENCOUNTER — Other Ambulatory Visit (INDEPENDENT_AMBULATORY_CARE_PROVIDER_SITE_OTHER): Payer: Medicare Other

## 2014-05-18 DIAGNOSIS — Z23 Encounter for immunization: Secondary | ICD-10-CM

## 2014-06-29 ENCOUNTER — Telehealth: Payer: Self-pay | Admitting: Family Medicine

## 2014-06-29 NOTE — Telephone Encounter (Signed)
Electronic refill request, pt has had a few acute appt., but no recent f/u or CPE with you, please advise

## 2014-06-29 NOTE — Telephone Encounter (Signed)
Please schedule annual exam after 5/15 and refill until then

## 2014-06-30 NOTE — Telephone Encounter (Signed)
Left message asking pt to call office please schedule appointment

## 2014-06-30 NOTE — Telephone Encounter (Signed)
See prev. note please schedule CPE and then I will refill Rx

## 2014-06-30 NOTE — Telephone Encounter (Signed)
Scheduled cpe 12/01/13  Please refill and close

## 2014-06-30 NOTE — Telephone Encounter (Signed)
Med refilled.

## 2014-07-07 ENCOUNTER — Other Ambulatory Visit: Payer: Self-pay | Admitting: *Deleted

## 2014-07-07 MED ORDER — OMEPRAZOLE 20 MG PO CPDR: DELAYED_RELEASE_CAPSULE | ORAL | Status: DC

## 2014-07-18 ENCOUNTER — Other Ambulatory Visit: Payer: Self-pay | Admitting: Family Medicine

## 2014-07-24 ENCOUNTER — Other Ambulatory Visit: Payer: Self-pay | Admitting: Family Medicine

## 2014-07-28 ENCOUNTER — Other Ambulatory Visit: Payer: Self-pay

## 2014-07-28 DIAGNOSIS — Z1231 Encounter for screening mammogram for malignant neoplasm of breast: Secondary | ICD-10-CM

## 2014-08-06 ENCOUNTER — Other Ambulatory Visit: Payer: Self-pay

## 2014-08-06 ENCOUNTER — Encounter: Payer: Self-pay | Admitting: *Deleted

## 2014-08-06 ENCOUNTER — Ambulatory Visit
Admission: RE | Admit: 2014-08-06 | Discharge: 2014-08-06 | Disposition: A | Discharge status: Home / Self Care | Payer: Medicare Other | Source: Ambulatory Visit

## 2014-08-06 DIAGNOSIS — Z1231 Encounter for screening mammogram for malignant neoplasm of breast: Secondary | ICD-10-CM

## 2014-08-06 LAB — HM MAMMOGRAPHY: HM Mammogram: NORMAL

## 2014-08-10 ENCOUNTER — Encounter: Payer: Self-pay | Admitting: Internal Medicine

## 2014-08-10 ENCOUNTER — Ambulatory Visit (INDEPENDENT_AMBULATORY_CARE_PROVIDER_SITE_OTHER): Payer: Medicare Other | Admitting: Internal Medicine

## 2014-08-10 ENCOUNTER — Telehealth: Payer: Self-pay | Admitting: Family Medicine

## 2014-08-10 VITALS — BP 128/78 | HR 66 | Temp 97.1°F | Wt 154.0 lb

## 2014-08-10 DIAGNOSIS — R350 Frequency of micturition: Secondary | ICD-10-CM

## 2014-08-10 DIAGNOSIS — N898 Other specified noninflammatory disorders of vagina: Secondary | ICD-10-CM

## 2014-08-10 DIAGNOSIS — R3915 Urgency of urination: Secondary | ICD-10-CM

## 2014-08-10 DIAGNOSIS — L298 Other pruritus: Secondary | ICD-10-CM

## 2014-08-10 DIAGNOSIS — R300 Dysuria: Secondary | ICD-10-CM

## 2014-08-10 DIAGNOSIS — IMO0001 Reserved for inherently not codable concepts without codable children: Secondary | ICD-10-CM

## 2014-08-10 NOTE — Patient Instructions (Signed)
Asymptomatic Bacteriuria Asymptomatic bacteriuria is the presence of a large number of bacteria in your urine without the usual symptoms of burning or frequent urination. The following conditions increase the risk of asymptomatic bacteriuria:  Diabetes mellitus.  Advanced age.  Pregnancy in the first trimester.  Kidney stones.  Kidney transplants.  Leaky kidney tube valve in young children (reflux). Treatment for this condition is not needed in most people and can lead to other problems such as too much yeast and growth of resistant bacteria. However, some people, such as pregnant women, do need treatment to prevent kidney infection. Asymptomatic bacteriuria in pregnancy is also associated with fetal growth restriction, premature labor, and newborn death. HOME CARE INSTRUCTIONS Monitor your condition for any changes. The following actions may help to relieve any discomfort you are feeling:  Drink enough water and fluids to keep your urine clear or pale yellow. Go to the bathroom more often to keep your bladder empty.  Keep the area around your vagina and rectum clean. Wipe yourself from front to back after urinating. SEEK IMMEDIATE MEDICAL CARE IF:  You develop signs of an infection such as:  Burning with urination.  Frequency of voiding.  Back pain.  Fever.  You have blood in the urine.  You develop a fever. MAKE SURE YOU:  Understand these instructions.  Will watch your condition.  Will get help right away if you are not doing well or get worse. Document Released: 06/26/2005 Document Revised: 11/10/2013 Document Reviewed: 12/16/2012 Exit[employer] Patient Information 2015 [employer]. This information is not intended to replace advice given to you by your health care provider. Make sure you discuss any questions you have with your health care provider.  

## 2014-08-10 NOTE — Progress Notes (Signed)
Pre visit review using our clinic review tool, if applicable. No additional management support is needed unless otherwise documented below in the visit note. 

## 2014-08-10 NOTE — Progress Notes (Signed)
HPI  Pt presents to the clinic today with c/o urgency, frequency, chills and strong odor to urine. She reports that she has not noticed any vaginal discharge, but does feel some itching and burning in the vaginal area. She reports she feels like she has never fully gotten over her UTI from 04/2014. She is sexually active. She reports her husband is not circumcised and when she was younger she was prone to frequent yeast infections. She has tried drinking cranberry juice without relief. She denies fever, chills or body aches. She has been seen by a urologist and was not able to find an answer.  Review of Systems  Past Medical History  Diagnosis Date  . Cataract     bil  . Hyperlipidemia   . Arthritis   . Osteoporosis   . Rectal pain   . HTN (hypertension)   . OA (osteoarthritis)     hands  . GERD (gastroesophageal reflux disease)   . Allergic rhinitis   . Esophagitis   . HH (hiatus hernia)   . Colon polyps   . Increased pressure in the eye     Family History  Problem Relation Age of Onset  . Colon cancer Father   . Pneumonia Father   . Thyroid disease Mother   . Depression Mother   . Hypertension Mother   . Osteoporosis Mother   . Alcohol abuse Brother     terminal   . Alcohol abuse Brother   . Liver disease Brother     from alcohol  . Thyroid disease      half sister  . Breast cancer Maternal Aunt   . Colon cancer      Aunt  . Breast cancer Paternal Aunt   . Breast cancer Paternal Aunt     History   Social History  . Marital Status: Married    Spouse Name: N/A    Number of Children: 2  . Years of Education: N/A   Occupational History  . Retired    Social History Main Topics  . Smoking status: Never Smoker   . Smokeless tobacco: Never Used  . Alcohol Use: No  . Drug Use: No  . Sexual Activity: Not on file   Other Topics Concern  . Not on file   Social History Narrative   Married      2 children      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 with  husband,  and cares for her brother--who had alcohol and stroke      Cared for elderly mother for years (she passed in 2009)    No Known Allergies  Constitutional: Denies fever, malaise, fatigue, headache or abrupt weight changes.   GU: Pt reports urgency, frequency, odor, itching, and pain with urination. Denies burning sensation, blood in urine or discharge. Skin: Denies redness, rashes, lesions or ulcercations.   No other specific complaints in a complete review of systems (except as listed in HPI above).    Objective:   Physical Exam  BP 128/78 mmHg  Pulse 66  Temp(Src) 97.1 F (36.2 C) (Oral)  Wt 154 lb (69.854 kg)  SpO2 99% Wt Readings from Last 3 Encounters:  08/10/14 154 lb (69.854 kg)  04/27/14 151 lb (68.493 kg)  04/15/14 152 lb (68.947 kg)    General: Appears her stated age, well developed, well nourished in NAD. Cardiovascular: Normal rate and rhythm. S1,S2 noted.  No murmur, rubs or gallops noted.  Pulmonary/Chest: Normal effort and positive vesicular  breath sounds. No respiratory distress. No wheezes, rales or ronchi noted.  Abdomen: Soft and nontender. Normal bowel sounds, no bruits noted. No distention or masses noted. Liver, spleen and kidneys non palpable.  No CVA tenderness. Pelvic: Normal female anatomy. No Cervix or Uterus noted. Adnexa non palpable. No discharge noted.      Assessment & Plan:   Urgency, Frequency, Dysuria, Vaginal Itching:  Urinalysis: normal Pelvic exam: Neg trich, clue or yeast noted ? If this is OAB, but had a normal workup by urology Will refer to gyn for further evaluation Drink plenty of fluids  RTC as needed or if symptoms persist.

## 2014-08-10 NOTE — Telephone Encounter (Signed)
She was given appt with Webb Silversmith

## 2014-08-10 NOTE — Telephone Encounter (Signed)
[facility] Patient Name: Deborah Gardner DOB: Nov 15, 1945 Initial Comment caller states she may have a UTI - can she get an rx for this Nurse Assessment Nurse: Donalynn Furlong, RN, Myna Hidalgo Date/Time Eilene Ghazi Time): 08/10/2014 10:04:44 AM Confirm and document reason for call. If symptomatic, describe symptoms. ---caller states she may have a UTI - can she get an rx for this. Pt is having odor in her urine, urgency, and back pain on both sides Has the patient traveled out of the country within the last 30 days? ---No Does the patient require triage? ---Yes Related visit to physician within the last 2 weeks? ---No Does the PT have any chronic conditions? (i.e. diabetes, asthma, etc.) ---Yes List chronic conditions. ---high blood pressure Guidelines Guideline Title Affirmed Question Affirmed Notes Urination Pain - Female Age > 52 years Final Disposition User See Physician within [place], Therapist, sports, Myna Hidalgo

## 2014-08-12 ENCOUNTER — Other Ambulatory Visit: Payer: Self-pay | Admitting: Family Medicine

## 2014-08-12 ENCOUNTER — Ambulatory Visit: Payer: Medicare Other | Admitting: Family Medicine

## 2014-08-14 ENCOUNTER — Encounter: Payer: Medicare Other | Admitting: Family Medicine

## 2014-08-20 ENCOUNTER — Ambulatory Visit (INDEPENDENT_AMBULATORY_CARE_PROVIDER_SITE_OTHER): Payer: Medicare Other | Admitting: Obstetrics & Gynecology

## 2014-08-20 ENCOUNTER — Encounter: Payer: Self-pay | Admitting: Obstetrics & Gynecology

## 2014-08-20 ENCOUNTER — Telehealth: Payer: Self-pay | Admitting: *Deleted

## 2014-08-20 VITALS — BP 131/73 | HR 62 | Ht 62.0 in | Wt 151.0 lb

## 2014-08-20 DIAGNOSIS — N952 Postmenopausal atrophic vaginitis: Secondary | ICD-10-CM

## 2014-08-20 MED ORDER — ESTRADIOL 0.1 MG/GM VA CREA: 2.0000 g | TOPICAL_CREAM | Freq: Every day | VAGINAL | Status: DC

## 2014-08-20 NOTE — Patient Instructions (Signed)

## 2014-08-20 NOTE — Progress Notes (Signed)
She has seen a urologist, they did an ultrasound and looked inside her bladder and said everything looked ok.  Before this she had two urinary tract infections.  She saw Dr. Glori Bickers a few days ago and she is having an itchy burning feeling like she might have a discharge but they did not see anything when they checked her.  Her urine was clear and they were not able to note any discharge.  She does not have pain with intercourse but does feel an "itchy" sensation deep inside when they have intercourse.  She feels like she is well lubricated during intercourse and does not need to use additional lubrication.  She does have pain sometimes that feels like a UTI does almost like a spasm.

## 2014-08-20 NOTE — Progress Notes (Signed)
Subjective:     Patient ID: Deborah Gardner, female   DOB: 17-May-1946, 69 y.o.   MRN: [id number]  HPI Pt reports having freq UTI's.  She has seen a urologist.  Sx started 'a while ago' she cannot narrow it down.  She reports an 'itchy bruning feeling' with freq uriniation..  Sometimes the urine has an odor but, she thinks that it is based on how much water she drinks.  She feels like some thing is wrong and is here for further eval.  Pt does not feel 'dry'  She is sexually active but, has no discomfort but, has 'itching' with deep penetration.  Pt is s/p a hyst for pelvic pain and 'precancerous cysts' at age 46years.  Has ovaries.   Past Surgical History  Procedure Laterality Date  . Abdominal hysterectomy      still has ovaries  . Hemorrhoid surgery    . Retinal laser surgery    . Breast cyst aspiration  1/03  . Cataract extraction  9/07  . Esophagogastroduodenoscopy  11/08  . Appendectomy    . Rectal sphincterotomy     Current Outpatient Prescriptions on File Prior to Visit  Medication Sig Dispense Refill  . acetaminophen (TYLENOL) 500 MG tablet Take 1,000 mg by mouth every 6 (six) hours as needed for mild pain.     Marland Kitchen amLODipine-benazepril (LOTREL) 5-10 MG per capsule TAKE ONE CAPSULE ONCE DAILY 90 capsule 1  . aspirin 81 MG tablet Take 81 mg by mouth daily.    Marland Kitchen atorvastatin (LIPITOR) 20 MG tablet TAKE 1 TABLET ONCE DAILY 90 tablet 1  . Calcium Carb-Cholecalciferol (CALCIUM 600 + D PO) Take 1 tablet by mouth daily.    . Cholecalciferol (VITAMIN D) 1000 UNITS capsule Take 1,000 Units by mouth daily.     . cyanocobalamin 1000 MCG tablet Take 100 mcg by mouth daily.    . fish oil-omega-3 fatty acids 1000 MG capsule Take 1 g by mouth daily.     . fluticasone (FLONASE) 50 MCG/ACT nasal spray PLACE 2 SPRAYS INTO THE NOSE DAILY. 16 g 3  . ibuprofen (ADVIL,MOTRIN) 200 MG tablet Take 200 mg by mouth every 6 (six) hours as needed for pain.    . Multiple Vitamin (MULTIVITAMIN PO) Take by mouth  daily.      . NON FORMULARY Arthaffect (one scoop daily with apple juice)     . omeprazole (PRILOSEC) 20 MG capsule TAKE 1 CAPSULE (20 MG TOTAL) BY MOUTH DAILY. 90 capsule 1  . timolol (TIMOPTIC) 0.5 % ophthalmic solution Place 1 drop into both eyes 2 (two) times daily.     Marland Kitchen ALPRAZolam (XANAX) 0.5 MG tablet Take 0.5 mg by mouth daily as needed.     No current facility-administered medications on file prior to visit.        Review of Systems     Objective:   Physical Exam BP 131/73 mmHg  Pulse 62  Ht 5\' 2"  (1.575 m)  Wt 151 lb (68.493 kg)  BMI 27.61 kg/m2 GU: EGBUS: no lesions; atrophy of the vulva and the vagina.  Vagina: no blood in vault; cuff well healed Cervix/uterus: surgically absent Adnexa: no masses; non tender         Assessment:     Vaginal atrophy would be the cause of freq UTI and vaginal sx as well as urinary freq.  D/W pt use and side effects of vaginal EES.     Plan:     Estrace cream 1/3 applicatorful  qhs x 1 week followed by 3x/week Note to Dr Ventura Sellers

## 2014-08-20 NOTE — Telephone Encounter (Signed)
Patient called back and said the cost of the medication was too expensive and you had said you had an option that wouldn't be but 25.00 or so.

## 2014-08-25 ENCOUNTER — Telehealth: Payer: Self-pay | Admitting: *Deleted

## 2014-08-25 MED ORDER — ESTROGENS, CONJUGATED 0.625 MG/GM VA CREA: 1.0000 | TOPICAL_CREAM | Freq: Every day | VAGINAL | Status: DC

## 2014-08-25 NOTE — Telephone Encounter (Signed)
-----   Message from Lavonia Drafts, MD sent at 08/20/2014  3:28 PM EST ----- Let's call the pharmacy and ask about Premarin cream.  They often have a coupon from the company that will make it ~$25.00.  The instructions would be the same.  Let me know.  Thx, clh-S

## 2014-09-16 ENCOUNTER — Ambulatory Visit: Payer: Medicare Other | Admitting: Obstetrics & Gynecology

## 2014-09-30 ENCOUNTER — Ambulatory Visit: Payer: Medicare Other | Admitting: Obstetrics and Gynecology

## 2014-10-06 ENCOUNTER — Telehealth: Payer: Self-pay

## 2014-10-06 NOTE — Telephone Encounter (Signed)
Pt left v/m; pt was recently talking with [facility] in home care and [facility] rep advised pt that omeprazole could cause forgetfulness. Pt is having slight problem with forgetfulness. Pt has been taking for a long time; pt only has 2 pills left and request cb. [facility].

## 2014-10-07 NOTE — Telephone Encounter (Signed)
Patient notified as instructed by telephone and verbalized understanding. Patient stated that she has a physical coming up with Dr. Glori Bickers in May and will see how she does in the meantime. Patient stated that she will call back if she feels that she needs to see Dr. Glori Bickers before her appointment in May.

## 2014-10-07 NOTE — Telephone Encounter (Signed)
She can try stopping it to see if she can tolerate being off the medicine.  She can restart if she has GERD sx return. She should have a refill remaining.  Either way, needs OV with PCP re: forgetfulness.

## 2014-10-07 NOTE — Telephone Encounter (Signed)
Left message at home number and cell to call back. 

## 2014-11-02 ENCOUNTER — Encounter: Payer: Self-pay | Admitting: Family Medicine

## 2014-11-02 ENCOUNTER — Ambulatory Visit (INDEPENDENT_AMBULATORY_CARE_PROVIDER_SITE_OTHER): Payer: Medicare Other | Admitting: Family Medicine

## 2014-11-02 VITALS — BP 112/72 | HR 57 | Temp 98.7°F | Ht 62.0 in | Wt 148.2 lb

## 2014-11-02 DIAGNOSIS — K2100 Gastro-esophageal reflux disease with esophagitis, without bleeding: Secondary | ICD-10-CM

## 2014-11-02 DIAGNOSIS — F419 Anxiety disorder, unspecified: Secondary | ICD-10-CM | POA: Diagnosis not present

## 2014-11-02 MED ORDER — OMEPRAZOLE 20 MG PO CPDR: 20.0000 mg | DELAYED_RELEASE_CAPSULE | Freq: Two times a day (BID) | ORAL | Status: DC

## 2014-11-02 MED ORDER — ALPRAZOLAM 0.5 MG PO TABS: 0.5000 mg | ORAL_TABLET | Freq: Every day | ORAL | Status: DC | PRN

## 2014-11-02 NOTE — Patient Instructions (Addendum)
Start omeprazole 20 mg twice daily (am and pm) Continue zantac (ranitidine) 150 mg at night as well  Avoid foods that give you stomach problems Use xanax as needed for anxiety -caution of sedation  Follow up next month as planned (let me know if symptoms worsen in the meantime)

## 2014-11-02 NOTE — Progress Notes (Signed)
Subjective:    Patient ID: Deborah Gardner, female    DOB: June 11, 1946, 69 y.o.   MRN: [id number]  HPI Here for acid reflux   Was on omeprazole once daily Was told by her [facility] nurse that it can cause some forgetfulness and also OP  She stopped taking it and symptoms got much worse  She bought zantac 150 mg bid -helps a little - but still symptomatic "boiling" sensation in stomach- and nausea now as much    Reflux symptoms - include heartburn and regurg of acid into mouth at night  On omeprazole 20 once daily- she still had some breakthrough symptoms    Has xanax for pm anxiety- does not use it as often  She is anxious more lately after loosing her brother (she cared for him), and other sick family members Also DIL in car accident  One thing after another  GI problems are making her more anxious   Does not sleep well and gets overwhelmed easily- that makes it harder to concentrate   Patient Active Problem List   Diagnosis Date Noted  . UTI (lower urinary tract infection) 04/27/2014  . Encounter for Medicare annual wellness exam 11/21/2013  . Anxiety 11/21/2013  . Dysuria 07/25/2013  . Subclinical hyperthyroidism 06/18/2012  . Pruritus ani 03/08/2011  . UNSPECIFIED ESOPHAGITIS 08/12/2010  . OSTEOARTHRITIS, GENERALIZED, HAND 03/21/2010  . 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 ROTATOR CUFF SYNDROME, LEFT 03/21/2010  . ALLERGIC RHINITIS 11/03/2008  . ARTHRALGIA 10/08/2008  . ADENOMATOUS COLONIC POLYP 08/19/2007  . ESOPHAGITIS, REFLUX 08/19/2007  . HIATAL HERNIA 08/19/2007  . HYPERLIPIDEMIA 03/05/2007  . G E R D 03/05/2007  . OSTEOPENIA 03/05/2007  . HYPERTENSION 02/08/2007  . IBS 02/08/2007  . FIBROCYSTIC BREAST DISEASE 02/08/2007  . OSTEOARTHRITIS 02/08/2007  . [place] DISEASE, LUMBAR 02/08/2007  . STRESS INCONTINENCE 02/08/2007   Past Medical History  Diagnosis Date  . Cataract     bil  . Hyperlipidemia   . Arthritis   . Osteoporosis   . Rectal pain   . HTN  (hypertension)   . OA (osteoarthritis)     hands  . GERD (gastroesophageal reflux disease)   . Allergic rhinitis   . Esophagitis   . HH (hiatus hernia)   . Colon polyps   . Increased pressure in the eye    Past Surgical History  Procedure Laterality Date  . Abdominal hysterectomy      still has ovaries  . Hemorrhoid surgery    . Retinal laser surgery    . Breast cyst aspiration  1/03  . Cataract extraction  9/07  . Esophagogastroduodenoscopy  11/08  . Appendectomy    . Rectal sphincterotomy     History  Substance Use Topics  . Smoking status: Never Smoker   . Smokeless tobacco: Never Used  . Alcohol Use: No   Family History  Problem Relation Age of Onset  . Colon cancer Father   . Pneumonia Father   . Thyroid disease Mother   . Depression Mother   . Hypertension Mother   . Osteoporosis Mother   . Alcohol abuse Brother     terminal   . Alcohol abuse Brother   . Liver disease Brother     from alcohol  . Thyroid disease      half sister  . Breast cancer Maternal Aunt   . Colon cancer      Aunt  . Breast cancer Paternal Aunt   . Breast cancer Paternal Aunt  No Known Allergies Current Outpatient Prescriptions on File Prior to Visit  Medication Sig Dispense Refill  . acetaminophen (TYLENOL) 500 MG tablet Take 1,000 mg by mouth every 6 (six) hours as needed for mild pain.     Marland Kitchen amLODipine-benazepril (LOTREL) 5-10 MG per capsule TAKE ONE CAPSULE ONCE DAILY 90 capsule 1  . aspirin 81 MG tablet Take 81 mg by mouth daily.    Marland Kitchen atorvastatin (LIPITOR) 20 MG tablet TAKE 1 TABLET ONCE DAILY 90 tablet 1  . Calcium Carb-Cholecalciferol (CALCIUM 600 + D PO) Take 1 tablet by mouth daily.    . Cholecalciferol (VITAMIN D) 1000 UNITS capsule Take 1,000 Units by mouth daily.     Marland Kitchen conjugated estrogens (PREMARIN) vaginal cream Place 1 Applicatorful vaginally daily. 42.5 g 12  . cyanocobalamin 1000 MCG tablet Take 100 mcg by mouth daily.    . fish oil-omega-3 fatty acids 1000 MG  capsule Take 1 g by mouth daily.     . fluticasone (FLONASE) 50 MCG/ACT nasal spray PLACE 2 SPRAYS INTO THE NOSE DAILY. 16 g 3  . ibuprofen (ADVIL,MOTRIN) 200 MG tablet Take 200 mg by mouth every 6 (six) hours as needed for pain.    . Multiple Vitamin (MULTIVITAMIN PO) Take by mouth daily.      . NON FORMULARY Arthaffect (one scoop daily with apple juice)     . timolol (TIMOPTIC) 0.5 % ophthalmic solution Place 1 drop into both eyes 2 (two) times daily.      No current facility-administered medications on file prior to visit.    Review of Systems Review of Systems  Constitutional: Negative for fever, appetite change, fatigue and unexpected weight change.  Eyes: Negative for pain and visual disturbance.  Respiratory: Negative for cough and shortness of breath.   Cardiovascular: Negative for cp or palpitations    Gastrointestinal: Negative for , diarrhea and constipation. neg for dark /black stool , pos for acid regurgitation and epigastric pain  Genitourinary: Negative for urgency and frequency.  Skin: Negative for pallor or rash   Neurological: Negative for weakness, light-headedness, numbness and headaches.  Hematological: Negative for adenopathy. Does not bruise/bleed easily.  Psychiatric/Behavioral: Negative for dysphoric mood. The patient is not nervous/anxious.         Objective:   Physical Exam  Constitutional: She appears well-developed and well-nourished. No distress.  HENT:  Head: Normocephalic and atraumatic.  Mouth/Throat: Oropharynx is clear and moist.  Eyes: Conjunctivae and EOM are normal. Pupils are equal, round, and reactive to light. No scleral icterus.  Neck: Normal range of motion. Neck supple.  Cardiovascular: Normal rate and regular rhythm.   Pulmonary/Chest: Effort normal and breath sounds normal. No respiratory distress. She has no wheezes. She has no rales.  Abdominal: Soft. Bowel sounds are normal. She exhibits no distension and no mass. There is no  hepatosplenomegaly. There is tenderness in the epigastric area and left upper quadrant. There is no rigidity, no rebound, no guarding, no CVA tenderness, no tenderness at McBurney's point and negative Murphy's sign.  bs are mildly hyperactive   Musculoskeletal: She exhibits no edema.  Lymphadenopathy:    She has no cervical adenopathy.  Neurological: She is alert.  Skin: Skin is warm and dry. No pallor.  Psychiatric: She has a normal mood and affect.          Assessment & Plan:   Problem List Items Addressed This Visit      Digestive   G E R D - Primary  Much worse after stopping PPI [facility] nurse told her to at a home visit after disc of long range side eff)  Will re start omeprazole 20 mg at bid dosing  Continue zantac qhs also for now  Disc diet/handout given  Will continue to discuss poss side eff of PPi but at this time I think pros outweigh cons since her symptoms are severe       Relevant Medications   ranitidine (ZANTAC) 150 MG tablet   omeprazole (PRILOSEC) 20 MG capsule     Other   Anxiety    Refilled xanax for cautious prn use at night to sleep  Reviewed stressors/ coping techniques/symptoms/ support sources/ tx options and side effects in detail today If symptoms worsen may need to consider another tx Declines tx       Relevant Medications   ALPRAZolam (XANAX) 0.5 MG tablet

## 2014-11-02 NOTE — Assessment & Plan Note (Signed)
Refilled xanax for cautious prn use at night to sleep  Reviewed stressors/ coping techniques/symptoms/ support sources/ tx options and side effects in detail today If symptoms worsen may need to consider another tx Declines tx

## 2014-11-02 NOTE — Assessment & Plan Note (Signed)
Much worse after stopping PPI [facility] nurse told her to at a home visit after disc of long range side eff)  Will re start omeprazole 20 mg at bid dosing  Continue zantac qhs also for now  Disc diet/handout given  Will continue to discuss poss side eff of PPi but at this time I think pros outweigh cons since her symptoms are severe

## 2014-11-02 NOTE — Progress Notes (Signed)
Pre visit review using our clinic review tool, if applicable. No additional management support is needed unless otherwise documented below in the visit note. 

## 2014-11-23 ENCOUNTER — Telehealth: Payer: Self-pay | Admitting: Family Medicine

## 2014-11-23 DIAGNOSIS — I10 Essential (primary) hypertension: Secondary | ICD-10-CM

## 2014-11-23 DIAGNOSIS — E785 Hyperlipidemia, unspecified: Secondary | ICD-10-CM

## 2014-11-23 NOTE — Telephone Encounter (Signed)
-----   Message from Ellamae Sia sent at 11/20/2014  4:08 PM EDT ----- Regarding: Lab orders for Wednesday, 5.18.16 Patient is scheduled for CPX labs, please order future labs, Thanks , Karna Christmas

## 2014-11-25 ENCOUNTER — Other Ambulatory Visit (INDEPENDENT_AMBULATORY_CARE_PROVIDER_SITE_OTHER): Payer: Medicare Other

## 2014-11-25 DIAGNOSIS — E785 Hyperlipidemia, unspecified: Secondary | ICD-10-CM | POA: Diagnosis not present

## 2014-11-25 DIAGNOSIS — I10 Essential (primary) hypertension: Secondary | ICD-10-CM

## 2014-11-25 LAB — COMPREHENSIVE METABOLIC PANEL
ALK PHOS: 69 U/L (ref 39–117)
ALT: 12 U/L (ref 0–35)
AST: 16 U/L (ref 0–37)
Albumin: 4.3 g/dL (ref 3.5–5.2)
BUN: 13 mg/dL (ref 6–23)
CO2: 30 mEq/L (ref 19–32)
Calcium: 10 mg/dL (ref 8.4–10.5)
Chloride: 104 mEq/L (ref 96–112)
Creatinine, Ser: 0.85 mg/dL (ref 0.40–1.20)
GFR: 70 mL/min (ref >60.00)
Glucose, Bld: 99 mg/dL (ref 70–99)
Potassium: 4.3 mEq/L (ref 3.5–5.1)
SODIUM: 139 meq/L (ref 135–145)
TOTAL PROTEIN: 7.4 g/dL (ref 6.0–8.3)
Total Bilirubin: 0.5 mg/dL (ref 0.2–1.2)

## 2014-11-25 LAB — CBC WITH DIFFERENTIAL/PLATELET
BASOS PCT: 0.5 % (ref 0.0–3.0)
Basophils Absolute: 0 10*3/uL (ref 0.0–0.1)
EOS PCT: 3 % (ref 0.0–5.0)
Eosinophils Absolute: 0.2 10*3/uL (ref 0.0–0.7)
HCT: 37.7 % (ref 36.0–46.0)
HEMOGLOBIN: 12.8 g/dL (ref 12.0–15.0)
LYMPHS PCT: 26.9 % (ref 12.0–46.0)
Lymphs Abs: 2 10*3/uL (ref 0.7–4.0)
MCHC: 33.9 g/dL (ref 30.0–36.0)
MCV: 87.5 fl (ref 78.0–100.0)
MONOS PCT: 7.6 % (ref 3.0–12.0)
Monocytes Absolute: 0.6 10*3/uL (ref 0.1–1.0)
NEUTROS PCT: 62 % (ref 43.0–77.0)
Neutro Abs: 4.6 10*3/uL (ref 1.4–7.7)
Platelets: 233 10*3/uL (ref 150.0–400.0)
RBC: 4.31 Mil/uL (ref 3.87–5.11)
RDW: 13.1 % (ref 11.5–15.5)
WBC: 7.4 10*3/uL (ref 4.0–10.5)

## 2014-11-25 LAB — LIPID PANEL
Cholesterol: 155 mg/dL (ref 0–200)
HDL: 56.8 mg/dL (ref >39.00)
LDL CALC: 75 mg/dL (ref 0–99)
NONHDL: 98.2
Total CHOL/HDL Ratio: 3
Triglycerides: 118 mg/dL (ref 0.0–149.0)
VLDL: 23.6 mg/dL (ref 0.0–40.0)

## 2014-11-25 LAB — TSH: TSH: 0.55 u[IU]/mL (ref 0.35–4.50)

## 2014-12-02 ENCOUNTER — Ambulatory Visit (INDEPENDENT_AMBULATORY_CARE_PROVIDER_SITE_OTHER): Payer: Medicare Other | Admitting: Family Medicine

## 2014-12-02 ENCOUNTER — Encounter: Payer: Self-pay | Admitting: Family Medicine

## 2014-12-02 ENCOUNTER — Encounter: Payer: Self-pay | Admitting: *Deleted

## 2014-12-02 VITALS — BP 134/78 | HR 64 | Temp 98.5°F | Ht 60.5 in | Wt 149.5 lb

## 2014-12-02 DIAGNOSIS — Z23 Encounter for immunization: Secondary | ICD-10-CM | POA: Diagnosis not present

## 2014-12-02 DIAGNOSIS — M8580 Other specified disorders of bone density and structure, unspecified site: Secondary | ICD-10-CM | POA: Diagnosis not present

## 2014-12-02 DIAGNOSIS — Z0000 Encounter for general adult medical examination without abnormal findings: Secondary | ICD-10-CM

## 2014-12-02 DIAGNOSIS — I10 Essential (primary) hypertension: Secondary | ICD-10-CM | POA: Diagnosis not present

## 2014-12-02 DIAGNOSIS — E785 Hyperlipidemia, unspecified: Secondary | ICD-10-CM

## 2014-12-02 DIAGNOSIS — F419 Anxiety disorder, unspecified: Secondary | ICD-10-CM

## 2014-12-02 MED ORDER — RANITIDINE HCL 150 MG PO TABS: 150.0000 mg | ORAL_TABLET | Freq: Every day | ORAL | Status: DC

## 2014-12-02 MED ORDER — AMLODIPINE BESY-BENAZEPRIL HCL 5-10 MG PO CAPS: 1.0000 | ORAL_CAPSULE | Freq: Every day | ORAL | Status: DC

## 2014-12-02 MED ORDER — ATORVASTATIN CALCIUM 20 MG PO TABS
20.0000 mg | ORAL_TABLET | Freq: Every day | ORAL | Status: DC
Start: 2014-12-02 — End: 2015-12-06

## 2014-12-02 MED ORDER — OMEPRAZOLE 20 MG PO CPDR: 20.0000 mg | DELAYED_RELEASE_CAPSULE | Freq: Two times a day (BID) | ORAL | Status: DC

## 2014-12-02 NOTE — Assessment & Plan Note (Signed)
dexa 4/14  Stable osteopenia  No fx  No falls in the past year On ca and D Disc need for calcium/ vitamin D/ wt bearing exercise and bone density test every 2 y to monitor Disc safety/ fracture risk in detail    Will wait on next dexa in light of stability

## 2014-12-02 NOTE — Assessment & Plan Note (Signed)
Reviewed health habits including diet and exercise and skin cancer prevention Reviewed appropriate screening tests for age  Also reviewed health mt list, fam hx and immunization status , as well as social and family history   See HPI Labs reviewed Get a tetanus vaccine (Tdap) at the health department  prevnar vaccine today here Take care of yourself  Continue calcium and vitamin D  Labs are stable   Overall doing well

## 2014-12-02 NOTE — Assessment & Plan Note (Signed)
bp is stable today  No cp or palpitations or headaches or edema  No side effects to medicines  BP Readings from Last 3 Encounters:  12/02/14 134/78  11/02/14 112/72  08/20/14 131/73     Labs reviewed

## 2014-12-02 NOTE — Assessment & Plan Note (Signed)
Reviewed health habits including diet and exercise and skin cancer prevention Reviewed appropriate screening tests for age  Also reviewed health mt list, fam hx and immunization status , as well as social and family history   See HPI Labs reviewed Get a tetanus vaccine (Tdap) at the health department  prevnar vaccine today here Take care of yourself  Continue calcium and vitamin D  Labs are stable

## 2014-12-02 NOTE — Progress Notes (Signed)
Subjective:    Patient ID: Deborah Gardner, female    DOB: 1945-09-28, 69 y.o.   MRN: [id number]  HPI Here for annual medicare wellness visit as well as chronic/acute medical problems and preventative care   I have personally reviewed the Medicare Annual Wellness questionnaire and have noted 1. The patient's medical and social history 2. Their use of alcohol, tobacco or illicit drugs 3. Their current medications and supplements 4. The patient's functional ability including ADL's, fall risks, home safety risks and hearing or visual             impairment. 5. Diet and physical activities 6. Evidence for depression or mood disorders  The patients weight, height, BMI have been recorded in the chart and visual acuity is per eye clinic.  I have made referrals, counseling and provided education to the patient based review of the above and I have provided the pt with a written personalized care plan for preventive services. Reviewed and updated provider list, see scanned forms.  Her GERD is much much better with her ranitidine  and omeprazole   See scanned forms.  Routine anticipatory guidance given to patient.  See health maintenance. Colon cancer screening colonosc 10/14 with 5 year recall  Hep C screen -not high risk/ and declines  Breast cancer screening mam 1/16 nl  Self breast exam -no lumps but does not check often Flu vaccine 11/15  Tetanus vaccine 2/06  Pneumovax 9/12 , due for prevnar today  Zoster vaccine 5/11 DEXA 4/14 - osteopenia stable , no fractures and no new falls in the past year - will wait a little longer for next one Is taking her calcium and D  Advance directive - has a living will and power of attorney  Cognitive function addressed- see scanned forms- and if abnormal then additional documentation follows.  Some age related forgetfulness occasionally-nothing more than that   PMH and SH reviewed  Meds, vitals, and allergies reviewed.   ROS: See HPI.  Otherwise  negative.    Wt is up 1 lb with bmi of 28  Hyperlipidemia Lab Results  Component Value Date   CHOL 155 11/25/2014   CHOL 143 11/21/2013   CHOL 154 06/13/2012   Lab Results  Component Value Date   HDL 56.80 11/25/2014   HDL 64.20 11/21/2013   HDL 69.90 06/13/2012   Lab Results  Component Value Date   LDLCALC 75 11/25/2014   LDLCALC 58 11/21/2013   LDLCALC 64 06/13/2012   Lab Results  Component Value Date   TRIG 118.0 11/25/2014   TRIG 106.0 11/21/2013   TRIG 102.0 06/13/2012   Lab Results  Component Value Date   CHOLHDL 3 11/25/2014   CHOLHDL 2 11/21/2013   CHOLHDL 2 06/13/2012   No results found for: LDLDIRECT   lipitor and diet - staying in goal range  Diet is so/so -occ cheats / also does not eat as much as she used to   Hx of hyperthyroidism  Subclinical  Lab Results  Component Value Date   TSH 0.55 11/25/2014   watched by endocrinologist - Dr Cruzita Lederer   bp is stable today  No cp or palpitations or headaches or edema  No side effects to medicines  BP Readings from Last 3 Encounters:  12/02/14 134/78  11/02/14 112/72  08/20/14 131/73        Chemistry      Component Value Date/Time   NA 139 11/25/2014 0903   K 4.3 11/25/2014 6644  CL 104 11/25/2014 0903   CO2 30 11/25/2014 0903   BUN 13 11/25/2014 0903   CREATININE 0.85 11/25/2014 0903      Component Value Date/Time   CALCIUM 10.0 11/25/2014 0903   ALKPHOS 69 11/25/2014 0903   AST 16 11/25/2014 0903   ALT 12 11/25/2014 0903   BILITOT 0.5 11/25/2014 0903     glucose is good at 99  Lab Results  Component Value Date   WBC 7.4 11/25/2014   HGB 12.8 11/25/2014   HCT 37.7 11/25/2014   MCV 87.5 11/25/2014   PLT 233.0 11/25/2014    Patient Active Problem List   Diagnosis Date Noted  . UTI (lower urinary tract infection) 04/27/2014  . Encounter for Medicare annual wellness exam 11/21/2013  . Anxiety 11/21/2013  . Dysuria 07/25/2013  . Subclinical hyperthyroidism 06/18/2012  . Pruritus  ani 03/08/2011  . UNSPECIFIED ESOPHAGITIS 08/12/2010  . OSTEOARTHRITIS, GENERALIZED, HAND 03/21/2010  . 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 ROTATOR CUFF SYNDROME, LEFT 03/21/2010  . ALLERGIC RHINITIS 11/03/2008  . ARTHRALGIA 10/08/2008  . ADENOMATOUS COLONIC POLYP 08/19/2007  . ESOPHAGITIS, REFLUX 08/19/2007  . HIATAL HERNIA 08/19/2007  . Hyperlipidemia 03/05/2007  . G E R D 03/05/2007  . Osteopenia 03/05/2007  . Essential hypertension 02/08/2007  . IBS 02/08/2007  . FIBROCYSTIC BREAST DISEASE 02/08/2007  . OSTEOARTHRITIS 02/08/2007  . [place] DISEASE, LUMBAR 02/08/2007  . STRESS INCONTINENCE 02/08/2007   Past Medical History  Diagnosis Date  . Cataract     bil  . Hyperlipidemia   . Arthritis   . Osteoporosis   . Rectal pain   . HTN (hypertension)   . OA (osteoarthritis)     hands  . GERD (gastroesophageal reflux disease)   . Allergic rhinitis   . Esophagitis   . HH (hiatus hernia)   . Colon polyps   . Increased pressure in the eye    Past Surgical History  Procedure Laterality Date  . Abdominal hysterectomy      still has ovaries  . Hemorrhoid surgery    . Retinal laser surgery    . Breast cyst aspiration  1/03  . Cataract extraction  9/07  . Esophagogastroduodenoscopy  11/08  . Appendectomy    . Rectal sphincterotomy     History  Substance Use Topics  . Smoking status: Never Smoker   . Smokeless tobacco: Never Used  . Alcohol Use: No   Family History  Problem Relation Age of Onset  . Colon cancer Father   . Pneumonia Father   . Thyroid disease Mother   . Depression Mother   . Hypertension Mother   . Osteoporosis Mother   . Alcohol abuse Brother     terminal   . Alcohol abuse Brother   . Liver disease Brother     from alcohol  . Thyroid disease      half sister  . Breast cancer Maternal Aunt   . Colon cancer      Aunt  . Breast cancer Paternal Aunt   . Breast cancer Paternal Aunt    No Known Allergies Current Outpatient Prescriptions on  File Prior to Visit  Medication Sig Dispense Refill  . acetaminophen (TYLENOL) 500 MG tablet Take 1,000 mg by mouth every 6 (six) hours as needed for mild pain.     Marland Kitchen ALPRAZolam (XANAX) 0.5 MG tablet Take 1 tablet (0.5 mg total) by mouth daily as needed. 30 tablet 3  . amLODipine-benazepril (LOTREL) 5-10 MG per capsule TAKE ONE CAPSULE ONCE  DAILY 90 capsule 1  . aspirin 81 MG tablet Take 81 mg by mouth daily.    Marland Kitchen atorvastatin (LIPITOR) 20 MG tablet TAKE 1 TABLET ONCE DAILY 90 tablet 1  . Calcium Carb-Cholecalciferol (CALCIUM 600 + D PO) Take 1 tablet by mouth daily.    . Cholecalciferol (VITAMIN D) 1000 UNITS capsule Take 1,000 Units by mouth daily.     . cyanocobalamin 1000 MCG tablet Take 100 mcg by mouth daily.    . fish oil-omega-3 fatty acids 1000 MG capsule Take 1 g by mouth daily.     . fluticasone (FLONASE) 50 MCG/ACT nasal spray PLACE 2 SPRAYS INTO THE NOSE DAILY. (Patient taking differently: PLACE 2 SPRAYS INTO THE NOSE DAILY AS NEEDED) 16 g 3  . ibuprofen (ADVIL,MOTRIN) 200 MG tablet Take 200 mg by mouth every 6 (six) hours as needed for pain.    . Multiple Vitamin (MULTIVITAMIN PO) Take by mouth daily.      . NON FORMULARY Arthaffect (one scoop daily with apple juice)     . omeprazole (PRILOSEC) 20 MG capsule Take 1 capsule (20 mg total) by mouth 2 (two) times daily before a meal. 60 capsule 3  . ranitidine (ZANTAC) 150 MG tablet Take 150 mg by mouth at bedtime.     . timolol (TIMOPTIC) 0.5 % ophthalmic solution Place 1 drop into both eyes 2 (two) times daily.      No current facility-administered medications on file prior to visit.         Review of Systems Review of Systems  Constitutional: Negative for fever, appetite change, fatigue and unexpected weight change.  Eyes: Negative for pain and visual disturbance.  Respiratory: Negative for cough and shortness of breath.   Cardiovascular: Negative for cp or palpitations    Gastrointestinal: Negative for nausea, diarrhea  and constipation.  Genitourinary: Negative for urgency and frequency.  Skin: Negative for pallor or rash   Neurological: Negative for weakness, light-headedness, numbness and headaches.  Hematological: Negative for adenopathy. Does not bruise/bleed easily.  Psychiatric/Behavioral: Negative for dysphoric mood. The patient is not nervous/anxious.         Objective:   Physical Exam  Constitutional: She appears well-developed and well-nourished. No distress.  overwt and well appearing   HENT:  Head: Normocephalic and atraumatic.  Right Ear: External ear normal.  Left Ear: External ear normal.  Mouth/Throat: Oropharynx is clear and moist.  Eyes: Conjunctivae and EOM are normal. Pupils are equal, round, and reactive to light. No scleral icterus.  Neck: Normal range of motion. Neck supple. No JVD present. Carotid bruit is not present. No thyromegaly present.  Cardiovascular: Normal rate, regular rhythm, normal heart sounds and intact distal pulses.  Exam reveals no gallop.   Pulmonary/Chest: Effort normal and breath sounds normal. No respiratory distress. She has no wheezes. She exhibits no tenderness.  Abdominal: Soft. Bowel sounds are normal. She exhibits no distension, no abdominal bruit and no mass. There is no tenderness.  Genitourinary: No breast swelling, tenderness, discharge or bleeding.  Musculoskeletal: Normal range of motion. She exhibits no edema or tenderness.  Lymphadenopathy:    She has no cervical adenopathy.  Neurological: She is alert. She has normal reflexes. No cranial nerve deficit. She exhibits normal muscle tone. Coordination normal.  Skin: Skin is warm and dry. No rash noted. No erythema. No pallor.  Psychiatric: She has a normal mood and affect.          Assessment & Plan:   Problem List Items  Addressed This Visit    Anxiety    Xanax prn works well  Disc risks  Urine tox screen today      Encounter for Medicare annual wellness exam    Reviewed health  habits including diet and exercise and skin cancer prevention Reviewed appropriate screening tests for age  Also reviewed health mt list, fam hx and immunization status , as well as social and family history   See HPI Labs reviewed Get a tetanus vaccine (Tdap) at the health department  prevnar vaccine today here Take care of yourself  Continue calcium and vitamin D  Labs are stable       Essential hypertension - Primary    bp is stable today  No cp or palpitations or headaches or edema  No side effects to medicines  BP Readings from Last 3 Encounters:  12/02/14 134/78  11/02/14 112/72  08/20/14 131/73     Labs reviewed       Relevant Medications   amLODipine-benazepril (LOTREL) 5-10 MG per capsule   atorvastatin (LIPITOR) 20 MG tablet   Hyperlipidemia   Relevant Medications   amLODipine-benazepril (LOTREL) 5-10 MG per capsule   atorvastatin (LIPITOR) 20 MG tablet   Osteopenia    dexa 4/14  Stable osteopenia  No fx  No falls in the past year On ca and D Disc need for calcium/ vitamin D/ wt bearing exercise and bone density test every 2 y to monitor Disc safety/ fracture risk in detail    Will wait on next dexa in light of stability      Routine general medical examination at a health care facility    Reviewed health habits including diet and exercise and skin cancer prevention Reviewed appropriate screening tests for age  Also reviewed health mt list, fam hx and immunization status , as well as social and family history   See HPI Labs reviewed Get a tetanus vaccine (Tdap) at the health department  prevnar vaccine today here Take care of yourself  Continue calcium and vitamin D  Labs are stable   Overall doing well        Other Visit Diagnoses    Need for vaccination with 13-polyvalent pneumococcal conjugate vaccine        Relevant Orders    Pneumococcal conjugate vaccine 13-valent (Completed)

## 2014-12-02 NOTE — Assessment & Plan Note (Signed)
Xanax prn works well  Disc risks  Urine tox screen today

## 2014-12-02 NOTE — Patient Instructions (Signed)
Get a tetanus vaccine (Tdap) at the health department  prevnar vaccine today here Take care of yourself  Continue calcium and vitamin D  Labs are stable

## 2014-12-02 NOTE — Progress Notes (Signed)
Pre visit review using our clinic review tool, if applicable. No additional management support is needed unless otherwise documented below in the visit note. 

## 2014-12-12 ENCOUNTER — Other Ambulatory Visit: Payer: Self-pay | Admitting: Family Medicine

## 2014-12-30 ENCOUNTER — Encounter: Payer: Self-pay | Admitting: Family Medicine

## 2015-03-29 ENCOUNTER — Ambulatory Visit (INDEPENDENT_AMBULATORY_CARE_PROVIDER_SITE_OTHER): Payer: Medicare Other | Admitting: Family Medicine

## 2015-03-29 ENCOUNTER — Encounter: Payer: Self-pay | Admitting: Family Medicine

## 2015-03-29 VITALS — BP 116/78 | HR 58 | Temp 98.6°F | Ht 60.5 in | Wt 153.0 lb

## 2015-03-29 DIAGNOSIS — J029 Acute pharyngitis, unspecified: Secondary | ICD-10-CM | POA: Diagnosis not present

## 2015-03-29 DIAGNOSIS — J019 Acute sinusitis, unspecified: Secondary | ICD-10-CM | POA: Insufficient documentation

## 2015-03-29 DIAGNOSIS — J0100 Acute maxillary sinusitis, unspecified: Secondary | ICD-10-CM | POA: Diagnosis not present

## 2015-03-29 LAB — POCT RAPID STREP A (OFFICE): RAPID STREP A SCREEN: NEGATIVE

## 2015-03-29 MED ORDER — AMOXICILLIN-POT CLAVULANATE 875-125 MG PO TABS: 1.0000 | ORAL_TABLET | Freq: Two times a day (BID) | ORAL | Status: DC

## 2015-03-29 NOTE — Progress Notes (Signed)
Subjective:    Patient ID: Deborah Gardner, female    DOB: Aug 03, 1945, 69 y.o.   MRN: [id number]  HPI Here with uri symptoms   About 10 days ago started with a ST Then eyes watered and "ran" Took some otc all med and decongestant for a while -helped a little Then cold symptoms -congestion and rhinorrhea   Fri/sat a little better (she even donated blood) Then worse again - feels lousy Throat hurts    Results for orders placed or performed in visit on 03/29/15  Rapid Strep A  Result Value Ref Range   Rapid Strep A Screen Negative Negative    Exhausted   No fever that she knows of   Family is getting sick  A little nausea   Sinus headache comes and goes- dull and in her face around eyes   ? If mucous has color   Patient Active Problem List   Diagnosis Date Noted  . Routine general medical examination at a health care facility 12/02/2014  . UTI (lower urinary tract infection) 04/27/2014  . Encounter for Medicare annual wellness exam 11/21/2013  . Anxiety 11/21/2013  . Dysuria 07/25/2013  . Subclinical hyperthyroidism 06/18/2012  . Pruritus ani 03/08/2011  . UNSPECIFIED ESOPHAGITIS 08/12/2010  . OSTEOARTHRITIS, GENERALIZED, HAND 03/21/2010  . 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 ROTATOR CUFF SYNDROME, LEFT 03/21/2010  . ALLERGIC RHINITIS 11/03/2008  . ARTHRALGIA 10/08/2008  . ADENOMATOUS COLONIC POLYP 08/19/2007  . ESOPHAGITIS, REFLUX 08/19/2007  . HIATAL HERNIA 08/19/2007  . Hyperlipidemia 03/05/2007  . G E R D 03/05/2007  . Osteopenia 03/05/2007  . Essential hypertension 02/08/2007  . IBS 02/08/2007  . FIBROCYSTIC BREAST DISEASE 02/08/2007  . OSTEOARTHRITIS 02/08/2007  . [place] DISEASE, LUMBAR 02/08/2007  . STRESS INCONTINENCE 02/08/2007   Past Medical History  Diagnosis Date  . Cataract     bil  . Hyperlipidemia   . Arthritis   . Osteoporosis   . Rectal pain   . HTN (hypertension)   . OA (osteoarthritis)     hands  . GERD (gastroesophageal reflux  disease)   . Allergic rhinitis   . Esophagitis   . HH (hiatus hernia)   . Colon polyps   . Increased pressure in the eye    Past Surgical History  Procedure Laterality Date  . Abdominal hysterectomy      still has ovaries  . Hemorrhoid surgery    . Retinal laser surgery    . Breast cyst aspiration  1/03  . Cataract extraction  9/07  . Esophagogastroduodenoscopy  11/08  . Appendectomy    . Rectal sphincterotomy     Social History  Substance Use Topics  . Smoking status: Never Smoker   . Smokeless tobacco: Never Used  . Alcohol Use: No   Family History  Problem Relation Age of Onset  . Colon cancer Father   . Pneumonia Father   . Thyroid disease Mother   . Depression Mother   . Hypertension Mother   . Osteoporosis Mother   . Alcohol abuse Brother     terminal   . Alcohol abuse Brother   . Liver disease Brother     from alcohol  . Thyroid disease      half sister  . Breast cancer Maternal Aunt   . Colon cancer      Aunt  . Breast cancer Paternal Aunt   . Breast cancer Paternal Aunt    No Known Allergies Current Outpatient Prescriptions on File Prior to  Visit  Medication Sig Dispense Refill  . acetaminophen (TYLENOL) 500 MG tablet Take 1,000 mg by mouth every 6 (six) hours as needed for mild pain.     Marland Kitchen amLODipine-benazepril (LOTREL) 5-10 MG per capsule Take 1 capsule by mouth daily. 90 capsule 3  . aspirin 81 MG tablet Take 81 mg by mouth daily.    Marland Kitchen atorvastatin (LIPITOR) 20 MG tablet Take 1 tablet (20 mg total) by mouth daily. 90 tablet 3  . Calcium Carb-Cholecalciferol (CALCIUM 600 + D PO) Take 1 tablet by mouth daily.    . Cholecalciferol (VITAMIN D) 1000 UNITS capsule Take 1,000 Units by mouth daily.     . cyanocobalamin 1000 MCG tablet Take 100 mcg by mouth daily.    . fish oil-omega-3 fatty acids 1000 MG capsule Take 1 g by mouth daily.     . fluticasone (FLONASE) 50 MCG/ACT nasal spray PLACE 2 SPRAYS INTO THE NOSE DAILY. 16 g 4  . ibuprofen  (ADVIL,MOTRIN) 200 MG tablet Take 200 mg by mouth every 6 (six) hours as needed for pain.    . Multiple Vitamin (MULTIVITAMIN PO) Take by mouth daily.      . NON FORMULARY Arthaffect (one scoop daily with apple juice)     . omeprazole (PRILOSEC) 20 MG capsule Take 1 capsule (20 mg total) by mouth 2 (two) times daily before a meal. 180 capsule 3  . ranitidine (ZANTAC) 150 MG tablet Take 1 tablet (150 mg total) by mouth at bedtime. 90 tablet 3  . timolol (TIMOPTIC) 0.5 % ophthalmic solution Place 1 drop into both eyes 2 (two) times daily.     Marland Kitchen ALPRAZolam (XANAX) 0.5 MG tablet Take 1 tablet (0.5 mg total) by mouth daily as needed. (Patient not taking: Reported on 03/29/2015) 30 tablet 3   No current facility-administered medications on file prior to visit.     Review of Systems Review of Systems  Constitutional: Negative for fever, appetite change, and unexpected weight change. pos for fatigue and malaise  ENT pos for cong and rhinorrhea and drip/ ST and sinus pain  Eyes: Negative for pain and visual disturbance.  Respiratory: Negative for wheeze  and shortness of breath.   Cardiovascular: Negative for cp or palpitations    Gastrointestinal: Negative for nausea, diarrhea and constipation.  Genitourinary: Negative for urgency and frequency.  Skin: Negative for pallor or rash   Neurological: Negative for weakness, light-headedness, numbness and headaches.  Hematological: Negative for adenopathy. Does not bruise/bleed easily.  Psychiatric/Behavioral: Negative for dysphoric mood. The patient is not nervous/anxious.         Objective:   Physical Exam  Constitutional: She appears well-developed and well-nourished. No distress.  overwt and well app  HENT:  Head: Normocephalic and atraumatic.  Right Ear: External ear normal.  Left Ear: External ear normal.  Mouth/Throat: Oropharynx is clear and moist. No oropharyngeal exudate.  Nares are injected and congested  Bilateral maxillary sinus  tenderness  Post nasal drip   Eyes: Conjunctivae and EOM are normal. Pupils are equal, round, and reactive to light. Right eye exhibits no discharge. Left eye exhibits no discharge.  Neck: Normal range of motion. Neck supple.  Cardiovascular: Normal rate and regular rhythm.   Pulmonary/Chest: Effort normal and breath sounds normal. No respiratory distress. She has no wheezes. She has no rales.  Lymphadenopathy:    She has no cervical adenopathy.  Neurological: She is alert. No cranial nerve deficit.  Skin: Skin is warm and dry. No rash noted.  Psychiatric: She has a normal mood and affect.          Assessment & Plan:   Problem List Items Addressed This Visit      Respiratory   Acute sinusitis    Cover with augmentin  Disc symptomatic care - see instructions on AVS  Update if not starting to improve in a week or if worsening        Relevant Medications   amoxicillin-clavulanate (AUGMENTIN) 875-125 MG per tablet    Other Visit Diagnoses    Sore throat    -  Primary    Relevant Orders    Rapid Strep A (Completed)

## 2015-03-29 NOTE — Patient Instructions (Signed)
Take augmentin for sinus infection  Drink lots of water mucinex is ok for congestion   Breathe steam  Nasal saline spray may also be helpful  Update if not starting to improve in a week or if worsening

## 2015-03-29 NOTE — Assessment & Plan Note (Signed)
Cover with augmentin  Disc symptomatic care - see instructions on AVS  Update if not starting to improve in a week or if worsening   

## 2015-03-29 NOTE — Progress Notes (Signed)
Pre visit review using our clinic review tool, if applicable. No additional management support is needed unless otherwise documented below in the visit note. 

## 2015-07-08 ENCOUNTER — Ambulatory Visit
Admission: RE | Admit: 2015-07-08 | Discharge: 2015-07-08 | Disposition: A | Discharge status: Home / Self Care | Payer: Medicare Other | Source: Ambulatory Visit | Attending: Medical | Admitting: Medical

## 2015-07-08 ENCOUNTER — Ambulatory Visit (INDEPENDENT_AMBULATORY_CARE_PROVIDER_SITE_OTHER): Payer: Medicare Other | Admitting: Medical

## 2015-07-08 ENCOUNTER — Encounter: Payer: Self-pay | Admitting: Medical

## 2015-07-08 ENCOUNTER — Other Ambulatory Visit: Payer: Self-pay | Admitting: Medical

## 2015-07-08 VITALS — BP 120/76 | HR 71 | Temp 98.0°F | Resp 15 | Wt 151.0 lb

## 2015-07-08 DIAGNOSIS — J101 Influenza due to other identified influenza virus with other respiratory manifestations: Secondary | ICD-10-CM | POA: Diagnosis not present

## 2015-07-08 DIAGNOSIS — R52 Pain, unspecified: Secondary | ICD-10-CM | POA: Diagnosis not present

## 2015-07-08 DIAGNOSIS — Z20828 Contact with and (suspected) exposure to other viral communicable diseases: Secondary | ICD-10-CM

## 2015-07-08 DIAGNOSIS — R05 Cough: Secondary | ICD-10-CM

## 2015-07-08 DIAGNOSIS — R059 Cough, unspecified: Secondary | ICD-10-CM

## 2015-07-08 LAB — CBC WITH DIFFERENTIAL/PLATELET
BASOS ABS: 0 10*3/uL (ref 0.0–0.1)
BASOS PCT: 0 % (ref 0–1)
EOS PCT: 4 % (ref 0–5)
Eosinophils Absolute: 0.2 10*3/uL (ref 0.0–0.7)
HEMATOCRIT: 36.9 % (ref 36.0–46.0)
Hemoglobin: 12.1 g/dL (ref 12.0–15.0)
LYMPHS PCT: 24 % (ref 12–46)
Lymphs Abs: 1.3 10*3/uL (ref 0.7–4.0)
MCH: 29.5 pg (ref 26.0–34.0)
MCHC: 32.8 g/dL (ref 30.0–36.0)
MCV: 90 fL (ref 78.0–100.0)
MONO ABS: 0.8 10*3/uL (ref 0.1–1.0)
MPV: 11.6 fL (ref 8.6–12.4)
Monocytes Relative: 16 % — ABNORMAL HIGH (ref 3–12)
NEUTROS PCT: 56 % (ref 43–77)
Neutro Abs: 3 10*3/uL (ref 1.7–7.7)
PLATELETS: 188 10*3/uL (ref 150–400)
RBC: 4.1 MIL/uL (ref 3.87–5.11)
RDW: 12.6 % (ref 11.5–15.5)
WBC: 5.3 10*3/uL (ref 4.0–10.5)

## 2015-07-08 LAB — POC INFLUENZA A&B (BINAX/QUICKVUE)
INFLUENZA A, POC: POSITIVE — AB
Influenza B, POC: NEGATIVE

## 2015-07-08 MED ORDER — PROMETHAZINE-DM 6.25-15 MG/5ML PO SYRP: 5.0000 mL | ORAL_SOLUTION | Freq: Four times a day (QID) | ORAL | Status: DC | PRN

## 2015-07-08 MED ORDER — ALBUTEROL SULFATE HFA 108 (90 BASE) MCG/ACT IN AERS: 2.0000 | INHALATION_SPRAY | Freq: Four times a day (QID) | RESPIRATORY_TRACT | Status: DC | PRN

## 2015-07-08 NOTE — Progress Notes (Signed)
Subjective: Chief Complaint  Patient presents with  . cold?    over a week. took Human resources officer, thought it was allergies. said that then it went to her chest. monday her head and eyes hurt. nausea. will do flu swab. was at [employer] where half family has been diagnosed with flu. coughing so hard her ribs are sore.   Been feeling sick since Tuesday 2 day ago, but had cough for several days, mucous stuck in throat.  Congestion moved into the chest, coughing all the time, nothing seems to be coming up.  Ribs sore from coughing so much, lots of headache, eyes even hurt, feels a little off balance, sinus pressure, head congestion.   Having some chills. No SOB.  Denies fever.  Rarely runs fever though even when sick.    Taking Tussin DM for cough.  No other aggravating or relieving factors. No other complaint.   Past Medical History  Diagnosis Date  . Cataract     bil  . Hyperlipidemia   . Arthritis   . Osteoporosis   . Rectal pain   . HTN (hypertension)   . OA (osteoarthritis)     hands  . GERD (gastroesophageal reflux disease)   . Allergic rhinitis   . Esophagitis   . HH (hiatus hernia)   . Colon polyps   . Increased pressure in the eye    ROS as in subjective   Objective: BP 120/76 mmHg  Pulse 71  Temp(Src) 98 F (36.7 C) (Oral)  Resp 15  Wt 151 lb (68.493 kg)  SpO2 97%  General appearance: Alert, WD/WN, no distress, ill appearing                             Skin: warm, crusting cold sore of mid lower lip, otherwise no rash, no diaphoresis                           Head: no sinus tenderness                            Eyes: conjunctiva normal, corneas clear, PERRLA                            Ears: pearly TMs, external ear canals normal                          Nose: septum midline, turbinates swollen, with erythema and no discharge             Mouth/throat: MMM, tongue normal, mild pharyngeal erythema                           Neck: supple, no adenopathy, no  thyromegaly, non tender                          Heart: RRR, normal S1, S2, no murmurs                         Lungs: left lower fields decreased sounds, otherwise clear, no rhonchi, no wheezes, no rales                Extremities: no edema, non tender  Assessment: Encounter Diagnoses  Name Primary?  . Cough Yes  . Exposure to the flu   . Body aches   . Influenza A     Plan: Flu+.   Will send for CXR and STAT CBC.   Discussed flu diagnosis, supportive care, prevention, and avoiding infecting others.  discussed possible complications.   F/u pending labs,CXR.  Izza was seen today for cold?.  Diagnoses and all orders for this visit:  Cough -     DG Chest 2 View; Future -     CBC with Differential/Platelet  Exposure to the flu -     POC Influenza A&B -     DG Chest 2 View; Future -     CBC with Differential/Platelet  Body aches -     DG Chest 2 View; Future -     CBC with Differential/Platelet  Influenza A -     DG Chest 2 View; Future -     CBC with Differential/Platelet

## 2015-07-14 ENCOUNTER — Telehealth: Payer: Self-pay | Admitting: Family Medicine

## 2015-07-14 ENCOUNTER — Other Ambulatory Visit: Payer: Self-pay | Admitting: Medical

## 2015-07-14 DIAGNOSIS — R05 Cough: Secondary | ICD-10-CM

## 2015-07-14 DIAGNOSIS — J111 Influenza due to unidentified influenza virus with other respiratory manifestations: Secondary | ICD-10-CM

## 2015-07-14 DIAGNOSIS — R059 Cough, unspecified: Secondary | ICD-10-CM

## 2015-07-14 MED ORDER — AZITHROMYCIN 250 MG PO TABS: ORAL_TABLET | ORAL | Status: DC

## 2015-07-14 NOTE — Telephone Encounter (Signed)
States that she does not want to come back again since she was just here. She wants an antibiotic. She had a chest xray on the 29th and stated she has bronchitis. Pt was diagnosed with the flu at her last visit as well. She was adamant on not wanting to come back in.

## 2015-07-14 NOTE — Telephone Encounter (Signed)
Antibiotic sent, make sure she is using the albuterol inhaler

## 2015-07-14 NOTE — Telephone Encounter (Signed)
If still feeling this way, I recommend a recheck visit.   If desired can go for CXR first.   So please have her go this evening to [facility] or [facility] hospital this evening (upon arrival ask for radiology dept for outpatient xray) or [facility] tomorrow morning for CXR.

## 2015-07-14 NOTE — Telephone Encounter (Signed)
Pt called still very weak, trembling, wheezing in chest, cough spasm.  Would like to request an antibiotic called to [facility].  Please advise pt when called in [phone]

## 2015-07-15 NOTE — Telephone Encounter (Signed)
Pt is aware.  

## 2015-07-26 ENCOUNTER — Other Ambulatory Visit: Payer: Self-pay | Admitting: Family Medicine

## 2015-08-02 ENCOUNTER — Other Ambulatory Visit: Payer: Self-pay

## 2015-08-02 DIAGNOSIS — Z1231 Encounter for screening mammogram for malignant neoplasm of breast: Secondary | ICD-10-CM

## 2015-08-19 ENCOUNTER — Ambulatory Visit
Admission: RE | Admit: 2015-08-19 | Discharge: 2015-08-19 | Disposition: A | Discharge status: Home / Self Care | Payer: Medicare Other | Source: Ambulatory Visit

## 2015-08-19 DIAGNOSIS — Z1231 Encounter for screening mammogram for malignant neoplasm of breast: Secondary | ICD-10-CM

## 2015-08-19 LAB — HM MAMMOGRAPHY: HM Mammogram: NORMAL

## 2015-08-25 ENCOUNTER — Encounter: Payer: Self-pay | Admitting: *Deleted

## 2015-08-25 ENCOUNTER — Encounter: Payer: Self-pay | Admitting: Family Medicine

## 2015-12-06 ENCOUNTER — Other Ambulatory Visit: Payer: Self-pay | Admitting: Family Medicine

## 2016-01-01 ENCOUNTER — Encounter (HOSPITAL_COMMUNITY): Payer: Self-pay | Admitting: Emergency Medicine

## 2016-01-01 ENCOUNTER — Ambulatory Visit (HOSPITAL_COMMUNITY)
Admission: EM | Admit: 2016-01-01 | Discharge: 2016-01-01 | Disposition: A | Discharge status: Home / Self Care | Payer: Medicare Other | Attending: Emergency Medicine | Admitting: Emergency Medicine

## 2016-01-01 DIAGNOSIS — L259 Unspecified contact dermatitis, unspecified cause: Secondary | ICD-10-CM | POA: Diagnosis not present

## 2016-01-01 MED ORDER — METHYLPREDNISOLONE ACETATE 80 MG/ML IJ SUSP
80.0000 mg | Freq: Once | INTRAMUSCULAR | Status: AC
  Administered 2016-01-01: 80 mg via INTRAMUSCULAR

## 2016-01-01 MED ORDER — METHYLPREDNISOLONE ACETATE 80 MG/ML IJ SUSP
INTRAMUSCULAR | Status: AC
Start: 2016-01-01 — End: 2016-01-01
  Filled 2016-01-01: qty 1

## 2016-01-01 MED ORDER — PREDNISONE 20 MG PO TABS: ORAL_TABLET | ORAL | Status: DC

## 2016-01-01 NOTE — ED Notes (Signed)
Pt c/o rash on right arm and face onset yest... Believes she may have been exposed to poison oak She is A&O x4... No acute distress.

## 2016-01-01 NOTE — Discharge Instructions (Signed)
It looks like you came into contact with some poison ivy or poison oak. We gave you a steroid shot today to get things started. That the prednisone taper tomorrow. You should be done with this before your trip. You can use Benadryl lotion, calamine lotion, or hydrocortisone lotion to help with itching. Follow-up as needed.

## 2016-01-01 NOTE — ED Provider Notes (Signed)
CSN: [id number]     Arrival date & time 01/01/16  1408 History   First MD Initiated Contact with Patient 01/01/16 1510     Chief Complaint  Patient presents with  . Poison Oak   (Consider location/radiation/quality/duration/timing/severity/associated sxs/prior Treatment) HPI Deborah Gardner is a 70 year old woman here for evaluation of rash. Deborah Gardner states Deborah Gardner has been working in her yard a lot and may have come into contact with poison ivy or poison oak. Yesterday, Deborah Gardner noticed some itching on her left hand and arm as well as her face. Deborah Gardner has seen some papulovesicular lesions pop up. Deborah Gardner states this is what poison ivy looks like when Deborah Gardner gets it.  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  Past Medical History  Diagnosis Date  . Cataract     bil  . Hyperlipidemia   . Arthritis   . Osteoporosis   . Rectal pain   . HTN (hypertension)   . OA (osteoarthritis)     hands  . GERD (gastroesophageal reflux disease)   . Allergic rhinitis   . Esophagitis   . HH (hiatus hernia)   . Colon polyps   . Increased pressure in the eye    Past Surgical History  Procedure Laterality Date  . Abdominal hysterectomy      still has ovaries  . Hemorrhoid surgery    . Retinal laser surgery    . Breast cyst aspiration  1/03  . Cataract extraction  9/07  . Esophagogastroduodenoscopy  11/08  . Appendectomy    . Rectal sphincterotomy     Family History  Problem Relation Age of Onset  . Colon cancer Father   . Pneumonia Father   . Thyroid disease Mother   . Depression Mother   . Hypertension Mother   . Osteoporosis Mother   . Alcohol abuse Brother     terminal   . Alcohol abuse Brother   . Liver disease Brother     from alcohol  . Thyroid disease      half sister  . Breast cancer Maternal Aunt   . Colon cancer      Aunt  . Breast cancer Paternal Aunt   . Breast cancer Paternal Aunt     Social History  Substance Use Topics  . Smoking status: Never Smoker   . Smokeless tobacco: Never Used  . Alcohol Use: No   OB History    No data available     Review of Systems As in history of present illness Allergies  Review of patient's allergies indicates no known allergies.  Home Medications   Prior to Admission medications   Medication Sig Start Date End Date Taking? Authorizing Provider  amLODipine-benazepril (LOTREL) 5-10 MG capsule TAKE ONE CAPSULE ONCE DAILY 07/26/15  Yes Abner Greenspan, MD  aspirin 81 MG tablet Take 81 mg by mouth daily.   Yes Historical Provider, MD  atorvastatin (LIPITOR) 20 MG tablet TAKE 1 TABLET BY MOUTH EVERY DAY 12/07/15  Yes Abner Greenspan, MD  Calcium Carb-Cholecalciferol (CALCIUM 600 + D PO) Take 1 tablet by mouth daily.   Yes Historical Provider, MD  Cholecalciferol (VITAMIN D) 1000 UNITS capsule Take 1,000 Units by mouth daily.    Yes Historical Provider, MD  cyanocobalamin 1000 MCG tablet Take 100 mcg by mouth daily.   Yes Historical Provider, MD  fish oil-omega-3 fatty  acids 1000 MG capsule Take 1 g by mouth daily.    Yes Historical Provider, MD  Multiple Vitamin (MULTIVITAMIN PO) Take by mouth daily.     Yes Historical Provider, MD  omeprazole (PRILOSEC) 20 MG capsule Take 1 capsule (20 mg total) by mouth 2 (two) times daily before a meal. 12/02/14  Yes Abner Greenspan, MD  timolol (TIMOPTIC) 0.5 % ophthalmic solution Place 1 drop into both eyes 2 (two) times daily.  01/24/13  Yes Historical Provider, MD  acetaminophen (TYLENOL) 500 MG tablet Take 1,000 mg by mouth every 6 (six) hours as needed for mild pain.     Historical Provider, MD  albuterol (PROVENTIL HFA;VENTOLIN HFA) 108 (90 Base) MCG/ACT inhaler Inhale 2 puffs into the lungs every 6 (six) hours as needed for wheezing or shortness of breath. 07/08/15   Camelia Eng Tysinger, PA-C  ALPRAZolam Duanne Moron) 0.5 MG tablet Take 1 tablet (0.5 mg total) by mouth daily as needed. Patient not taking:  Reported on 03/29/2015 11/02/14   Abner Greenspan, MD  amLODipine-benazepril (LOTREL) 5-10 MG per capsule Take 1 capsule by mouth daily. 12/02/14   Abner Greenspan, MD  azithromycin (ZITHROMAX) 250 MG tablet 2 tablets day 1, then 1 tablet days 2-4 07/14/15   Camelia Eng Tysinger, PA-C  fluticasone (FLONASE) 50 MCG/ACT nasal spray PLACE 2 SPRAYS INTO THE NOSE DAILY. 12/14/14   Abner Greenspan, MD  ibuprofen (ADVIL,MOTRIN) 200 MG tablet Take 200 mg by mouth every 6 (six) hours as needed for pain. Reported on 07/08/2015    Historical Provider, MD  NON FORMULARY Arthaffect (one scoop daily with apple juice)     Historical Provider, MD  predniSONE (DELTASONE) 20 MG tablet Take 3 tablets for 4 days, then 2 tablets for 3 days, then 1 tablet for 3 days. 01/01/16   Melony Overly, MD  promethazine-dextromethorphan (PROMETHAZINE-DM) 6.25-15 MG/5ML syrup Take 5 mLs by mouth 4 (four) times daily as needed for cough. 07/08/15   Camelia Eng Tysinger, PA-C  ranitidine (ZANTAC) 150 MG tablet Take 1 tablet (150 mg total) by mouth at bedtime. 12/02/14   Abner Greenspan, MD   Meds Ordered and Administered this Visit   Medications  methylPREDNISolone acetate (DEPO-MEDROL) injection 80 mg (80 mg Intramuscular Given 01/01/16 1611)    BP 131/70 mmHg  Pulse 59  Temp(Src) 98.3 F (36.8 C) (Oral)  Resp 18  SpO2 98% No data found.   Physical Exam  Constitutional: Deborah Gardner is oriented to person, place, and time. Deborah Gardner appears well-developed and well-nourished.  Cardiovascular: Normal rate.   Pulmonary/Chest: Effort normal.  Neurological: Deborah Gardner is alert and oriented to person, place, and time.  Skin: Rash (rare papulovesicular lesions on the left hand. This is more prominent on her forehead and behind ears.) noted.    ED Course  Procedures (including critical care time)  Labs Review Labs Reviewed - No data to display  Imaging Review No results found.   MDM   1. Contact dermatitis    Likely poison ivy or poison oak. Medrol injection  given here. Prescription for prednisone taper to finish at home. Discussed symptomatic treatment as well. Follow up as needed.    Melony Overly, MD 01/01/16 1622

## 2016-01-24 ENCOUNTER — Other Ambulatory Visit: Payer: Self-pay | Admitting: Family Medicine

## 2016-02-25 ENCOUNTER — Ambulatory Visit (INDEPENDENT_AMBULATORY_CARE_PROVIDER_SITE_OTHER): Payer: Medicare Other | Admitting: Family Medicine

## 2016-02-25 ENCOUNTER — Encounter: Payer: Self-pay | Admitting: Family Medicine

## 2016-02-25 VITALS — BP 122/78 | HR 64 | Temp 97.9°F | Wt 152.2 lb

## 2016-02-25 DIAGNOSIS — R350 Frequency of micturition: Secondary | ICD-10-CM | POA: Diagnosis not present

## 2016-02-25 DIAGNOSIS — R300 Dysuria: Secondary | ICD-10-CM | POA: Diagnosis not present

## 2016-02-25 LAB — POCT URINALYSIS DIPSTICK
BILIRUBIN UA: NEGATIVE
GLUCOSE UA: NEGATIVE
Ketones, UA: NEGATIVE
Nitrite, UA: NEGATIVE
Protein, UA: NEGATIVE
RBC UA: NEGATIVE
SPEC GRAV UA: 1.015
UROBILINOGEN UA: NEGATIVE
pH, UA: 6

## 2016-02-25 MED ORDER — SULFAMETHOXAZOLE-TRIMETHOPRIM 800-160 MG PO TABS: 1.0000 | ORAL_TABLET | Freq: Two times a day (BID) | ORAL | 0 refills | Status: DC

## 2016-02-25 NOTE — Progress Notes (Signed)
Subjective:  Deborah Gardner is a 70 y.o. female who complains of possible urinary tract infection.  She has had symptoms for 5 days.  Symptoms include urinary frequency and dysuria. Patient denies fever, back pain, and vomiting.  Last UTI was unknown. Using over the counter AZO for current symptoms.   States she went to the urologist in past 2 years and had a procedure done for urinary frequency. She did not follow up with them.   Patient does not have a history of recurrent UTI. Patient does not have a history of pyelonephritis.  No other aggravating or relieving factors.  No other c/o.  Past Medical History:  Diagnosis Date  . Allergic rhinitis   . Arthritis   . Cataract    bil  . Colon polyps   . Esophagitis   . GERD (gastroesophageal reflux disease)   . HH (hiatus hernia)   . HTN (hypertension)   . Hyperlipidemia   . Increased pressure in the eye   . OA (osteoarthritis)    hands  . Osteoporosis   . Rectal pain     ROS as in subjective  Reviewed allergies, medications, past medical, surgical, and social history.    Objective: Vitals:   02/25/16 1339  BP: 122/78  Pulse: 64  Temp: 97.9 F (36.6 C)    General appearance: alert, no distress, WD/WN, female Abdomen: +bs, soft, mild suprapubic tenderness without rebound or guarding or referred pain otherwise non tender, non distended, no masses, no hepatomegaly, no splenomegaly, no bruits Back: no CVA tenderness GU: deferred     Laboratory:  Urine dipstick: trace of leuk, spec grav 1.015.      Assessment: Dysuria - Plan: POCT urinalysis dipstick, Urine culture  Urinary frequency - Plan: POCT urinalysis dipstick, Urine culture   Plan: Discussed symptoms, diagnosis, possible complications, and usual course of illness. Discussed that her urine dipstick does not show a definite urinary tract infection however based on her symptoms I will treat her with an antibiotic. Patient is adamant that she feels the same as she  has with previous UTIs.  Begin medication antibiotic per orders below.  Advised increased water intake, can use OTC Tylenol for pain.    Advised that if her symptoms are not improving with the medication or if she worsens then she should let us know.    Urine culture sent.    Recommend that if she is not back to baseline after completing the antibiotic then she should let us know and consider follow-up with urology as discussed.

## 2016-02-28 ENCOUNTER — Telehealth: Payer: Self-pay | Admitting: Family Medicine

## 2016-02-28 LAB — URINE CULTURE

## 2016-02-28 NOTE — Telephone Encounter (Signed)
I recommend that she follow up with her urologist as we discussed at her visit. Mainly because if the symptoms were related to an infection then we should see more improvement than we have with the antibiotic. Let me know if she needs a referral but I think she is established already.

## 2016-02-28 NOTE — Telephone Encounter (Signed)
Frequency is some better still going about 6 times per day and 2 - 3 times per night.  The urgency is still there when she needs to go is still and some times does not make it to the bathroom.  She has completely finished the Cipro. No pain, no burning, no fever, no nausea.  What do you want her to do now?  What do you think is going on?

## 2016-02-29 NOTE — Telephone Encounter (Signed)
Pt was notified.  

## 2016-02-29 NOTE — Telephone Encounter (Signed)
Left message for pt to call me back 

## 2016-03-13 ENCOUNTER — Other Ambulatory Visit: Payer: Self-pay | Admitting: Family Medicine

## 2016-03-15 ENCOUNTER — Other Ambulatory Visit: Payer: Self-pay | Admitting: Family Medicine

## 2016-03-16 MED ORDER — RANITIDINE HCL 150 MG PO TABS: 150.0000 mg | ORAL_TABLET | Freq: Every day | ORAL | 0 refills | Status: DC

## 2016-03-16 NOTE — Addendum Note (Signed)
Addended by: Helene Shoe on: 03/16/2016 10:36 AM   Modules accepted: Orders

## 2016-03-16 NOTE — Telephone Encounter (Signed)
[facility] [address] had system failure yesterday and did not receive a lot of refills and request to resend ranitidine to [facility]. Done.

## 2016-04-20 ENCOUNTER — Telehealth: Payer: Self-pay | Admitting: Family Medicine

## 2016-04-20 DIAGNOSIS — Z0000 Encounter for general adult medical examination without abnormal findings: Secondary | ICD-10-CM

## 2016-04-20 NOTE — Telephone Encounter (Signed)
-----   Message from Marchia Bond sent at 04/17/2016  2:31 PM EDT ----- Regarding: Cpx labs Thurs 10/12, need orders. Thanks :-) Please order  future cpx labs for pt's upcoming lab appt. Thanks Aniceto Boss

## 2016-04-21 ENCOUNTER — Other Ambulatory Visit: Payer: Self-pay | Admitting: Family Medicine

## 2016-04-24 ENCOUNTER — Other Ambulatory Visit: Payer: Self-pay | Admitting: Family Medicine

## 2016-04-24 ENCOUNTER — Telehealth: Payer: Self-pay

## 2016-04-24 DIAGNOSIS — Z1159 Encounter for screening for other viral diseases: Secondary | ICD-10-CM

## 2016-04-24 NOTE — Telephone Encounter (Signed)
Contacted patient to discuss Hep C screening. Patient is fine with this lab being completed. Placing order for PCP's approval.

## 2016-04-24 NOTE — Telephone Encounter (Signed)
Looks like it was already ordered -thanks

## 2016-04-25 ENCOUNTER — Other Ambulatory Visit (INDEPENDENT_AMBULATORY_CARE_PROVIDER_SITE_OTHER): Payer: Medicare Other

## 2016-04-25 ENCOUNTER — Ambulatory Visit (INDEPENDENT_AMBULATORY_CARE_PROVIDER_SITE_OTHER): Payer: Medicare Other

## 2016-04-25 VITALS — BP 126/80 | HR 62 | Temp 98.1°F | Ht 60.5 in | Wt 150.2 lb

## 2016-04-25 DIAGNOSIS — Z0000 Encounter for general adult medical examination without abnormal findings: Secondary | ICD-10-CM | POA: Diagnosis not present

## 2016-04-25 DIAGNOSIS — Z1159 Encounter for screening for other viral diseases: Secondary | ICD-10-CM

## 2016-04-25 LAB — CBC WITH DIFFERENTIAL/PLATELET
BASOS ABS: 0 10*3/uL (ref 0.0–0.1)
Basophils Relative: 0.5 % (ref 0.0–3.0)
EOS ABS: 0.3 10*3/uL (ref 0.0–0.7)
Eosinophils Relative: 3 % (ref 0.0–5.0)
HCT: 37 % (ref 36.0–46.0)
Hemoglobin: 12.2 g/dL (ref 12.0–15.0)
LYMPHS ABS: 2.1 10*3/uL (ref 0.7–4.0)
Lymphocytes Relative: 23.9 % (ref 12.0–46.0)
MCHC: 33.1 g/dL (ref 30.0–36.0)
MCV: 90.3 fl (ref 78.0–100.0)
MONO ABS: 0.8 10*3/uL (ref 0.1–1.0)
MONOS PCT: 9.3 % (ref 3.0–12.0)
NEUTROS ABS: 5.6 10*3/uL (ref 1.4–7.7)
NEUTROS PCT: 63.3 % (ref 43.0–77.0)
PLATELETS: 260 10*3/uL (ref 150.0–400.0)
RBC: 4.09 Mil/uL (ref 3.87–5.11)
RDW: 13.2 % (ref 11.5–15.5)
WBC: 8.9 10*3/uL (ref 4.0–10.5)

## 2016-04-25 LAB — COMPREHENSIVE METABOLIC PANEL
ALT: 13 U/L (ref 0–35)
AST: 15 U/L (ref 0–37)
Albumin: 4.6 g/dL (ref 3.5–5.2)
Alkaline Phosphatase: 74 U/L (ref 39–117)
BILIRUBIN TOTAL: 0.6 mg/dL (ref 0.2–1.2)
BUN: 17 mg/dL (ref 6–23)
CO2: 30 meq/L (ref 19–32)
Calcium: 10 mg/dL (ref 8.4–10.5)
Chloride: 105 mEq/L (ref 96–112)
Creatinine, Ser: 0.74 mg/dL (ref 0.40–1.20)
GFR: 81.81 mL/min (ref >60.00)
GLUCOSE: 94 mg/dL (ref 70–99)
Potassium: 4.3 mEq/L (ref 3.5–5.1)
SODIUM: 142 meq/L (ref 135–145)
Total Protein: 7.7 g/dL (ref 6.0–8.3)

## 2016-04-25 LAB — LIPID PANEL
CHOL/HDL RATIO: 2
Cholesterol: 164 mg/dL (ref 0–200)
HDL: 70.5 mg/dL (ref >39.00)
LDL Cholesterol: 75 mg/dL (ref 0–99)
NONHDL: 93.61
Triglycerides: 91 mg/dL (ref 0.0–149.0)
VLDL: 18.2 mg/dL (ref 0.0–40.0)

## 2016-04-25 LAB — TSH: TSH: 0.33 u[IU]/mL — ABNORMAL LOW (ref 0.35–4.50)

## 2016-04-25 NOTE — Progress Notes (Signed)
Pre visit review using our clinic review tool, if applicable. No additional management support is needed unless otherwise documented below in the visit note. 

## 2016-04-25 NOTE — Progress Notes (Signed)
Subjective:   Deborah Gardner is a 70 y.o. female who presents for Medicare Annual (Subsequent) preventive examination.  Review of Systems:  N/A Cardiac Risk Factors include: advanced age (>55men, >54 women);dyslipidemia     Objective:     Vitals: BP 126/80 (BP Location: Left Arm, Patient Position: Sitting, Cuff Size: Normal)   Pulse 62   Temp 98.1 F (36.7 C) (Oral)   Ht 5' 0.5" (1.537 m) Comment: no shoes  Wt 150 lb 4 oz (68.2 kg)   SpO2 99%   BMI 28.86 kg/m   Body mass index is 28.86 kg/m.   Tobacco History  Smoking Status  . Never Smoker  Smokeless Tobacco  . Never Used     Counseling given: No   Past Medical History:  Diagnosis Date  . Allergic rhinitis   . Arthritis   . Cataract    bil  . Colon polyps   . Esophagitis   . GERD (gastroesophageal reflux disease)   . HH (hiatus hernia)   . HTN (hypertension)   . Hyperlipidemia   . Increased pressure in the eye   . OA (osteoarthritis)    hands  . Osteoporosis   . Rectal pain    Past Surgical History:  Procedure Laterality Date  . ABDOMINAL HYSTERECTOMY     still has ovaries  . APPENDECTOMY    . BREAST CYST ASPIRATION  1/03  . CATARACT EXTRACTION  9/07  . ESOPHAGOGASTRODUODENOSCOPY  11/08  . HEMORRHOID SURGERY    . rectal sphincterotomy    . Retinal laser surgery     Family History  Problem Relation Age of Onset  . Colon cancer Father   . Pneumonia Father   . Thyroid disease Mother   . Depression Mother   . Hypertension Mother   . Osteoporosis Mother   . Liver disease Brother     from alcohol  . Breast cancer Paternal Aunt   . Alcohol abuse Brother     terminal   . Alcohol abuse Brother   . Thyroid disease      half sister  . Colon cancer      Aunt  . Breast cancer Maternal Aunt   . Breast cancer Paternal Aunt    History  Sexual Activity  . Sexual activity: Yes  . Partners: Male  . Birth control/ protection: Surgical    Outpatient Encounter Prescriptions as of 04/25/2016    Medication Sig  . acetaminophen (TYLENOL) 500 MG tablet Take 1,000 mg by mouth every 6 (six) hours as needed for mild pain.   Marland Kitchen ALPRAZolam (XANAX) 0.5 MG tablet Take 1 tablet (0.5 mg total) by mouth daily as needed.  Marland Kitchen amLODipine-benazepril (LOTREL) 5-10 MG capsule TAKE ONE CAPSULE ONCE DAILY  . aspirin 81 MG tablet Take 81 mg by mouth daily.  Marland Kitchen atorvastatin (LIPITOR) 20 MG tablet TAKE 1 TABLET BY MOUTH EVERY DAY  . Calcium Carb-Cholecalciferol (CALCIUM 600 + D PO) Take 1 tablet by mouth daily.  . Cholecalciferol (VITAMIN D) 1000 UNITS capsule Take 1,000 Units by mouth daily.   . cyanocobalamin 1000 MCG tablet Take 100 mcg by mouth daily.  . fish oil-omega-3 fatty acids 1000 MG capsule Take 1 g by mouth daily.   . fluticasone (FLONASE) 50 MCG/ACT nasal spray PLACE 2 SPRAYS INTO THE NOSE DAILY.  Marland Kitchen ibuprofen (ADVIL,MOTRIN) 200 MG tablet Take 200 mg by mouth every 6 (six) hours as needed for pain. Reported on 07/08/2015  . Multiple Vitamin (MULTIVITAMIN PO) Take by  mouth daily.    . NON FORMULARY Arthaffect (one scoop daily with apple juice)   . omeprazole (PRILOSEC) 20 MG capsule TAKE ONE CAPSULE BY MOUTH TWICE A DAY BEFORE A MEAL  . ranitidine (ZANTAC) 150 MG tablet Take 1 tablet (150 mg total) by mouth at bedtime.  . timolol (TIMOPTIC) 0.5 % ophthalmic solution Place 1 drop into both eyes 2 (two) times daily.   . [DISCONTINUED] sulfamethoxazole-trimethoprim (BACTRIM DS,SEPTRA DS) 800-160 MG tablet Take 1 tablet by mouth 2 (two) times daily.   No facility-administered encounter medications on file as of 04/25/2016.     Activities of Daily Living In your present state of health, do you have any difficulty performing the following activities: 04/25/2016  Hearing? N  Vision? N  Difficulty concentrating or making decisions? N  Walking or climbing stairs? N  Dressing or bathing? N  Doing errands, shopping? N  Preparing Food and eating ? N  Using the Toilet? N  In the past six months, have  you accidently leaked urine? Y  Do you have problems with loss of bowel control? N  Managing your Medications? N  Managing your Finances? N  Housekeeping or managing your Housekeeping? N  Some recent data might be hidden    Patient Care Team: Abner Greenspan, MD as PCP - General    Assessment:     Hearing Screening   125Hz  250Hz  500Hz  1000Hz  2000Hz  3000Hz  4000Hz  6000Hz  8000Hz   Right ear:   40 40 40  0    Left ear:   40 40 40  40    Vision Screening Comments: Last vision exam in April 2017 with Dr. Barrington Ellison   Exercise Activities and Dietary recommendations Current Exercise Habits: The patient does not participate in regular exercise at present, Exercise limited by: None identified  Goals    . Increase water intake          Starting 04/25/2016, I will attempt to drink at least 8 oz of water with each meal.       Fall Risk Fall Risk  04/25/2016 12/02/2014 11/21/2013 06/18/2012  Falls in the past year? Yes No Yes No  Number falls in past yr: 1 - 1 -  Injury with Fall? No - Yes -  Risk for fall due to : History of fall(s) - - -  Follow up Falls evaluation completed - - -   Depression Screen PHQ 2/9 Scores 04/25/2016 12/02/2014 11/21/2013 06/18/2012  PHQ - 2 Score 0 0 0 0     Cognitive Testing MMSE - Mini Mental State Exam 04/25/2016  Orientation to time 5  Orientation to Place 5  Registration 3  Attention/ Calculation 0  Recall 3  Language- name 2 objects 0  Language- repeat 1  Language- follow 3 step command 3  Language- read & follow direction 0  Write a sentence 0  Copy design 0  Total score 20   PLEASE NOTE: A Mini-Cog screen was completed. Maximum score is 20. A value of 0 denotes this part of Folstein MMSE was not completed or the patient failed this part of the Mini-Cog screening.   Mini-Cog Screening Orientation to Time - Max 5 pts Orientation to Place - Max 5 pts Registration - Max 3 pts Recall - Max 3 pts Language Repeat - Max 1 pts Language Follow 3 Step  Command - Max 3 pts  Immunization History  Administered Date(s) Administered  . Influenza Split 03/11/2011, 03/28/2012  . Influenza Whole 05/13/2010  . Influenza,  High Dose Seasonal PF 05/01/2013, 05/18/2014, 09/10/2015  . Pneumococcal Conjugate-13 12/02/2014  . Pneumococcal Polysaccharide-23 03/14/2011  . Td 08/30/2004  . Zoster 11/24/2009   Screening Tests Health Maintenance  Topic Date Due  . TETANUS/TDAP  04/25/2017 (Originally 08/30/2014)  . Hepatitis C Screening  08/11/2020 (Originally [birth year])  . MAMMOGRAM  08/18/2016  . COLONOSCOPY  04/10/2018  . INFLUENZA VACCINE  Addressed  . DEXA SCAN  Completed  . ZOSTAVAX  Completed  . PNA vac Low Risk Adult  Completed      Plan:     I have personally reviewed and addressed the Medicare Annual Wellness questionnaire and have noted the following in the patient's chart:  A. Medical and social history B. Use of alcohol, tobacco or illicit drugs  C. Current medications and supplements D. Functional ability and status E.  Nutritional status F.  Physical activity G. Advance directives H. List of other physicians I.  Hospitalizations, surgeries, and ER visits in previous 12 months J.  [place] to include hearing, vision, cognitive, depression L. Referrals and appointments - none  In addition, I have reviewed and discussed with patient certain preventive protocols, quality metrics, and best practice recommendations. A written personalized care plan for preventive services as well as general preventive health recommendations were provided to patient.  See attached scanned questionnaire for additional information.   Signed,   Lindell Noe, MHA, BS, LPN Health Coach

## 2016-04-25 NOTE — Progress Notes (Signed)
PCP notes:   Health maintenance:  Hep C screening - completed Flu vaccine - per pt report, vaccine was taken on 04/10/16 Tetanus - postponed/insurance  Abnormal screenings:   Fall risk - hx of fall without injury Hearing - failed  Patient concerns:   Patient has complaint of right knee pain that occurs upon movement. Onset: approx. 3 wks ago. Frequency: intermittent. Pain scale: 8/10. PCP aware: no.  Nurse concerns:  [place] Balance Assessment Tool completed on patient to assess level of risks for falls. Score: 26/28. Patient is low risk per tool parameters. Full assessment tool attached to AWV questionnaire.   Next PCP appt:   05/01/16 @ 0930  I reviewed health advisor's note, was available for consultation, and agree with documentation and plan. Loura Pardon MD

## 2016-04-25 NOTE — Patient Instructions (Signed)
Deborah Gardner , Thank you for taking time to come for your Medicare Wellness Visit. I appreciate your ongoing commitment to your health goals. Please review the following plan we discussed and let me know if I can assist you in the future.   These are the goals we discussed: Goals    . Increase water intake          Starting 04/25/2016, I will attempt to drink at least 8 oz of water with each meal.        This is a list of the screening recommended for you and due dates:  Health Maintenance  Topic Date Due  . Tetanus Vaccine  04/25/2017*  .  Hepatitis C: One time screening is recommended by Center for Disease Control  (CDC) for  adults born from [birth year] through 1967.   08/11/2020*  . Mammogram  08/18/2016  . Colon Cancer Screening  04/10/2018  . Flu Shot  Addressed  . DEXA scan (bone density measurement)  Completed  . Shingles Vaccine  Completed  . Pneumonia vaccines  Completed  *Topic was postponed. The date shown is not the original due date.   Preventive Care for Adults  A healthy lifestyle and preventive care can promote health and wellness. Preventive health guidelines for adults include the following key practices.  . A routine yearly physical is a good way to check with your health care provider about your health and preventive screening. It is a chance to share any concerns and updates on your health and to receive a thorough exam.  . Visit your dentist for a routine exam and preventive care every 6 months. Brush your teeth twice a day and floss once a day. Good oral hygiene prevents tooth decay and gum disease.  . The frequency of eye exams is based on your age, health, family medical history, use  of contact lenses, and other factors. Follow your health care provider's ecommendations for frequency of eye exams.  . Eat a healthy diet. Foods like vegetables, fruits, whole grains, low-fat dairy products, and lean protein foods contain the nutrients you need without too many  calories. Decrease your intake of foods high in solid fats, added sugars, and salt. Eat the right amount of calories for you. Get information about a proper diet from your health care provider, if necessary.  . Regular physical exercise is one of the most important things you can do for your health. Most adults should get at least 150 minutes of moderate-intensity exercise (any activity that increases your heart rate and causes you to sweat) each week. In addition, most adults need muscle-strengthening exercises on 2 or more days a week.  Silver Sneakers may be a benefit available to you. To determine eligibility, you may visit the website: www.silversneakers.com or contact program at 423-785-9824 Mon-Fri between 8AM-8PM.   . Maintain a healthy weight. The body mass index (BMI) is a screening tool to identify possible weight problems. It provides an estimate of body fat based on height and weight. Your health care provider can find your BMI and can help you achieve or maintain a healthy weight.   For adults 20 years and older: ? A BMI below 18.5 is considered underweight. ? A BMI of 18.5 to 24.9 is normal. ? A BMI of 25 to 29.9 is considered overweight. ? A BMI of 30 and above is considered obese.   . Maintain normal blood lipids and cholesterol levels by exercising and minimizing your intake of saturated fat. Eat  a balanced diet with plenty of fruit and vegetables. Blood tests for lipids and cholesterol should begin at age 44 and be repeated every 5 years. If your lipid or cholesterol levels are high, you are over 50, or you are at high risk for heart disease, you may need your cholesterol levels checked more frequently. Ongoing high lipid and cholesterol levels should be treated with medicines if diet and exercise are not working.  . If you smoke, find out from your health care provider how to quit. If you do not use tobacco, please do not start.  . If you choose to drink alcohol, please do  not consume more than 2 drinks per day. One drink is considered to be 12 ounces (355 mL) of beer, 5 ounces (148 mL) of wine, or 1.5 ounces (44 mL) of liquor.  . If you are 61-41 years old, ask your health care provider if you should take aspirin to prevent strokes.  . Use sunscreen. Apply sunscreen liberally and repeatedly throughout the day. You should seek shade when your shadow is shorter than you. Protect yourself by wearing long sleeves, pants, a wide-brimmed hat, and sunglasses year round, whenever you are outdoors.  . Once a month, do a whole body skin exam, using a mirror to look at the skin on your back. Tell your health care provider of new moles, moles that have irregular borders, moles that are larger than a pencil eraser, or moles that have changed in shape or color.

## 2016-04-26 LAB — HEPATITIS C ANTIBODY: HCV Ab: NEGATIVE

## 2016-05-01 ENCOUNTER — Encounter: Payer: Self-pay | Admitting: Family Medicine

## 2016-05-01 ENCOUNTER — Ambulatory Visit (INDEPENDENT_AMBULATORY_CARE_PROVIDER_SITE_OTHER): Payer: Medicare Other | Admitting: Family Medicine

## 2016-05-01 VITALS — BP 124/80 | HR 74 | Temp 98.7°F | Wt 150.8 lb

## 2016-05-01 DIAGNOSIS — Z0000 Encounter for general adult medical examination without abnormal findings: Secondary | ICD-10-CM

## 2016-05-01 DIAGNOSIS — F419 Anxiety disorder, unspecified: Secondary | ICD-10-CM

## 2016-05-01 DIAGNOSIS — E7800 Pure hypercholesterolemia, unspecified: Secondary | ICD-10-CM

## 2016-05-01 DIAGNOSIS — M8580 Other specified disorders of bone density and structure, unspecified site: Secondary | ICD-10-CM

## 2016-05-01 DIAGNOSIS — E0590 Thyrotoxicosis, unspecified without thyrotoxic crisis or storm: Secondary | ICD-10-CM

## 2016-05-01 DIAGNOSIS — I10 Essential (primary) hypertension: Secondary | ICD-10-CM | POA: Diagnosis not present

## 2016-05-01 MED ORDER — ALPRAZOLAM 0.5 MG PO TABS: 0.5000 mg | ORAL_TABLET | Freq: Every day | ORAL | 3 refills | Status: DC | PRN

## 2016-05-01 NOTE — Assessment & Plan Note (Signed)
Reviewed health habits including diet and exercise and skin cancer prevention Reviewed appropriate screening tests for age  Also reviewed health mt list, fam hx and immunization status , as well as social and family history   See HPI Reviewed [facility] visit Reviewed labs Think about getting a tetanus shot at the health dept  Exercise and getting outdoors help stress If you ever want to see a counselor for stress please let me know  I refilled your xanax Labs look good but thyroid test is still off a bit  Take care of yourself

## 2016-05-01 NOTE — Assessment & Plan Note (Signed)
Disc goals for lipids and reasons to control them Rev labs with pt Rev low sat fat diet in detail  Fairly well controlled 

## 2016-05-01 NOTE — Assessment & Plan Note (Signed)
Pt declines f/u dexa at this time  Rev dexa  One fall/no fx Disc need for calcium/ vitamin D/ wt bearing exercise and bone density test every 2 y to monitor Disc safety/ fracture risk in detail

## 2016-05-01 NOTE — Progress Notes (Signed)
Subjective:    Patient ID: Deborah Gardner, female    DOB: February 03, 1946, 70 y.o.   MRN: [id number]  HPI Here for health maintenance exam and to review chronic medical problems    Had her AMW visit -no concerns   Tetanus shot 2006- her ins does not pay for it   Mammogram 2/17-normal  Self breast exam-no lumps or changes   dexa 4/14-stable osteopenia  Had one fall w/o injury (slick shoes) -got rid of the shoes  No fractures  Taking her ca and D Does not want to schedule dexa yet   Wt Readings from Last 3 Encounters:  05/01/16 150 lb 12.8 oz (68.4 kg)  04/25/16 150 lb 4 oz (68.2 kg)  02/25/16 152 lb 3.2 oz (69 kg)      Father had colon cancer  Her colonoscopy 10/14-nl with 5 y recall   bp is stable today  No cp or palpitations or headaches or edema  No side effects to medicines  BP Readings from Last 3 Encounters:  05/01/16 124/80  04/25/16 126/80  02/25/16 122/78      Hx of subclinical hyperthyroidism Has seen Dr Cruzita Lederer Lab Results  Component Value Date   TSH 0.33 (L) 04/25/2016   no symptoms    Hx of hyperlipidemia lipitor and diet Lab Results  Component Value Date   CHOL 164 04/25/2016   CHOL 155 11/25/2014   CHOL 143 11/21/2013   Lab Results  Component Value Date   HDL 70.50 04/25/2016   HDL 56.80 11/25/2014   HDL 64.20 11/21/2013   Lab Results  Component Value Date   LDLCALC 75 04/25/2016   [place] 75 11/25/2014   LDLCALC 58 11/21/2013   Lab Results  Component Value Date   TRIG 91.0 04/25/2016   TRIG 118.0 11/25/2014   TRIG 106.0 11/21/2013   Lab Results  Component Value Date   CHOLHDL 2 04/25/2016   CHOLHDL 3 11/25/2014   CHOLHDL 2 11/21/2013   No results found for: LDLDIRECT Very good profile  Diet is so /so  More active lately but no regular exercise routine   Hep C screen negative    Needs a refill of xanax - takes it for sleep if she really needs it (takes 1/2 of one)  occ melatonin as well -sometimes it helps at 5 mg    Stressor-her sister is in jail -has to pay her bills  When she gets out -will have to stop  She will contact social services    R knee is giving her a fit lately- very stiff after inactivity  Is interested in flexagenics    Has a wart on R hand -bothersome (? Or dry patch)-she plans on going to her dermatologist   Patient Active Problem List   Diagnosis Date Noted  . Routine general medical examination at a health care facility 12/02/2014  . Encounter for Medicare annual wellness exam 11/21/2013  . Anxiety 11/21/2013  . Dysuria 07/25/2013  . Subclinical hyperthyroidism 06/18/2012  . Pruritus ani 03/08/2011  . UNSPECIFIED ESOPHAGITIS 08/12/2010  . OSTEOARTHRITIS, GENERALIZED, HAND 03/21/2010  . 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 ROTATOR CUFF SYNDROME, LEFT 03/21/2010  . ALLERGIC RHINITIS 11/03/2008  . ARTHRALGIA 10/08/2008  . ADENOMATOUS COLONIC POLYP 08/19/2007  . ESOPHAGITIS, REFLUX 08/19/2007  . HIATAL HERNIA 08/19/2007  . Hyperlipidemia 03/05/2007  . G E R D 03/05/2007  . Osteopenia 03/05/2007  . Essential hypertension 02/08/2007  . IBS 02/08/2007  . FIBROCYSTIC BREAST DISEASE 02/08/2007  . OSTEOARTHRITIS  02/08/2007  . [place] DISEASE, LUMBAR 02/08/2007  . STRESS INCONTINENCE 02/08/2007   Past Medical History:  Diagnosis Date  . Allergic rhinitis   . Arthritis   . Cataract    bil  . Colon polyps   . Esophagitis   . GERD (gastroesophageal reflux disease)   . HH (hiatus hernia)   . HTN (hypertension)   . Hyperlipidemia   . Increased pressure in the eye   . OA (osteoarthritis)    hands  . Osteoporosis   . Rectal pain    Past Surgical History:  Procedure Laterality Date  . ABDOMINAL HYSTERECTOMY     still has ovaries  . APPENDECTOMY    . BREAST CYST ASPIRATION  1/03  . CATARACT EXTRACTION  9/07  . ESOPHAGOGASTRODUODENOSCOPY  11/08  . HEMORRHOID SURGERY    . rectal sphincterotomy    . Retinal laser surgery     Social History  Substance Use Topics   . Smoking status: Never Smoker  . Smokeless tobacco: Never Used  . Alcohol use No   Family History  Problem Relation Age of Onset  . Colon cancer Father   . Pneumonia Father   . Thyroid disease Mother   . Depression Mother   . Hypertension Mother   . Osteoporosis Mother   . Liver disease Brother     from alcohol  . Breast cancer Paternal Aunt   . Alcohol abuse Brother     terminal   . Alcohol abuse Brother   . Thyroid disease      half sister  . Colon cancer      Aunt  . Breast cancer Maternal Aunt   . Breast cancer Paternal Aunt    No Known Allergies Current Outpatient Prescriptions on File Prior to Visit  Medication Sig Dispense Refill  . acetaminophen (TYLENOL) 500 MG tablet Take 1,000 mg by mouth every 6 (six) hours as needed for mild pain.     Marland Kitchen amLODipine-benazepril (LOTREL) 5-10 MG capsule TAKE ONE CAPSULE ONCE DAILY 90 capsule 0  . aspirin 81 MG tablet Take 81 mg by mouth daily.    Marland Kitchen atorvastatin (LIPITOR) 20 MG tablet TAKE 1 TABLET BY MOUTH EVERY DAY 90 tablet 1  . Calcium Carb-Cholecalciferol (CALCIUM 600 + D PO) Take 1 tablet by mouth daily.    . Cholecalciferol (VITAMIN D) 1000 UNITS capsule Take 1,000 Units by mouth daily.     . cyanocobalamin 1000 MCG tablet Take 100 mcg by mouth daily.    . fish oil-omega-3 fatty acids 1000 MG capsule Take 1 g by mouth daily.     . fluticasone (FLONASE) 50 MCG/ACT nasal spray PLACE 2 SPRAYS INTO THE NOSE DAILY. 16 g 4  . ibuprofen (ADVIL,MOTRIN) 200 MG tablet Take 200 mg by mouth every 6 (six) hours as needed for pain. Reported on 07/08/2015    . Multiple Vitamin (MULTIVITAMIN PO) Take by mouth daily.      . NON FORMULARY Arthaffect (one scoop daily with apple juice)     . omeprazole (PRILOSEC) 20 MG capsule TAKE ONE CAPSULE BY MOUTH TWICE A DAY BEFORE A MEAL 180 capsule 0  . ranitidine (ZANTAC) 150 MG tablet Take 1 tablet (150 mg total) by mouth at bedtime. 90 tablet 0  . timolol (TIMOPTIC) 0.5 % ophthalmic solution Place 1  drop into both eyes 2 (two) times daily.      No current facility-administered medications on file prior to visit.     Review of Systems Review of  Systems  Constitutional: Negative for fever, appetite change, fatigue and unexpected weight change.  Eyes: Negative for pain and visual disturbance.  Respiratory: Negative for cough and shortness of breath.   Cardiovascular: Negative for cp or palpitations    Gastrointestinal: Negative for nausea, diarrhea and constipation.  Genitourinary: Negative for urgency and frequency.  Skin: Negative for pallor or rash  pos for dry spot on L hand / ? Wart  MSK pos for knee pain  Neurological: Negative for weakness, light-headedness, numbness and headaches.  Hematological: Negative for adenopathy. Does not bruise/bleed easily.  Psychiatric/Behavioral: Negative for dysphoric mood. The patient is nervous/anxious. Pos for stressors         Objective:   Physical Exam  Constitutional: She appears well-developed and well-nourished. No distress.  Well appearing   HENT:  Head: Normocephalic and atraumatic.  Right Ear: External ear normal.  Left Ear: External ear normal.  Mouth/Throat: Oropharynx is clear and moist.  Eyes: Conjunctivae and EOM are normal. Pupils are equal, round, and reactive to light. No scleral icterus.  Neck: Normal range of motion. Neck supple. No JVD present. Carotid bruit is not present. No thyromegaly present.  Cardiovascular: Normal rate, regular rhythm, normal heart sounds and intact distal pulses.  Exam reveals no gallop.   Pulmonary/Chest: Effort normal and breath sounds normal. No respiratory distress. She has no wheezes. She exhibits no tenderness.  Abdominal: Soft. Bowel sounds are normal. She exhibits no distension, no abdominal bruit and no mass. There is no tenderness.  Genitourinary: No breast swelling, tenderness, discharge or bleeding.  Genitourinary Comments: Breast exam: No mass, nodules, thickening, tenderness,  bulging, retraction, inflamation, nipple discharge or skin changes noted.  No axillary or clavicular LA.      Musculoskeletal: Normal range of motion. She exhibits no edema or tenderness.  Limited rom of knees due to pain   Lymphadenopathy:    She has no cervical adenopathy.  Neurological: She is alert. She has normal reflexes. No cranial nerve deficit. She exhibits normal muscle tone. Coordination normal.  Skin: Skin is warm and dry. No rash noted. No erythema. No pallor.  SK on L dorsal hand 4 mm and raised   Solar lentigines diffusely  Psychiatric: She has a normal mood and affect.          Assessment & Plan:   Problem List Items Addressed This Visit      Cardiovascular and Mediastinum   Essential hypertension    bp in fair control at this time  BP Readings from Last 1 Encounters:  05/01/16 124/80   No changes needed Disc lifstyle change with low sodium diet and exercise  Labs reviewed         Endocrine   Subclinical hyperthyroidism    Lab Results  Component Value Date   TSH 0.33 (L) 04/25/2016   She sees Dr Cruzita Lederer for this         Musculoskeletal and Integument   Osteopenia    Pt declines f/u dexa at this time  Rev dexa  One fall/no fx Disc need for calcium/ vitamin D/ wt bearing exercise and bone density test every 2 y to monitor Disc safety/ fracture risk in detail          Other   Anxiety    Worse lately with situational stressors Reviewed stressors/ coping techniques/symptoms/ support sources/ tx options and side effects in detail today  Offered counseling  Refilled xanax  If worse-may consider daily medication instead of prn Enc exercise and outdoor  time       Relevant Medications   ALPRAZolam (XANAX) 0.5 MG tablet   Hyperlipidemia    Disc goals for lipids and reasons to control them Rev labs with pt Rev low sat fat diet in detail Fairly well controlled       Routine general medical examination at a health care facility - Primary     Reviewed health habits including diet and exercise and skin cancer prevention Reviewed appropriate screening tests for age  Also reviewed health mt list, fam hx and immunization status , as well as social and family history   See HPI Reviewed AMW visit Reviewed labs Think about getting a tetanus shot at the health dept  Exercise and getting outdoors help stress If you ever want to see a counselor for stress please let me know  I refilled your xanax Labs look good but thyroid test is still off a bit  Take care of yourself        Other Visit Diagnoses   None.

## 2016-05-01 NOTE — Patient Instructions (Addendum)
Think about getting a tetanus shot at the health dept  Exercise and getting outdoors help stress If you ever want to see a counselor for stress please let me know  I refilled your xanax Labs look good but thyroid test is still off a bit  Take care of yourself

## 2016-05-01 NOTE — Progress Notes (Signed)
Diabetes Home sugar results  DM diet  Exercise  Symptoms A1C last No results found for: HGBA1C  No problems with medications  Renal protection Last eye exam

## 2016-05-01 NOTE — Assessment & Plan Note (Signed)
Lab Results  Component Value Date   TSH 0.33 (L) 04/25/2016   She sees Dr Cruzita Lederer for this

## 2016-05-01 NOTE — Assessment & Plan Note (Signed)
bp in fair control at this time  BP Readings from Last 1 Encounters:  05/01/16 124/80   No changes needed Disc lifstyle change with low sodium diet and exercise  Labs reviewed

## 2016-05-01 NOTE — Assessment & Plan Note (Signed)
Worse lately with situational stressors Reviewed stressors/ coping techniques/symptoms/ support sources/ tx options and side effects in detail today  Offered counseling  Refilled xanax  If worse-may consider daily medication instead of prn Enc exercise and outdoor time

## 2016-05-22 ENCOUNTER — Telehealth: Payer: Self-pay | Admitting: *Deleted

## 2016-05-22 DIAGNOSIS — M25561 Pain in right knee: Principal | ICD-10-CM

## 2016-05-22 DIAGNOSIS — G8929 Other chronic pain: Secondary | ICD-10-CM

## 2016-05-22 DIAGNOSIS — M1711 Unilateral primary osteoarthritis, right knee: Secondary | ICD-10-CM | POA: Insufficient documentation

## 2016-05-22 NOTE — Telephone Encounter (Signed)
The right knee is correct, pt will come in Thursday 05/25/16 for the xray and advise we will make a plan after xray results are back

## 2016-05-22 NOTE — Telephone Encounter (Signed)
Patient left a voicemail stating that she was in for her physical recently and she had discussed with Dr. Glori Bickers right knee pain. Patient stated that she is calling back to see about getting an x-ray or advice about what she should do next?

## 2016-05-22 NOTE — Telephone Encounter (Signed)
I am putting in an order for R knee xr  Let me know if that is not the correct knee Will see how the reading looks  Schedule a time to come in for xray only please

## 2016-05-25 ENCOUNTER — Ambulatory Visit (INDEPENDENT_AMBULATORY_CARE_PROVIDER_SITE_OTHER)
Admission: RE | Admit: 2016-05-25 | Discharge: 2016-05-25 | Disposition: A | Discharge status: Home / Self Care | Payer: Medicare Other | Source: Ambulatory Visit | Attending: Family Medicine | Admitting: Family Medicine

## 2016-05-25 DIAGNOSIS — G8929 Other chronic pain: Secondary | ICD-10-CM

## 2016-05-25 DIAGNOSIS — M25561 Pain in right knee: Secondary | ICD-10-CM | POA: Diagnosis not present

## 2016-05-26 ENCOUNTER — Telehealth: Payer: Self-pay | Admitting: Family Medicine

## 2016-05-26 DIAGNOSIS — M1711 Unilateral primary osteoarthritis, right knee: Secondary | ICD-10-CM

## 2016-05-26 NOTE — Telephone Encounter (Signed)
-----   Message from Tammi Sou, [place] sent at 05/26/2016  1:55 PM EST ----- Pt notified of xray results and Dr. Marliss Coots comments and agrees with referral to Ortho, please put referral in and I advised pt our [facility] will call to schedule appt

## 2016-05-26 NOTE — Telephone Encounter (Signed)
Ref done will route to [facility]

## 2016-05-29 NOTE — Telephone Encounter (Signed)
Appt made with Dr Lynann Bologna and patient aware.

## 2016-06-16 ENCOUNTER — Other Ambulatory Visit: Payer: Self-pay

## 2016-06-16 MED ORDER — RANITIDINE HCL 150 MG PO TABS: 150.0000 mg | ORAL_TABLET | Freq: Every day | ORAL | 3 refills | Status: DC

## 2016-06-16 NOTE — Telephone Encounter (Signed)
[facility] left v/m requesting refill ranitidine.pt has omeprazole and ranitidine on med list.Please advise.

## 2016-06-16 NOTE — Telephone Encounter (Signed)
Px written for call in   

## 2016-06-23 ENCOUNTER — Other Ambulatory Visit: Payer: Self-pay | Admitting: Family Medicine

## 2016-07-18 ENCOUNTER — Other Ambulatory Visit: Payer: Self-pay | Admitting: Family Medicine

## 2016-07-22 ENCOUNTER — Other Ambulatory Visit: Payer: Self-pay | Admitting: Family Medicine

## 2016-08-28 ENCOUNTER — Other Ambulatory Visit: Payer: Self-pay | Admitting: Family Medicine

## 2016-08-28 DIAGNOSIS — Z1231 Encounter for screening mammogram for malignant neoplasm of breast: Secondary | ICD-10-CM

## 2016-09-11 ENCOUNTER — Ambulatory Visit
Admission: RE | Admit: 2016-09-11 | Discharge: 2016-09-11 | Disposition: A | Discharge status: Home / Self Care | Payer: Medicare Other | Source: Ambulatory Visit | Attending: Family Medicine | Admitting: Family Medicine

## 2016-09-11 DIAGNOSIS — Z1231 Encounter for screening mammogram for malignant neoplasm of breast: Secondary | ICD-10-CM

## 2016-09-12 ENCOUNTER — Encounter: Payer: Self-pay | Admitting: *Deleted

## 2016-10-09 ENCOUNTER — Other Ambulatory Visit: Payer: Self-pay | Admitting: Family Medicine

## 2016-10-31 ENCOUNTER — Telehealth: Payer: Self-pay

## 2016-10-31 NOTE — Telephone Encounter (Signed)
Pt was going to give blood this past weekend but pt could not donate blood due to low hgb. Pt was given a sheet with foods that will help to boost hgb but pt said she already eats these foods. Pt could not remember the value of the hgb over the weekend. 04/25/16 hgb was12.2. Pt also was advised that lipitor could cause memory loss and pt wants to know if her taking atorvastatin could cause her a slight memory loss. Pt does not want to schedule appt unless could come in for medicare wellness appt. Pt said that when she scheduled the [place] that she was told her last visit was 05/01/16; I am not sure if the 05/01/16 is [place] or annual visit. Pt would like to schedule her [place] if could get sooner than Oct. Pt request cb.

## 2016-10-31 NOTE — Telephone Encounter (Signed)
Her cbc was fine in the fall -so that is concerning  Her amw and annual exam are due in late oct -she can schedule those for then  In the meantime we should see her for anemia - if she declines a visit we can do a lab draw for cbc and ferritin at least  Thanks

## 2016-10-31 NOTE — Telephone Encounter (Signed)
Pt already scheduled AWV and CPE in Oct. Pt notified of Dr. Marliss Coots comments and f/u appt scheduled to discuss hbg issue

## 2016-11-03 ENCOUNTER — Ambulatory Visit (INDEPENDENT_AMBULATORY_CARE_PROVIDER_SITE_OTHER): Payer: Medicare Other | Admitting: Family Medicine

## 2016-11-03 ENCOUNTER — Encounter: Payer: Self-pay | Admitting: Family Medicine

## 2016-11-03 VITALS — BP 120/64 | HR 61 | Temp 98.5°F | Ht 60.05 in | Wt 151.0 lb

## 2016-11-03 DIAGNOSIS — R413 Other amnesia: Secondary | ICD-10-CM | POA: Diagnosis not present

## 2016-11-03 DIAGNOSIS — D649 Anemia, unspecified: Secondary | ICD-10-CM | POA: Insufficient documentation

## 2016-11-03 LAB — FERRITIN: Ferritin: 25.5 ng/mL (ref 10.0–291.0)

## 2016-11-03 LAB — CBC WITH DIFFERENTIAL/PLATELET
BASOS PCT: 0.8 % (ref 0.0–3.0)
Basophils Absolute: 0.1 10*3/uL (ref 0.0–0.1)
EOS ABS: 0.3 10*3/uL (ref 0.0–0.7)
Eosinophils Relative: 3.2 % (ref 0.0–5.0)
HCT: 37.1 % (ref 36.0–46.0)
Hemoglobin: 12.5 g/dL (ref 12.0–15.0)
Lymphocytes Relative: 27.4 % (ref 12.0–46.0)
Lymphs Abs: 2.4 10*3/uL (ref 0.7–4.0)
MCHC: 33.7 g/dL (ref 30.0–36.0)
MCV: 89.1 fl (ref 78.0–100.0)
MONO ABS: 0.7 10*3/uL (ref 0.1–1.0)
Monocytes Relative: 7.9 % (ref 3.0–12.0)
NEUTROS ABS: 5.3 10*3/uL (ref 1.4–7.7)
Neutrophils Relative %: 60.7 % (ref 43.0–77.0)
PLATELETS: 229 10*3/uL (ref 150.0–400.0)
RBC: 4.16 Mil/uL (ref 3.87–5.11)
RDW: 12.6 % (ref 11.5–15.5)
WBC: 8.8 10*3/uL (ref 4.0–10.5)

## 2016-11-03 LAB — VITAMIN B12: Vitamin B-12: 1379 pg/mL — ABNORMAL HIGH (ref 211–911)

## 2016-11-03 LAB — IBC PANEL
IRON: 75 ug/dL (ref 42–145)
Saturation Ratios: 18.9 % — ABNORMAL LOW (ref 20.0–50.0)
Transferrin: 284 mg/dL (ref 212.0–360.0)

## 2016-11-03 NOTE — Patient Instructions (Signed)
If you think the lipitor is causing memory problems- stop it for a month or two and see if things improve and let me know The most important thing to keep memory is socialization   Labs today for anemia

## 2016-11-03 NOTE — Progress Notes (Signed)
Pre visit review using our clinic review tool, if applicable. No additional management support is needed unless otherwise documented below in the visit note. 

## 2016-11-03 NOTE — Progress Notes (Signed)
Subjective:    Patient ID: Deborah Gardner, female    DOB: November 01, 1945, 71 y.o.   MRN: [id number]  HPI Here for check of low Hb   She had a bug bite on R middle finger from working in the garden  Swollen R middle finger - better now    Wt Readings from Last 3 Encounters:  11/03/16 151 lb (68.5 kg)  05/01/16 150 lb 12.8 oz (68.4 kg)  04/25/16 150 lb 4 oz (68.2 kg)   BP Readings from Last 3 Encounters:  11/03/16 120/64  05/01/16 124/80  04/25/16 126/80   Pulse Readings from Last 3 Encounters:  11/03/16 61  05/01/16 74  04/25/16 62     She went to donate blood and it was low  Pricked her finger twice and told it was too low to donate (? How low it was)  No hx of anemia  No big changes in diet  She thinks she is eating a lot of foods with iron  (chicken livers)   Lab Results  Component Value Date   WBC 8.9 04/25/2016   HGB 12.2 04/25/2016   HCT 37.0 04/25/2016   MCV 90.3 04/25/2016   PLT 260.0 04/25/2016    Colon cancer screening : 10/14- 5 y recall for hx of polyps  No blood in stool and no black stool  No abd pain or easy bruising or bleeding   Takes omeprazole daily   Family hx-no anemia or blood disorders   Recent surgeries -none    Has noticed small dec in short term memory since starting statin  Patient Active Problem List   Diagnosis Date Noted  . Anemia, unspecified 11/03/2016  . Memory change 11/03/2016  . Osteoarthritis of right knee 05/22/2016  . Routine general medical examination at a health care facility 12/02/2014  . Encounter for Medicare annual wellness exam 11/21/2013  . Anxiety 11/21/2013  . Dysuria 07/25/2013  . Subclinical hyperthyroidism 06/18/2012  . Pruritus ani 03/08/2011  . UNSPECIFIED ESOPHAGITIS 08/12/2010  . OSTEOARTHRITIS, GENERALIZED, HAND 03/21/2010  . 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 ROTATOR CUFF SYNDROME, LEFT 03/21/2010  . ALLERGIC RHINITIS 11/03/2008  . ARTHRALGIA 10/08/2008  . ADENOMATOUS COLONIC POLYP  08/19/2007  . ESOPHAGITIS, REFLUX 08/19/2007  . HIATAL HERNIA 08/19/2007  . Hyperlipidemia 03/05/2007  . G E R D 03/05/2007  . Osteopenia 03/05/2007  . Essential hypertension 02/08/2007  . IBS 02/08/2007  . FIBROCYSTIC BREAST DISEASE 02/08/2007  . OSTEOARTHRITIS 02/08/2007  . [place] DISEASE, LUMBAR 02/08/2007  . STRESS INCONTINENCE 02/08/2007   Past Medical History:  Diagnosis Date  . Allergic rhinitis   . Arthritis   . Cataract    bil  . Colon polyps   . Esophagitis   . GERD (gastroesophageal reflux disease)   . HH (hiatus hernia)   . HTN (hypertension)   . Hyperlipidemia   . Increased pressure in the eye   . OA (osteoarthritis)    hands  . Osteoporosis   . Rectal pain    Past Surgical History:  Procedure Laterality Date  . ABDOMINAL HYSTERECTOMY     still has ovaries  . APPENDECTOMY    . BREAST CYST ASPIRATION  1/03  . CATARACT EXTRACTION  9/07  . ESOPHAGOGASTRODUODENOSCOPY  11/08  . HEMORRHOID SURGERY    . rectal sphincterotomy    . Retinal laser surgery     Social History  Substance Use Topics  . Smoking status: Never Smoker  . Smokeless tobacco: Never Used  . Alcohol use  No   Family History  Problem Relation Age of Onset  . Colon cancer Father   . Pneumonia Father   . Thyroid disease Mother   . Depression Mother   . Hypertension Mother   . Osteoporosis Mother   . Liver disease Brother     from alcohol  . Breast cancer Paternal Aunt   . Alcohol abuse Brother     terminal   . Alcohol abuse Brother   . Thyroid disease      half sister  . Colon cancer      Aunt  . Breast cancer Maternal Aunt   . Breast cancer Paternal Aunt    No Known Allergies Current Outpatient Prescriptions on File Prior to Visit  Medication Sig Dispense Refill  . acetaminophen (TYLENOL) 500 MG tablet Take 1,000 mg by mouth every 6 (six) hours as needed for mild pain.     Marland Kitchen ALPRAZolam (XANAX) 0.5 MG tablet Take 1 tablet (0.5 mg total) by mouth daily as needed. 30 tablet 3    . amLODipine-benazepril (LOTREL) 5-10 MG capsule TAKE ONE CAPSULE ONCE DAILY 90 capsule 1  . aspirin 81 MG tablet Take 81 mg by mouth daily.    Marland Kitchen atorvastatin (LIPITOR) 20 MG tablet TAKE 1 TABLET BY MOUTH EVERY DAY 90 tablet 1  . Calcium Carb-Cholecalciferol (CALCIUM 600 + D PO) Take 1 tablet by mouth daily.    . Cholecalciferol (VITAMIN D) 1000 UNITS capsule Take 1,000 Units by mouth daily.     . cyanocobalamin 1000 MCG tablet Take 100 mcg by mouth daily.    . fish oil-omega-3 fatty acids 1000 MG capsule Take 1 g by mouth daily.     . fluticasone (FLONASE) 50 MCG/ACT nasal spray PLACE 2 SPRAYS INTO THE NOSE DAILY. 16 g 5  . ibuprofen (ADVIL,MOTRIN) 200 MG tablet Take 200 mg by mouth every 6 (six) hours as needed for pain. Reported on 07/08/2015    . Multiple Vitamin (MULTIVITAMIN PO) Take by mouth daily.      . NON FORMULARY Arthaffect (one scoop daily with apple juice)     . omeprazole (PRILOSEC) 20 MG capsule TAKE ONE CAPSULE BY MOUTH TWICE A DAY BEFORE A MEAL (Patient taking differently: TAKE ONE CAPSULE BY MOUTH once a DAY BEFORE A MEAL) 180 capsule 1  . ranitidine (ZANTAC) 150 MG tablet Take 1 tablet (150 mg total) by mouth at bedtime. 90 tablet 3  . timolol (TIMOPTIC) 0.5 % ophthalmic solution Place 1 drop into both eyes 2 (two) times daily.      No current facility-administered medications on file prior to visit.     Review of Systems Review of Systems  Constitutional: Negative for fever, appetite change, fatigue and unexpected weight change.  Eyes: Negative for pain and visual disturbance.  Respiratory: Negative for cough and shortness of breath.   Cardiovascular: Negative for cp or palpitations    Gastrointestinal: Negative for nausea, diarrhea and constipation.  Genitourinary: Negative for urgency and frequency.  Skin: Negative for pallor or rash   Neurological: Negative for weakness, light-headedness, numbness and headaches. pos for less short term memory since starting statin   Hematological: Negative for adenopathy. Does not bruise/bleed easily.  Psychiatric/Behavioral: Negative for dysphoric mood. The patient is not nervous/anxious.         Objective:   Physical Exam  Constitutional: She appears well-developed and well-nourished. No distress.  Well appearing elderly female   HENT:  Head: Normocephalic and atraumatic.  Mouth/Throat: Oropharynx is clear and  moist.  Eyes: Conjunctivae and EOM are normal. Pupils are equal, round, and reactive to light. No scleral icterus.  Neck: Normal range of motion. Neck supple. No JVD present. Carotid bruit is not present. No thyromegaly present.  Cardiovascular: Normal rate, regular rhythm, normal heart sounds and intact distal pulses.  Exam reveals no gallop.   Pulmonary/Chest: Effort normal and breath sounds normal. No respiratory distress. She has no wheezes. She has no rales.  No crackles  Abdominal: Soft. Bowel sounds are normal. She exhibits no distension, no abdominal bruit and no mass. There is no hepatosplenomegaly. There is no tenderness. There is no guarding.  Musculoskeletal: She exhibits no edema.  Lymphadenopathy:    She has no cervical adenopathy.  Neurological: She is alert. She has normal reflexes.  Skin: Skin is warm and dry. No rash noted. No pallor.  Small insect bite with 2-3 mm of erythema on R middle finger distal  Psychiatric: She has a normal mood and affect.  Nl affect Seemingly nl cognition          Assessment & Plan:   Problem List Items Addressed This Visit      Other   Anemia, unspecified    [facility] No hx of problems  Colonoscopy 2014 No change in GI status  Cbc,ferritin,TIBC and B12 today        Relevant Orders   CBC with Differential/Platelet (Completed)   Ferritin (Completed)   IBC Panel (Completed)   Vitamin B12 (Completed)   Memory change    Pt has noticed some slowing of short term memory since starting statin-wonders if it is the cause inst  her to try 2 -4 wk off of it and see if that improves it Will update [place]

## 2016-11-05 NOTE — Assessment & Plan Note (Signed)
Pt has noticed some slowing of short term memory since starting statin-wonders if it is the cause inst her to try 2 -4 wk off of it and see if that improves it Will update [place]

## 2016-11-05 NOTE — Assessment & Plan Note (Signed)
[facility] No hx of problems  Colonoscopy 2014 No change in GI status  Cbc,ferritin,TIBC and B12 today

## 2016-11-07 ENCOUNTER — Encounter: Payer: Self-pay | Admitting: *Deleted

## 2016-12-18 ENCOUNTER — Ambulatory Visit (INDEPENDENT_AMBULATORY_CARE_PROVIDER_SITE_OTHER): Payer: Medicare Other | Admitting: Family Medicine

## 2016-12-18 ENCOUNTER — Encounter: Payer: Self-pay | Admitting: Family Medicine

## 2016-12-18 VITALS — BP 138/76 | HR 59 | Temp 98.3°F | Ht 60.5 in | Wt 149.0 lb

## 2016-12-18 DIAGNOSIS — J029 Acute pharyngitis, unspecified: Secondary | ICD-10-CM

## 2016-12-18 LAB — POCT RAPID STREP A (OFFICE): RAPID STREP A SCREEN: NEGATIVE

## 2016-12-18 MED ORDER — BENZONATATE 200 MG PO CAPS: 200.0000 mg | ORAL_CAPSULE | Freq: Two times a day (BID) | ORAL | 0 refills | Status: DC | PRN

## 2016-12-18 MED ORDER — AZITHROMYCIN 250 MG PO TABS: ORAL_TABLET | ORAL | 0 refills | Status: DC

## 2016-12-18 NOTE — Patient Instructions (Signed)
Great to see you.  Drink a lot of fluids, tessalon as needed a cough.  Zpack as directed if symptoms are not improving over next few days as expected.

## 2016-12-18 NOTE — Progress Notes (Signed)
SUBJECTIVE:  Deborah Gardner is a 71 y.o. female who complains of coryza, congestion, sore throat and headache for 5 days. She denies a history of anorexia and chest pain and denies a history of asthma. Patient denies smoke cigarettes.  Taking Zyrtec daily but she is not sure it is helping much.  Yolanda Bonine has similar symptoms per pt- was told he may have strep.    Current Outpatient Prescriptions on File Prior to Visit  Medication Sig Dispense Refill  . acetaminophen (TYLENOL) 500 MG tablet Take 1,000 mg by mouth every 6 (six) hours as needed for mild pain.     Marland Kitchen ALPRAZolam (XANAX) 0.5 MG tablet Take 1 tablet (0.5 mg total) by mouth daily as needed. 30 tablet 3  . amLODipine-benazepril (LOTREL) 5-10 MG capsule TAKE ONE CAPSULE ONCE DAILY 90 capsule 1  . aspirin 81 MG tablet Take 81 mg by mouth daily.    Marland Kitchen atorvastatin (LIPITOR) 20 MG tablet TAKE 1 TABLET BY MOUTH EVERY DAY 90 tablet 1  . Calcium Carb-Cholecalciferol (CALCIUM 600 + D PO) Take 1 tablet by mouth daily.    . Cholecalciferol (VITAMIN D) 1000 UNITS capsule Take 1,000 Units by mouth daily.     . cyanocobalamin 1000 MCG tablet Take 100 mcg by mouth daily.    . fish oil-omega-3 fatty acids 1000 MG capsule Take 1 g by mouth daily.     . fluticasone (FLONASE) 50 MCG/ACT nasal spray PLACE 2 SPRAYS INTO THE NOSE DAILY. 16 g 5  . ibuprofen (ADVIL,MOTRIN) 200 MG tablet Take 200 mg by mouth every 6 (six) hours as needed for pain. Reported on 07/08/2015    . Multiple Vitamin (MULTIVITAMIN PO) Take by mouth daily.      . NON FORMULARY Arthaffect (one scoop daily with apple juice)     . omeprazole (PRILOSEC) 20 MG capsule TAKE ONE CAPSULE BY MOUTH TWICE A DAY BEFORE A MEAL (Patient taking differently: TAKE ONE CAPSULE BY MOUTH once a DAY BEFORE A MEAL) 180 capsule 1  . ranitidine (ZANTAC) 150 MG tablet Take 1 tablet (150 mg total) by mouth at bedtime. 90 tablet 3  . timolol (TIMOPTIC) 0.5 % ophthalmic solution Place 1 drop into both eyes 2  (two) times daily.      No current facility-administered medications on file prior to visit.     No Known Allergies  Past Medical History:  Diagnosis Date  . Allergic rhinitis   . Arthritis   . Cataract    bil  . Colon polyps   . Esophagitis   . GERD (gastroesophageal reflux disease)   . HH (hiatus hernia)   . HTN (hypertension)   . Hyperlipidemia   . Increased pressure in the eye   . OA (osteoarthritis)    hands  . Osteoporosis   . Rectal pain     Past Surgical History:  Procedure Laterality Date  . ABDOMINAL HYSTERECTOMY     still has ovaries  . APPENDECTOMY    . BREAST CYST ASPIRATION  1/03  . CATARACT EXTRACTION  9/07  . ESOPHAGOGASTRODUODENOSCOPY  11/08  . HEMORRHOID SURGERY    . rectal sphincterotomy    . Retinal laser surgery      Family History  Problem Relation Age of Onset  . Colon cancer Father   . Pneumonia Father   . Thyroid disease Mother   . Depression Mother   . Hypertension Mother   . Osteoporosis Mother   . Liver disease Brother  from alcohol  . Breast cancer Paternal Aunt   . Alcohol abuse Brother        terminal   . Alcohol abuse Brother   . Thyroid disease Unknown        half sister  . Colon cancer Unknown        Aunt  . Breast cancer Maternal Aunt   . Breast cancer Paternal Aunt     Social History   Social History  . Marital status: Married    Spouse name: N/A  . Number of children: 2  . Years of education: N/A   Occupational History  . Retired Retired   Social History Main Topics  . Smoking status: Never Smoker  . Smokeless tobacco: Never Used  . Alcohol use No  . Drug use: No  . Sexual activity: Yes    Partners: Male    Birth control/ protection: Surgical   Other Topics Concern  . Not on file   Social History Narrative   Married      2 children      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 with husband,  and cares for her brother--who had alcohol and stroke      Cared for elderly mother for years (she  passed in 2009)   The PMH, PSH, Social History, Family History, Medications, and allergies have been reviewed in [facility], and have been updated if relevant.  OBJECTIVE: BP 138/76   Pulse (!) 59   Temp 98.3 F (36.8 C)   Ht 5' 0.5" (1.537 m)   Wt 149 lb (67.6 kg)   SpO2 98%   BMI 28.62 kg/m   She appears well, vital signs are as noted. Ears normal.  Throat and pharynx normal.  Neck supple. No adenopathy in the neck. Nose is congested. Sinuses non tender. The chest is clear, without wheezes or rales.  ASSESSMENT:  viral upper respiratory illness  PLAN: Symptomatic therapy suggested: push fluids, rest and return office visit prn if symptoms persist or worsen. eRx sent for tessalon perles. Lack of antibiotic effectiveness discussed with her but I did send in eRx for zpack for her to fill if symptoms persist. Call or return to clinic prn if these symptoms worsen or fail to improve as anticipated.

## 2017-01-19 ENCOUNTER — Other Ambulatory Visit: Payer: Self-pay | Admitting: Family Medicine

## 2017-02-05 ENCOUNTER — Telehealth: Payer: Self-pay | Admitting: *Deleted

## 2017-02-05 MED ORDER — ROSUVASTATIN CALCIUM 10 MG PO TABS: 10.0000 mg | ORAL_TABLET | Freq: Every day | ORAL | 11 refills | Status: DC

## 2017-02-05 NOTE — Telephone Encounter (Signed)
If she wants to try another med in the statin family then we can send in crestor  Let me know if she wants to do that or we can disc at her next visit Please list atorvastatin in her med intol list for memory issues

## 2017-02-05 NOTE — Telephone Encounter (Signed)
Patient left a voicemail stating that she stopped her Lipitor several months ago hoping that it might help her with her memory. Patient stated that she feels that it has helped. Patient wants to know if there is something else that you want her to do about her cholesterol?

## 2017-02-05 NOTE — Telephone Encounter (Signed)
Patient advised.

## 2017-02-05 NOTE — Telephone Encounter (Signed)
Patient states she will try the crestor and would like it to be sent to [address]

## 2017-02-05 NOTE — Telephone Encounter (Signed)
I sent it  Let me know if she has any side effects or problems

## 2017-04-23 ENCOUNTER — Telehealth: Payer: Self-pay | Admitting: Family Medicine

## 2017-04-23 DIAGNOSIS — E0590 Thyrotoxicosis, unspecified without thyrotoxic crisis or storm: Secondary | ICD-10-CM

## 2017-04-23 DIAGNOSIS — I10 Essential (primary) hypertension: Secondary | ICD-10-CM

## 2017-04-23 DIAGNOSIS — E7800 Pure hypercholesterolemia, unspecified: Secondary | ICD-10-CM

## 2017-04-23 DIAGNOSIS — D649 Anemia, unspecified: Secondary | ICD-10-CM

## 2017-04-23 NOTE — Telephone Encounter (Signed)
-----   Message from Eustace Pen, LPN sent at [id number]  3:51 PM EDT ----- Regarding: Labs 10/18 Lab orders needed. Thank you.  Insurance:  [facility] Medicare

## 2017-04-26 ENCOUNTER — Ambulatory Visit (INDEPENDENT_AMBULATORY_CARE_PROVIDER_SITE_OTHER): Payer: Medicare Other

## 2017-04-26 ENCOUNTER — Other Ambulatory Visit: Payer: Self-pay | Admitting: Family Medicine

## 2017-04-26 VITALS — BP 124/72 | HR 58 | Temp 98.1°F | Ht 60.0 in | Wt 149.2 lb

## 2017-04-26 DIAGNOSIS — Z0000 Encounter for general adult medical examination without abnormal findings: Secondary | ICD-10-CM

## 2017-04-26 DIAGNOSIS — I10 Essential (primary) hypertension: Secondary | ICD-10-CM

## 2017-04-26 DIAGNOSIS — E0590 Thyrotoxicosis, unspecified without thyrotoxic crisis or storm: Secondary | ICD-10-CM

## 2017-04-26 DIAGNOSIS — D649 Anemia, unspecified: Secondary | ICD-10-CM

## 2017-04-26 DIAGNOSIS — E7800 Pure hypercholesterolemia, unspecified: Secondary | ICD-10-CM | POA: Diagnosis not present

## 2017-04-26 LAB — LIPID PANEL
Cholesterol: 142 mg/dL (ref 0–200)
HDL: 64 mg/dL (ref >39.00)
LDL Cholesterol: 54 mg/dL (ref 0–99)
NonHDL: 77.7
Total CHOL/HDL Ratio: 2
Triglycerides: 121 mg/dL (ref 0.0–149.0)
VLDL: 24.2 mg/dL (ref 0.0–40.0)

## 2017-04-26 LAB — CBC WITH DIFFERENTIAL/PLATELET
BASOS ABS: 0.1 10*3/uL (ref 0.0–0.1)
BASOS PCT: 1 % (ref 0.0–3.0)
EOS ABS: 0.4 10*3/uL (ref 0.0–0.7)
EOS PCT: 5.2 % — AB (ref 0.0–5.0)
HCT: 34.9 % — ABNORMAL LOW (ref 36.0–46.0)
Hemoglobin: 11.6 g/dL — ABNORMAL LOW (ref 12.0–15.0)
Lymphocytes Relative: 29.9 % (ref 12.0–46.0)
Lymphs Abs: 2.4 10*3/uL (ref 0.7–4.0)
MCHC: 33.3 g/dL (ref 30.0–36.0)
MCV: 90.6 fl (ref 78.0–100.0)
MONO ABS: 0.7 10*3/uL (ref 0.1–1.0)
MONOS PCT: 8.6 % (ref 3.0–12.0)
NEUTROS PCT: 55.3 % (ref 43.0–77.0)
Neutro Abs: 4.4 10*3/uL (ref 1.4–7.7)
Platelets: 262 10*3/uL (ref 150.0–400.0)
RBC: 3.85 Mil/uL — AB (ref 3.87–5.11)
RDW: 13.3 % (ref 11.5–15.5)
WBC: 8 10*3/uL (ref 4.0–10.5)

## 2017-04-26 LAB — COMPREHENSIVE METABOLIC PANEL
ALK PHOS: 60 U/L (ref 39–117)
ALT: 12 U/L (ref 0–35)
AST: 14 U/L (ref 0–37)
Albumin: 4.4 g/dL (ref 3.5–5.2)
BILIRUBIN TOTAL: 0.5 mg/dL (ref 0.2–1.2)
BUN: 13 mg/dL (ref 6–23)
CO2: 30 mEq/L (ref 19–32)
Calcium: 9.9 mg/dL (ref 8.4–10.5)
Chloride: 104 mEq/L (ref 96–112)
Creatinine, Ser: 0.7 mg/dL (ref 0.40–1.20)
GFR: 86.99 mL/min (ref >60.00)
GLUCOSE: 97 mg/dL (ref 70–99)
POTASSIUM: 4.3 meq/L (ref 3.5–5.1)
SODIUM: 141 meq/L (ref 135–145)
TOTAL PROTEIN: 7.2 g/dL (ref 6.0–8.3)

## 2017-04-26 LAB — T4, FREE: Free T4: 0.76 ng/dL (ref 0.60–1.60)

## 2017-04-26 LAB — TSH: TSH: 0.44 u[IU]/mL (ref 0.35–4.50)

## 2017-04-26 NOTE — Progress Notes (Signed)
PCP notes:   Health maintenance:  Tetanus - postponed/insurance  Abnormal screenings:   Hearing - failed  Hearing Screening   125Hz  250Hz  500Hz  1000Hz  2000Hz  3000Hz  4000Hz  6000Hz  8000Hz   Right ear:   40 40 40  0    Left ear:   40 40 40  40     Patient concerns:   Patient wants to discuss increased feelings of agitation with PCP.   Nurse concerns:  None  Next PCP appt:   05/02/17 @ 1030

## 2017-04-26 NOTE — Progress Notes (Signed)
Pre visit review using our clinic review tool, if applicable. No additional management support is needed unless otherwise documented below in the visit note. 

## 2017-04-26 NOTE — Progress Notes (Signed)
Subjective:   Deborah Gardner is a 71 y.o. female who presents for Medicare Annual (Subsequent) preventive examination.  Review of Systems:  N/A Cardiac Risk Factors include: advanced age (>7men, >44 women);dyslipidemia     Objective:     Vitals: BP 124/72 (BP Location: Right Arm, Patient Position: Sitting, Cuff Size: Normal)   Pulse (!) 58   Temp 98.1 F (36.7 C) (Oral)   Ht 5' (1.524 m) Comment: no shoes  Wt 149 lb 4 oz (67.7 kg)   SpO2 98%   BMI 29.15 kg/m   Body mass index is 29.15 kg/m.   Tobacco History  Smoking Status  . Never Smoker  Smokeless Tobacco  . Never Used     Counseling given: No   Past Medical History:  Diagnosis Date  . Allergic rhinitis   . Arthritis   . Cataract    bil  . Colon polyps   . Esophagitis   . GERD (gastroesophageal reflux disease)   . HH (hiatus hernia)   . HTN (hypertension)   . Hyperlipidemia   . Increased pressure in the eye   . OA (osteoarthritis)    hands  . Osteoporosis   . Rectal pain    Past Surgical History:  Procedure Laterality Date  . ABDOMINAL HYSTERECTOMY     still has ovaries  . APPENDECTOMY    . BREAST CYST ASPIRATION  1/03  . CATARACT EXTRACTION  9/07  . ESOPHAGOGASTRODUODENOSCOPY  11/08  . HEMORRHOID SURGERY    . rectal sphincterotomy    . Retinal laser surgery     Family History  Problem Relation Age of Onset  . Colon cancer Father   . Pneumonia Father   . Thyroid disease Mother   . Depression Mother   . Hypertension Mother   . Osteoporosis Mother   . Liver disease Brother        from alcohol  . Breast cancer Paternal Aunt   . Alcohol abuse Brother        terminal   . Alcohol abuse Brother   . Thyroid disease Unknown        half sister  . Colon cancer Unknown        Aunt  . Breast cancer Maternal Aunt   . Breast cancer Paternal Aunt    History  Sexual Activity  . Sexual activity: Yes  . Partners: Male  . Birth control/ protection: Surgical    Outpatient Encounter  Prescriptions as of 04/26/2017  Medication Sig  . acetaminophen (TYLENOL) 500 MG tablet Take 1,000 mg by mouth every 6 (six) hours as needed for mild pain.   Marland Kitchen ALPRAZolam (XANAX) 0.5 MG tablet Take 1 tablet (0.5 mg total) by mouth daily as needed.  Marland Kitchen amLODipine-benazepril (LOTREL) 5-10 MG capsule TAKE 1 CAPSULE BY MOUTH EVERY DAY  . aspirin 81 MG tablet Take 81 mg by mouth daily.  . Calcium Carb-Cholecalciferol (CALCIUM 600 + D PO) Take 1 tablet by mouth daily.  . Cholecalciferol (VITAMIN D) 1000 UNITS capsule Take 1,000 Units by mouth daily.   . cyanocobalamin 1000 MCG tablet Take 100 mcg by mouth daily.  . fish oil-omega-3 fatty acids 1000 MG capsule Take 1 g by mouth daily.   . fluticasone (FLONASE) 50 MCG/ACT nasal spray PLACE 2 SPRAYS INTO THE NOSE DAILY.  . Multiple Vitamin (MULTIVITAMIN PO) Take by mouth daily.    . NON FORMULARY Arthaffect (one scoop daily with apple juice)   . omeprazole (PRILOSEC) 20 MG capsule TAKE  ONE CAPSULE BY MOUTH TWICE A DAY BEFORE A MEAL (Patient taking differently: TAKE ONE CAPSULE BY MOUTH once a DAY BEFORE A MEAL)  . ranitidine (ZANTAC) 150 MG tablet Take 1 tablet (150 mg total) by mouth at bedtime.  . rosuvastatin (CRESTOR) 10 MG tablet Take 1 tablet (10 mg total) by mouth daily.  . timolol (TIMOPTIC) 0.5 % ophthalmic solution Place 1 drop into both eyes 2 (two) times daily.   Marland Kitchen ibuprofen (ADVIL,MOTRIN) 200 MG tablet Take 200 mg by mouth every 6 (six) hours as needed for pain. Reported on 07/08/2015  . [DISCONTINUED] azithromycin (ZITHROMAX) 250 MG tablet 2 tabs by mouth on day 1 followed by 1 tab by mouth daily days 2- 5  . [DISCONTINUED] benzonatate (TESSALON) 200 MG capsule Take 1 capsule (200 mg total) by mouth 2 (two) times daily as needed for cough.   No facility-administered encounter medications on file as of 04/26/2017.     Activities of Daily Living In your present state of health, do you have any difficulty performing the following  activities: 04/26/2017  Hearing? N  Vision? N  Difficulty concentrating or making decisions? N  Walking or climbing stairs? Y  Comment knee pain when climbing stairs  Dressing or bathing? N  Doing errands, shopping? N  Preparing Food and eating ? N  Using the Toilet? N  In the past six months, have you accidently leaked urine? Y  Do you have problems with loss of bowel control? N  Managing your Medications? N  Managing your Finances? N  Housekeeping or managing your Housekeeping? N  Some recent data might be hidden    Patient Care Team: Tower, Wynelle [name], MD as PCP - General    Assessment:     Hearing Screening   125Hz  250Hz  500Hz  1000Hz  2000Hz  3000Hz  4000Hz  6000Hz  8000Hz   Right ear:   40 40 40  0    Left ear:   40 40 40  40    Vision Screening Comments: Last vision exam in 2018 @ [facility]/Dr. Talbert Forest   Exercise Activities and Dietary recommendations Current Exercise Habits: The patient does not participate in regular exercise at present, Exercise limited by: orthopedic condition(s)  Goals    . Increase water intake          Starting 04/26/2017, I will attempt to drink at least 8 oz of water with each meal.       Fall Risk Fall Risk  04/26/2017 04/25/2016 12/02/2014 11/21/2013 06/18/2012  Falls in the past year? No Yes No Yes No  Comment - accidental fall due to slippery shoes outside.  no injury.  - - -  Number falls in past yr: - 1 - 1 -  Injury with Fall? - No - Yes -  Risk for fall due to : - History of fall(s) - - -  Follow up - Falls evaluation completed - - -   Depression Screen PHQ 2/9 Scores 04/26/2017 04/25/2016 12/02/2014 11/21/2013  PHQ - 2 Score 0 0 0 0  PHQ- 9 Score 0 - - -     Cognitive Function MMSE - Mini Mental State Exam 04/26/2017 04/25/2016  Orientation to time 5 5  Orientation to Place 5 5  Registration 3 3  Attention/ Calculation 0 0  Recall 3 3  Language- name 2 objects 0 0  Language- repeat 1 1  Language-  follow 3 step command 3 3  Language- read & follow direction 0 0  Write  a sentence 0 0  Copy design 0 0  Total score 20 20     PLEASE NOTE: A Mini-Cog screen was completed. Maximum score is 20. A value of 0 denotes this part of Folstein MMSE was not completed or the patient failed this part of the Mini-Cog screening.   Mini-Cog Screening Orientation to Time - Max 5 pts Orientation to Place - Max 5 pts Registration - Max 3 pts Recall - Max 3 pts Language Repeat - Max 1 pts Language Follow 3 Step Command - Max 3 pts     Immunization History  Administered Date(s) Administered  . Influenza Split 03/11/2011, 03/28/2012, 04/10/2016  . Influenza Whole 05/13/2010  . Influenza, High Dose Seasonal PF 05/01/2013, 05/18/2014, 09/10/2015, 04/10/2016, 04/12/2017  . Pneumococcal Conjugate-13 12/02/2014  . Pneumococcal Polysaccharide-23 03/14/2011  . Td 08/30/2004  . Zoster 11/24/2009   Screening Tests Health Maintenance  Topic Date Due  . TETANUS/TDAP  08/29/2024 (Originally 08/30/2014)  . MAMMOGRAM  09/11/2017  . COLONOSCOPY  04/10/2018  . INFLUENZA VACCINE  Completed  . DEXA SCAN  Completed  . Hepatitis C Screening  Completed  . PNA vac Low Risk Adult  Completed      Plan:     I have personally reviewed and addressed the Medicare Annual Wellness questionnaire and have noted the following in the patient's chart:  A. Medical and social history B. Use of alcohol, tobacco or illicit drugs  C. Current medications and supplements D. Functional ability and status E.  Nutritional status F.  Physical activity G. Advance directives H. List of other physicians I.  Hospitalizations, surgeries, and ER visits in previous 12 months J.  [place] to include hearing, vision, cognitive, depression L. Referrals and appointments - none  In addition, I have reviewed and discussed with patient certain preventive protocols, quality metrics, and best practice recommendations. A written  personalized care plan for preventive services as well as general preventive health recommendations were provided to patient.  See attached scanned questionnaire for additional information.   Signed,   Lindell Noe, MHA, BS, LPN Health Coach

## 2017-04-26 NOTE — Progress Notes (Signed)
I reviewed health advisor's note, was available for consultation, and agree with documentation and plan.  

## 2017-04-26 NOTE — Patient Instructions (Signed)
PLEASE CONTACT YOUR INSURANCE REGARDING COVERAGE FOR TETANUS VACCINE.   Deborah Gardner , Thank you for taking time to come for your Medicare Wellness Visit. I appreciate your ongoing commitment to your health goals. Please review the following plan we discussed and let me know if I can assist you in the future.   These are the goals we discussed: Goals    . Increase water intake          Starting 04/26/2017, I will attempt to drink at least 8 oz of water with each meal.        This is a list of the screening recommended for you and due dates:  Health Maintenance  Topic Date Due  . Tetanus Vaccine  08/29/2024*  . Mammogram  09/11/2017  . Colon Cancer Screening  04/10/2018  . Flu Shot  Completed  . DEXA scan (bone density measurement)  Completed  .  Hepatitis C: One time screening is recommended by Center for Disease Control  (CDC) for  adults born from [birth year] through 1967.   Completed  . Pneumonia vaccines  Completed  *Topic was postponed. The date shown is not the original due date.   Preventive Care for Adults  A healthy lifestyle and preventive care can promote health and wellness. Preventive health guidelines for adults include the following key practices.  . A routine yearly physical is a good way to check with your health care provider about your health and preventive screening. It is a chance to share any concerns and updates on your health and to receive a thorough exam.  . Visit your dentist for a routine exam and preventive care every 6 months. Brush your teeth twice a day and floss once a day. Good oral hygiene prevents tooth decay and gum disease.  . The frequency of eye exams is based on your age, health, family medical history, use  of contact lenses, and other factors. Follow your health care provider's ecommendations for frequency of eye exams.  . Eat a healthy diet. Foods like vegetables, fruits, whole grains, low-fat dairy products, and lean protein foods contain the  nutrients you need without too many calories. Decrease your intake of foods high in solid fats, added sugars, and salt. Eat the right amount of calories for you. Get information about a proper diet from your health care provider, if necessary.  . Regular physical exercise is one of the most important things you can do for your health. Most adults should get at least 150 minutes of moderate-intensity exercise (any activity that increases your heart rate and causes you to sweat) each week. In addition, most adults need muscle-strengthening exercises on 2 or more days a week.  Silver Sneakers may be a benefit available to you. To determine eligibility, you may visit the website: www.silversneakers.com or contact program at 307-212-0314 Mon-Fri between 8AM-8PM.   . Maintain a healthy weight. The body mass index (BMI) is a screening tool to identify possible weight problems. It provides an estimate of body fat based on height and weight. Your health care provider can find your BMI and can help you achieve or maintain a healthy weight.   For adults 20 years and older: ? A BMI below 18.5 is considered underweight. ? A BMI of 18.5 to 24.9 is normal. ? A BMI of 25 to 29.9 is considered overweight. ? A BMI of 30 and above is considered obese.   . Maintain normal blood lipids and cholesterol levels by exercising and minimizing your  intake of saturated fat. Eat a balanced diet with plenty of fruit and vegetables. Blood tests for lipids and cholesterol should begin at age 86 and be repeated every 5 years. If your lipid or cholesterol levels are high, you are over 50, or you are at high risk for heart disease, you may need your cholesterol levels checked more frequently. Ongoing high lipid and cholesterol levels should be treated with medicines if diet and exercise are not working.  . If you smoke, find out from your health care provider how to quit. If you do not use tobacco, please do not start.  . If you  choose to drink alcohol, please do not consume more than 2 drinks per day. One drink is considered to be 12 ounces (355 mL) of beer, 5 ounces (148 mL) of wine, or 1.5 ounces (44 mL) of liquor.  . If you are 34-84 years old, ask your health care provider if you should take aspirin to prevent strokes.  . Use sunscreen. Apply sunscreen liberally and repeatedly throughout the day. You should seek shade when your shadow is shorter than you. Protect yourself by wearing long sleeves, pants, a wide-brimmed hat, and sunglasses year round, whenever you are outdoors.  . Once a month, do a whole body skin exam, using a mirror to look at the skin on your back. Tell your health care provider of new moles, moles that have irregular borders, moles that are larger than a pencil eraser, or moles that have changed in shape or color.

## 2017-05-02 ENCOUNTER — Encounter: Payer: Self-pay | Admitting: Family Medicine

## 2017-05-02 ENCOUNTER — Ambulatory Visit (INDEPENDENT_AMBULATORY_CARE_PROVIDER_SITE_OTHER): Payer: Medicare Other | Admitting: Family Medicine

## 2017-05-02 VITALS — BP 126/64 | HR 65 | Temp 98.8°F | Ht 60.0 in | Wt 151.5 lb

## 2017-05-02 DIAGNOSIS — F419 Anxiety disorder, unspecified: Secondary | ICD-10-CM

## 2017-05-02 DIAGNOSIS — K2100 Gastro-esophageal reflux disease with esophagitis, without bleeding: Secondary | ICD-10-CM

## 2017-05-02 DIAGNOSIS — E0590 Thyrotoxicosis, unspecified without thyrotoxic crisis or storm: Secondary | ICD-10-CM

## 2017-05-02 DIAGNOSIS — D649 Anemia, unspecified: Secondary | ICD-10-CM | POA: Diagnosis not present

## 2017-05-02 DIAGNOSIS — E7800 Pure hypercholesterolemia, unspecified: Secondary | ICD-10-CM

## 2017-05-02 DIAGNOSIS — M8580 Other specified disorders of bone density and structure, unspecified site: Secondary | ICD-10-CM | POA: Diagnosis not present

## 2017-05-02 DIAGNOSIS — I10 Essential (primary) hypertension: Secondary | ICD-10-CM

## 2017-05-02 DIAGNOSIS — Z0000 Encounter for general adult medical examination without abnormal findings: Secondary | ICD-10-CM | POA: Diagnosis not present

## 2017-05-02 MED ORDER — ROSUVASTATIN CALCIUM 10 MG PO TABS: 10.0000 mg | ORAL_TABLET | Freq: Every day | ORAL | 3 refills | Status: DC

## 2017-05-02 MED ORDER — AMLODIPINE BESY-BENAZEPRIL HCL 5-10 MG PO CAPS: ORAL_CAPSULE | ORAL | 3 refills | Status: DC

## 2017-05-02 MED ORDER — RANITIDINE HCL 150 MG PO TABS: 150.0000 mg | ORAL_TABLET | Freq: Every day | ORAL | 3 refills | Status: DC

## 2017-05-02 MED ORDER — ESCITALOPRAM OXALATE 10 MG PO TABS: 10.0000 mg | ORAL_TABLET | Freq: Every day | ORAL | 11 refills | Status: DC

## 2017-05-02 MED ORDER — FLUTICASONE PROPIONATE 50 MCG/ACT NA SUSP: NASAL | 11 refills | Status: DC

## 2017-05-02 MED ORDER — OMEPRAZOLE 20 MG PO CPDR: DELAYED_RELEASE_CAPSULE | ORAL | 3 refills | Status: DC

## 2017-05-02 NOTE — Progress Notes (Signed)
Subjective:    Patient ID: Deborah Gardner, female    DOB: 06/11/1946, 71 y.o.   MRN: [id number]  HPI Here for health maintenance exam and to review chronic medical problems     Wt Readings from Last 3 Encounters:  05/02/17 151 lb 8 oz (68.7 kg)  04/26/17 149 lb 4 oz (67.7 kg)  12/18/16 149 lb (67.6 kg)  weight is stable No extra exercise - is on the move  29.59 kg/m   Had amw 10/18 Missed 4000 Hz in R ear only  Not bothering you   Postponed tetanus shot due to ins   Mammogram 3/18 nl Self breast exam - no lumps   Colonoscopy 10/14- 5 y recall   dexa 4/14 osteopenia  Does not want to do a dexa  No falls or fractures  On ca and D   zostavax 5/11  bp is stable today  No cp or palpitations or headaches or edema  No side effects to medicines  BP Readings from Last 3 Encounters:  05/02/17 126/64  04/26/17 124/72  12/18/16 138/76     Lab Results  Component Value Date   CREATININE 0.70 04/26/2017   BUN 13 04/26/2017   NA 141 04/26/2017   K 4.3 04/26/2017   CL 104 04/26/2017   CO2 30 04/26/2017   Lab Results  Component Value Date   ALT 12 04/26/2017   AST 14 04/26/2017   ALKPHOS 60 04/26/2017   BILITOT 0.5 04/26/2017     Subclinical hyperthyroid Lab Results  Component Value Date   TSH 0.44 04/26/2017   has not had to see Dr Cruzita Lederer -doing well   Hist of mild anemia  Lab Results  Component Value Date   WBC 8.0 04/26/2017   HGB 11.6 (L) 04/26/2017   HCT 34.9 (L) 04/26/2017   MCV 90.6 04/26/2017   PLT 262.0 04/26/2017   donated blood recently 3 weeks ago -that is the reason   Lab Results  Component Value Date   FERRITIN 25.5 11/03/2016   Lab Results  Component Value Date   VITAMINB12 1,379 (H) 11/03/2016   Hyperlipidemia Lab Results  Component Value Date   CHOL 142 04/26/2017   CHOL 164 04/25/2016   CHOL 155 11/25/2014   Lab Results  Component Value Date   HDL 64.00 04/26/2017   HDL 70.50 04/25/2016   HDL 56.80 11/25/2014   Lab  Results  Component Value Date   LDLCALC 54 04/26/2017   [place] 75 04/25/2016   [place] 75 11/25/2014   Lab Results  Component Value Date   TRIG 121.0 04/26/2017   TRIG 91.0 04/25/2016   TRIG 118.0 11/25/2014   Lab Results  Component Value Date   CHOLHDL 2 04/26/2017   CHOLHDL 2 04/25/2016   CHOLHDL 3 11/25/2014   No results found for: LDLDIRECT crestor and diet (less side eff with lipitor) Wants to dose at night - that is fine  Diet is good most of the time occ chips  More sweets lately  Glucose is 97  Mood/anxiety  Taking xanax less  Lots of stressors/family  More irritable lately - causes arguments with sister and husband (he has some memory loss) Would like to try ssri    For gerd - prilosec in am and zantac at bedtime  She would like to eventually go to zantac bid and come off of prilosec/will let me know  Well controlled currently   Patient Active Problem List   Diagnosis Date Noted  .  Anemia, unspecified 11/03/2016  . Memory change 11/03/2016  . Osteoarthritis of right knee 05/22/2016  . Routine general medical examination at a health care facility 12/02/2014  . Encounter for Medicare annual wellness exam 11/21/2013  . Anxiety 11/21/2013  . Subclinical hyperthyroidism 06/18/2012  . Pruritus ani 03/08/2011  . UNSPECIFIED ESOPHAGITIS 08/12/2010  . OSTEOARTHRITIS, GENERALIZED, HAND 03/21/2010  . 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 ROTATOR CUFF SYNDROME, LEFT 03/21/2010  . ALLERGIC RHINITIS 11/03/2008  . ARTHRALGIA 10/08/2008  . ADENOMATOUS COLONIC POLYP 08/19/2007  . ESOPHAGITIS, REFLUX 08/19/2007  . HIATAL HERNIA 08/19/2007  . Hyperlipidemia 03/05/2007  . G E R D 03/05/2007  . Osteopenia 03/05/2007  . Essential hypertension 02/08/2007  . IBS 02/08/2007  . FIBROCYSTIC BREAST DISEASE 02/08/2007  . OSTEOARTHRITIS 02/08/2007  . [place] DISEASE, LUMBAR 02/08/2007  . STRESS INCONTINENCE 02/08/2007   Past Medical History:  Diagnosis Date  . Allergic  rhinitis   . Arthritis   . Cataract    bil  . Colon polyps   . Esophagitis   . GERD (gastroesophageal reflux disease)   . HH (hiatus hernia)   . HTN (hypertension)   . Hyperlipidemia   . Increased pressure in the eye   . OA (osteoarthritis)    hands  . Osteoporosis   . Rectal pain    Past Surgical History:  Procedure Laterality Date  . ABDOMINAL HYSTERECTOMY     still has ovaries  . APPENDECTOMY    . BREAST CYST ASPIRATION  1/03  . CATARACT EXTRACTION  9/07  . ESOPHAGOGASTRODUODENOSCOPY  11/08  . HEMORRHOID SURGERY    . rectal sphincterotomy    . Retinal laser surgery     Social History  Substance Use Topics  . Smoking status: Never Smoker  . Smokeless tobacco: Never Used  . Alcohol use No   Family History  Problem Relation Age of Onset  . Colon cancer Father   . Pneumonia Father   . Thyroid disease Mother   . Depression Mother   . Hypertension Mother   . Osteoporosis Mother   . Liver disease Brother        from alcohol  . Breast cancer Paternal Aunt   . Alcohol abuse Brother        terminal   . Alcohol abuse Brother   . Thyroid disease Unknown        half sister  . Colon cancer Unknown        Aunt  . Breast cancer Maternal Aunt   . Breast cancer Paternal Aunt    Allergies  Allergen Reactions  . Atorvastatin Other (See Comments)    Memory loss   Current Outpatient Prescriptions on File Prior to Visit  Medication Sig Dispense Refill  . ALPRAZolam (XANAX) 0.5 MG tablet Take 1 tablet (0.5 mg total) by mouth daily as needed. 30 tablet 3  . aspirin 81 MG tablet Take 81 mg by mouth daily.    . Calcium Carb-Cholecalciferol (CALCIUM 600 + D PO) Take 1 tablet by mouth daily.    . Cholecalciferol (VITAMIN D) 1000 UNITS capsule Take 1,000 Units by mouth daily.     . fish oil-omega-3 fatty acids 1000 MG capsule Take 1 g by mouth daily.     . Multiple Vitamin (MULTIVITAMIN PO) Take by mouth daily.      . NON FORMULARY Arthaffect (one scoop daily with apple  juice)     . timolol (TIMOPTIC) 0.5 % ophthalmic solution Place 1 drop into both eyes 2 (two) times  daily.      No current facility-administered medications on file prior to visit.     Review of Systems  Constitutional: Negative for activity change, appetite change, fatigue, fever and unexpected weight change.  HENT: Negative for congestion, ear pain, rhinorrhea, sinus pressure and sore throat.   Eyes: Negative for pain, redness and visual disturbance.  Respiratory: Negative for cough, shortness of breath and wheezing.   Cardiovascular: Negative for chest pain and palpitations.  Gastrointestinal: Negative for abdominal pain, blood in stool, constipation and diarrhea.  Endocrine: Negative for polydipsia and polyuria.  Genitourinary: Negative for dysuria, frequency and urgency.  Musculoskeletal: Negative for arthralgias, back pain and myalgias.  Skin: Negative for pallor and rash.  Allergic/Immunologic: Negative for environmental allergies.  Neurological: Negative for dizziness, syncope and headaches.  Hematological: Negative for adenopathy. Does not bruise/bleed easily.  Psychiatric/Behavioral: Positive for sleep disturbance. Negative for decreased concentration and dysphoric mood. The patient is nervous/anxious.        Objective:   Physical Exam  Constitutional: She appears well-developed and well-nourished. No distress.  overwt and well app  HENT:  Head: Normocephalic and atraumatic.  Right Ear: External ear normal.  Left Ear: External ear normal.  Mouth/Throat: Oropharynx is clear and moist.  Eyes: Pupils are equal, round, and reactive to light. Conjunctivae and EOM are normal. No scleral icterus.  Neck: Normal range of motion. Neck supple. No JVD present. Carotid bruit is not present. No thyromegaly present.  Cardiovascular: Normal rate, regular rhythm, normal heart sounds and intact distal pulses.  Exam reveals no gallop.   Pulmonary/Chest: Effort normal and breath sounds  normal. No respiratory distress. She has no wheezes. She exhibits no tenderness.  Abdominal: Soft. Bowel sounds are normal. She exhibits no distension, no abdominal bruit and no mass. There is no tenderness.  Genitourinary: No breast swelling, tenderness, discharge or bleeding.  Genitourinary Comments: Breast exam: No mass, nodules, thickening, tenderness, bulging, retraction, inflamation, nipple discharge or skin changes noted.  No axillary or clavicular LA.      Musculoskeletal: Normal range of motion. She exhibits no edema or tenderness.  Lymphadenopathy:    She has no cervical adenopathy.  Neurological: She is alert. She has normal reflexes. No cranial nerve deficit. She exhibits normal muscle tone. Coordination normal.  Skin: Skin is warm and dry. No rash noted. No erythema. No pallor.  Some lentigines / angiomas  Few SK  Psychiatric: She has a normal mood and affect.          Assessment & Plan:   Problem List Items Addressed This Visit      Cardiovascular and Mediastinum   Essential hypertension - Primary    bp in fair control at this time  BP Readings from Last 1 Encounters:  05/02/17 126/64   No changes needed Disc lifstyle change with low sodium diet and exercise  Labs reviewed       Relevant Medications   rosuvastatin (CRESTOR) 10 MG tablet   amLODipine-benazepril (LOTREL) 5-10 MG capsule     Digestive   G E R D    Taking omeprazole in am and ranitidine in pm  She woule like to eventually get off the ppi Disc gradual approach - but update if breakthrough symptoms        Relevant Medications   ranitidine (ZANTAC) 150 MG tablet   omeprazole (PRILOSEC) 20 MG capsule     Endocrine   Subclinical hyperthyroidism    Lab Results  Component Value Date   TSH 0.44  04/26/2017   Stable No symptoms  No plans to f/u with endo unless labs become abn        Musculoskeletal and Integument   Osteopenia    Does not want to repeat dexa at this time  No falls or  fx On ca and D Enc exercise         Other   Anemia, unspecified    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        Anxiety    Worse with stressors lately  Reviewed stressors/ coping techniques/symptoms/ support sources/ tx options and side effects in detail today  Takes xanax prn  Will start on low dose lexapro 10 mg  Discussed expectations of SSRI medication including time to effectiveness and mechanism of action, also poss of side effects (early and late)- including mental fuzziness, weight or appetite change, nausea and poss of worse dep or anxiety (even suicidal thoughts)  Pt voiced understanding and will stop med and update if this occurs   Update if problems  Can titrate up if needed      Relevant Medications   escitalopram (LEXAPRO) 10 MG tablet   Hyperlipidemia    Disc goals for lipids and reasons to control them Rev labs with pt Rev low sat fat diet in detail Continue crestor an diet       Relevant Medications   rosuvastatin (CRESTOR) 10 MG tablet   amLODipine-benazepril (LOTREL) 5-10 MG capsule   Routine general medical examination at a health care facility    Reviewed health habits including diet and exercise and skin cancer prevention Reviewed appropriate screening tests for age  Also reviewed health mt list, fam hx and immunization status , as well as social and family history    See HPI Will call pharmacy about tetanus booster  Declines dexa right now  Labs rev  amw reviewed  Enc regular exercise

## 2017-05-02 NOTE — Patient Instructions (Addendum)
Go ahead and move crestor to bedtime if it is more convenient   Stop the multivitamin and the B12 when you are finished with them   Think about starting an extra 20 minutes of exercise daily  Very important for mood/ well being and staying independent   If you want to update your tetanus shot- call a pharmacy   Call us when you are ready to schedule your bone density test    Let's go ahead and take lexapro daily  Take once daily - at the same time If you feel worse- stop it and let me know  If significant side effects also let me know

## 2017-05-03 NOTE — Assessment & Plan Note (Signed)
Does not want to repeat dexa at this time  No falls or fx On ca and D Enc exercise

## 2017-05-03 NOTE — Assessment & Plan Note (Signed)
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

## 2017-05-03 NOTE — Assessment & Plan Note (Signed)
Disc goals for lipids and reasons to control them Rev labs with pt Rev low sat fat diet in detail Continue crestor an diet

## 2017-05-03 NOTE — Assessment & Plan Note (Signed)
Lab Results  Component Value Date   TSH 0.44 04/26/2017   Stable No symptoms  No plans to f/u with endo unless labs become abn

## 2017-05-03 NOTE — Assessment & Plan Note (Signed)
Worse with stressors lately  Reviewed stressors/ coping techniques/symptoms/ support sources/ tx options and side effects in detail today  Takes xanax prn  Will start on low dose lexapro 10 mg  Discussed expectations of SSRI medication including time to effectiveness and mechanism of action, also poss of side effects (early and late)- including mental fuzziness, weight or appetite change, nausea and poss of worse dep or anxiety (even suicidal thoughts)  Pt voiced understanding and will stop med and update if this occurs   Update if problems  Can titrate up if needed

## 2017-05-03 NOTE — Assessment & Plan Note (Signed)
bp in fair control at this time  BP Readings from Last 1 Encounters:  05/02/17 126/64   No changes needed Disc lifstyle change with low sodium diet and exercise  Labs reviewed

## 2017-05-03 NOTE — Assessment & Plan Note (Signed)
Taking omeprazole in am and ranitidine in pm  She woule like to eventually get off the ppi Disc gradual approach - but update if breakthrough symptoms

## 2017-05-03 NOTE — Assessment & Plan Note (Signed)
Reviewed health habits including diet and exercise and skin cancer prevention Reviewed appropriate screening tests for age  Also reviewed health mt list, fam hx and immunization status , as well as social and family history    See HPI Will call pharmacy about tetanus booster  Declines dexa right now  Labs rev  amw reviewed  Enc regular exercise

## 2017-08-27 ENCOUNTER — Other Ambulatory Visit: Payer: Self-pay | Admitting: Family Medicine

## 2017-10-10 ENCOUNTER — Ambulatory Visit: Payer: Medicare Other | Admitting: Family Medicine

## 2017-10-10 ENCOUNTER — Encounter: Payer: Self-pay | Admitting: Family Medicine

## 2017-10-10 VITALS — BP 126/70 | HR 65 | Temp 99.1°F | Ht 60.0 in | Wt 145.5 lb

## 2017-10-10 DIAGNOSIS — R350 Frequency of micturition: Secondary | ICD-10-CM | POA: Diagnosis not present

## 2017-10-10 DIAGNOSIS — N3000 Acute cystitis without hematuria: Secondary | ICD-10-CM

## 2017-10-10 DIAGNOSIS — N390 Urinary tract infection, site not specified: Secondary | ICD-10-CM | POA: Insufficient documentation

## 2017-10-10 LAB — POC URINALSYSI DIPSTICK (AUTOMATED)
Bilirubin, UA: NEGATIVE
Blood, UA: NEGATIVE
Glucose, UA: NEGATIVE
Ketones, UA: NEGATIVE
NITRITE UA: NEGATIVE
PH UA: 6 (ref 5.0–8.0)
PROTEIN UA: NEGATIVE
Spec Grav, UA: 1.025 (ref 1.010–1.025)
Urobilinogen, UA: 0.2 E.U./dL

## 2017-10-10 MED ORDER — CEPHALEXIN 250 MG PO CAPS: 250.0000 mg | ORAL_CAPSULE | Freq: Two times a day (BID) | ORAL | 0 refills | Status: DC

## 2017-10-10 NOTE — Patient Instructions (Addendum)
Take the keflex as directed for suspected uti  Drink water  We will notify when culture comes back   Alert [place] if symptoms worsen or do not improve

## 2017-10-10 NOTE — Progress Notes (Signed)
Subjective:    Patient ID: Deborah Gardner, female    DOB: Oct 16, 1945, 72 y.o.   MRN: [id number]  HPI Here for urinary symptoms   Symptoms since 2 weekends ago (worse than usual)  Frequency  Incontinence- mixed  Had some burning /pain with urination and a chill with urination -improved now  No blood in urine  No fever (though 99.1 this am)  Some soreness over her bladder   A little nausea/no vomiting  Flank pain   Stress- husband has terminal cancer and hospice is in the house  She is struggling a bit    She took some azo -no relief   Has seen urology - has had w/u for chronic symptoms  Could not find anything  Has urge and stress incontinence     Wt Readings from Last 3 Encounters:  10/10/17 145 lb 8 oz (66 kg)  05/02/17 151 lb 8 oz (68.7 kg)  04/26/17 149 lb 4 oz (67.7 kg)   28.42 kg/m   UA with pos leukocytes  Results for orders placed or performed in visit on 10/10/17  POCT Urinalysis Dipstick (Automated)  Result Value Ref Range   Color, UA Yellow    Clarity, UA Hazy    Glucose, UA Negative    Bilirubin, UA Negative    Ketones, UA Negative    Spec Grav, UA 1.025 1.010 - 1.025   Blood, UA Negative    pH, UA 6.0 5.0 - 8.0   Protein, UA Negative    Urobilinogen, UA 0.2 0.2 or 1.0 E.U./dL   Nitrite, UA Negative    Leukocytes, UA Moderate (2+) (A) Negative      Patient Active Problem List   Diagnosis Date Noted  . UTI (urinary tract infection) 10/10/2017  . Anemia, unspecified 11/03/2016  . Memory change 11/03/2016  . Osteoarthritis of right knee 05/22/2016  . Routine general medical examination at a health care facility 12/02/2014  . Encounter for Medicare annual wellness exam 11/21/2013  . Anxiety 11/21/2013  . Subclinical hyperthyroidism 06/18/2012  . Pruritus ani 03/08/2011  . UNSPECIFIED ESOPHAGITIS 08/12/2010  . OSTEOARTHRITIS, GENERALIZED, HAND 03/21/2010  . 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 ROTATOR CUFF SYNDROME, LEFT 03/21/2010  .  ALLERGIC RHINITIS 11/03/2008  . ARTHRALGIA 10/08/2008  . ADENOMATOUS COLONIC POLYP 08/19/2007  . ESOPHAGITIS, REFLUX 08/19/2007  . HIATAL HERNIA 08/19/2007  . Hyperlipidemia 03/05/2007  . G E R D 03/05/2007  . Osteopenia 03/05/2007  . Essential hypertension 02/08/2007  . IBS 02/08/2007  . FIBROCYSTIC BREAST DISEASE 02/08/2007  . OSTEOARTHRITIS 02/08/2007  . [place] DISEASE, LUMBAR 02/08/2007  . STRESS INCONTINENCE 02/08/2007   Past Medical History:  Diagnosis Date  . Allergic rhinitis   . Arthritis   . Cataract    bil  . Colon polyps   . Esophagitis   . GERD (gastroesophageal reflux disease)   . HH (hiatus hernia)   . HTN (hypertension)   . Hyperlipidemia   . Increased pressure in the eye   . OA (osteoarthritis)    hands  . Osteoporosis   . Rectal pain    Past Surgical History:  Procedure Laterality Date  . ABDOMINAL HYSTERECTOMY     still has ovaries  . APPENDECTOMY    . BREAST CYST ASPIRATION  1/03  . CATARACT EXTRACTION  9/07  . ESOPHAGOGASTRODUODENOSCOPY  11/08  . HEMORRHOID SURGERY    . rectal sphincterotomy    . Retinal laser surgery     Social History   Tobacco Use  .  Smoking status: Never Smoker  . Smokeless tobacco: Never Used  Substance Use Topics  . Alcohol use: No    Alcohol/week: 0.0 oz  . Drug use: No   Family History  Problem Relation Age of Onset  . Colon cancer Father   . Pneumonia Father   . Thyroid disease Mother   . Depression Mother   . Hypertension Mother   . Osteoporosis Mother   . Liver disease Brother        from alcohol  . Breast cancer Paternal Aunt   . Alcohol abuse Brother        terminal   . Alcohol abuse Brother   . Thyroid disease Unknown        half sister  . Colon cancer Unknown        Aunt  . Breast cancer Maternal Aunt   . Breast cancer Paternal Aunt    Allergies  Allergen Reactions  . Atorvastatin Other (See Comments)    Memory loss   Current Outpatient Medications on File Prior to Visit  Medication  Sig Dispense Refill  . ALPRAZolam (XANAX) 0.5 MG tablet Take 1 tablet (0.5 mg total) by mouth daily as needed. 30 tablet 3  . amLODipine-benazepril (LOTREL) 5-10 MG capsule TAKE 1 CAPSULE BY MOUTH EVERY DAY 90 capsule 3  . aspirin 81 MG tablet Take 81 mg by mouth daily.    . Calcium Carb-Cholecalciferol (CALCIUM 600 + D PO) Take 1 tablet by mouth daily.    . Cholecalciferol (VITAMIN D) 1000 UNITS capsule Take 1,000 Units by mouth daily.     Marland Kitchen escitalopram (LEXAPRO) 10 MG tablet Take 1 tablet (10 mg total) by mouth daily. 30 tablet 11  . fish oil-omega-3 fatty acids 1000 MG capsule Take 1 g by mouth daily.     . fluticasone (FLONASE) 50 MCG/ACT nasal spray PLACE 2 SPRAYS INTO THE NOSE DAILY. 16 g 11  . Multiple Vitamin (MULTIVITAMIN PO) Take by mouth daily.      . NON FORMULARY Arthaffect (one scoop daily with apple juice)     . omeprazole (PRILOSEC) 20 MG capsule TAKE ONE CAPSULE BY MOUTH once a DAY BEFORE A MEAL 90 capsule 3  . ranitidine (ZANTAC) 150 MG tablet Take 1 tablet (150 mg total) by mouth at bedtime. 90 tablet 3  . rosuvastatin (CRESTOR) 10 MG tablet Take 1 tablet (10 mg total) by mouth daily. 90 tablet 3  . timolol (TIMOPTIC) 0.5 % ophthalmic solution Place 1 drop into both eyes 2 (two) times daily.      No current facility-administered medications on file prior to visit.     Review of Systems  Constitutional: Positive for fatigue. Negative for activity change, appetite change and fever.  HENT: Negative for congestion and sore throat.   Eyes: Negative for itching and visual disturbance.  Respiratory: Negative for cough and shortness of breath.   Cardiovascular: Negative for leg swelling.  Gastrointestinal: Negative for abdominal distention, abdominal pain, constipation, diarrhea and nausea.  Endocrine: Negative for cold intolerance and polydipsia.  Genitourinary: Positive for dysuria, frequency and urgency. Negative for difficulty urinating, flank pain, hematuria and pelvic  pain.  Musculoskeletal: Negative for myalgias.  Skin: Negative for rash.  Allergic/Immunologic: Negative for immunocompromised state.  Neurological: Negative for dizziness and weakness.  Hematological: Negative for adenopathy.       Objective:   Physical Exam  Constitutional: She appears well-developed and well-nourished. No distress.  Well appearing   HENT:  Head: Normocephalic and atraumatic.  Eyes: Pupils are equal, round, and reactive to light. Conjunctivae and EOM are normal.  Neck: Normal range of motion. Neck supple.  Cardiovascular: Normal rate, regular rhythm and normal heart sounds.  Pulmonary/Chest: Effort normal and breath sounds normal.  Abdominal: Soft. Bowel sounds are normal. She exhibits no distension. There is tenderness. There is no rebound.  No cva tenderness  Mild suprapubic tenderness  Musculoskeletal: She exhibits no edema.  Lymphadenopathy:    She has no cervical adenopathy.  Neurological: She is alert.  Skin: No rash noted.  Psychiatric: She has a normal mood and affect.  Seems generally tired/stressed  Disc her husband's health  Not tearful          Assessment & Plan:   Problem List Items Addressed This Visit      Genitourinary   UTI (urinary tract infection) - Primary    Urinary symptoms with ua pos for leukocytes (in setting of chronic mixed incontinence baseline) Cover with keflex Enc water intake  cx urine- change tx if necessary  Alert if symptoms worsen       Relevant Medications   cephALEXin (KEFLEX) 250 MG capsule   Other Relevant Orders   Urine Culture    Other Visit Diagnoses    Urinary frequency       Relevant Orders   POCT Urinalysis Dipstick (Automated) (Completed)

## 2017-10-11 NOTE — Assessment & Plan Note (Signed)
Urinary symptoms with ua pos for leukocytes (in setting of chronic mixed incontinence baseline) Cover with keflex Enc water intake  cx urine- change tx if necessary  Alert if symptoms worsen

## 2017-10-13 LAB — URINE CULTURE
MICRO NUMBER: 90412171
SPECIMEN QUALITY:: ADEQUATE

## 2017-10-15 ENCOUNTER — Telehealth: Payer: Self-pay | Admitting: *Deleted

## 2017-10-15 MED ORDER — NITROFURANTOIN MONOHYD MACRO 100 MG PO CAPS: 100.0000 mg | ORAL_CAPSULE | Freq: Two times a day (BID) | ORAL | 0 refills | Status: DC

## 2017-10-15 NOTE — Telephone Encounter (Signed)
Rx sent and pt notified.

## 2017-10-15 NOTE — Telephone Encounter (Signed)
-----   Message from Abner Greenspan, MD sent at 10/14/2017  6:12 PM EDT ----- Urine grew out enterococcus -this may not be sensitive to the antibiotic I gave her  Please send in macrobid 100 mg 1 po bid for 7 d #10 no refills Let me know how she is feeling please

## 2017-11-23 ENCOUNTER — Ambulatory Visit: Payer: Medicare Other | Admitting: Family Medicine

## 2017-11-23 ENCOUNTER — Telehealth: Payer: Self-pay | Admitting: Family Medicine

## 2017-11-23 ENCOUNTER — Encounter: Payer: Self-pay | Admitting: Family Medicine

## 2017-11-23 VITALS — BP 120/80 | HR 54 | Temp 98.7°F | Ht 61.0 in | Wt 146.6 lb

## 2017-11-23 DIAGNOSIS — L237 Allergic contact dermatitis due to plants, except food: Secondary | ICD-10-CM

## 2017-11-23 MED ORDER — TRIAMCINOLONE ACETONIDE 0.1 % EX CREA: 1.0000 "application " | TOPICAL_CREAM | Freq: Two times a day (BID) | CUTANEOUS | 0 refills | Status: DC

## 2017-11-23 NOTE — Telephone Encounter (Signed)
Copied from [place] [phone]. Topic: Quick Communication - See Telephone Encounter >> Nov 23, 2017 10:50 AM Ether Griffins B wrote: CRM for notification. See Telephone encounter for: 11/23/17.  Pt has gotten in to some poison oak and is hoping a zpack can be called in to [id number] [place], [place] - [address]. Her husband has terminal cancer and it would be hard for her to come in today due to that. It is on her arms (worst places), neck, and face is starting to itch. She has taken zpack in the past for this. Pt would like to know if Dr. Glori Bickers is able to do this for her.

## 2017-11-23 NOTE — Patient Instructions (Addendum)
  Use the triamcinolone cream to your arms and neck.  Only use over-the-counter hydrocortisone on your face if needed.  Try to avoid scratching the areas.  You can take Benadryl at bedtime if needed.  Use cool compresses on the areas to help with itching as well.  If the areas become more blistered and start draining then you may get an over-the-counter astringent such as [place] to help dry this up.  If you get worse over the weekend or if this spreads to your face then let us know.

## 2017-11-23 NOTE — Telephone Encounter (Signed)
Pt said she is going to a UC that's closer to her home and doesn't need anything further from [place]

## 2017-11-23 NOTE — Telephone Encounter (Signed)
Spoke to pt and advised, she will need an appt. Pt states she is not wanting to come into office but will contact us back if wanting to schedule

## 2017-11-23 NOTE — Progress Notes (Signed)
   Subjective:    Patient ID: Deborah Gardner, female    DOB: February 20, 1946, 72 y.o.   MRN: [id number]  HPI Chief Complaint  Patient presents with  . posion ivy    posion ivy on arms and neck 2 days ago   She is here with complaints of a pruritic rash to her bilateral anterior forearms and anterior neck for the past 24 hours.  States she was working in some weeds 2 days ago and thinks she may have been exposed to poison ivy.  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 She has not tried anything for the rash yet.   Denies fever, chills, nausea, vomiting.  Review of Systems Pertinent positives and negatives in the history of present illness.     Objective:   Physical Exam BP 120/80   Pulse (!) 54   Temp 98.7 F (37.1 C) (Oral)   Ht 5\' 1"  (1.549 m)   Wt 146 lb 9.6 oz (66.5 kg)   SpO2 97%   BMI 27.70 kg/m   Alert and oriented and in no acute distress. Normal pharyngeal exam. Heart exam reveals a RRR and lungs are clear and normal work of breathing.  Patch of pruritic red bumps with some vesicles to her bilateral anterior wrists and a linear raised pruritic rash on her anterior neck. No other rash observed.  Skin is warm and dry.       Assessment & Plan:  Contact dermatitis due to poison ivy - Plan: triamcinolone cream (KENALOG) 0.1 %  No sign of secondary bacterial infection.  Plan to treat her with topical triamcinolone.  She is aware that if a rash develops on her forehead where she is currently itching that she should use over-the-counter hydrocortisone.  Discussed that if the vesicles enlarge and burst that she should use over-the-counter Domeboro and keep them covered.  Advised to take 1/2 to 1 whole Benadryl for itching at bedtime and use cool compresses. Discussed that we will hold off on steroids for now and if her rash worsens she will let us know.

## 2017-11-23 NOTE — Telephone Encounter (Signed)
zpak is an antibiotic for a bacterial infection  Poison ivy/oak is not an infection -it is an allergic dermatitis  Please get more details about her symptoms

## 2017-11-24 ENCOUNTER — Other Ambulatory Visit: Payer: Self-pay | Admitting: Family Medicine

## 2017-11-24 MED ORDER — PREDNISONE 10 MG (21) PO TBPK: ORAL_TABLET | Freq: Every day | ORAL | 0 refills | Status: DC

## 2017-11-24 NOTE — Progress Notes (Signed)
   Subjective:    Patient ID: Deborah Gardner, female    DOB: 1946/06/16, 71 y.o.   MRN: [id number]  HPI    Review of Systems     Objective:   Physical Exam        Assessment & Plan:

## 2017-11-26 ENCOUNTER — Other Ambulatory Visit: Payer: Self-pay | Admitting: Family Medicine

## 2017-11-26 DIAGNOSIS — Z1231 Encounter for screening mammogram for malignant neoplasm of breast: Secondary | ICD-10-CM

## 2017-11-27 ENCOUNTER — Telehealth: Payer: Self-pay | Admitting: Family Medicine

## 2017-11-27 ENCOUNTER — Other Ambulatory Visit: Payer: Self-pay | Admitting: Family Medicine

## 2017-11-27 MED ORDER — HYDROXYZINE HCL 25 MG PO TABS: 25.0000 mg | ORAL_TABLET | Freq: Once | ORAL | 0 refills | Status: AC

## 2017-11-27 NOTE — Telephone Encounter (Signed)
I would not want her to get a shot on top of oral steroids. We can try some hydroxyzine for the itching if Benadryl is not helping. 25 mg to take at bedtime. This is sedating so she could take it during the daytime but no driving or operating machinery. Also, please make sure she does not think any areas are becoming infected. Thanks.

## 2017-11-27 NOTE — Telephone Encounter (Signed)
Called [facility] for Hydroxyzine 25 mg at bedtime   #30  No refills.

## 2017-11-27 NOTE — Telephone Encounter (Signed)
Patient called and states she is itching so bad she can't sleep at night, missed 2 nights. Can she come in an get a steroid shot?  Please advise Beverlee Nims

## 2017-11-27 NOTE — Telephone Encounter (Signed)
Advised patient of same.  She understood.

## 2017-12-18 ENCOUNTER — Ambulatory Visit
Admission: RE | Admit: 2017-12-18 | Discharge: 2017-12-18 | Disposition: A | Discharge status: Home / Self Care | Payer: Medicare Other | Source: Ambulatory Visit | Attending: Family Medicine | Admitting: Family Medicine

## 2017-12-18 DIAGNOSIS — Z1231 Encounter for screening mammogram for malignant neoplasm of breast: Secondary | ICD-10-CM

## 2018-03-25 ENCOUNTER — Other Ambulatory Visit: Payer: Self-pay | Admitting: *Deleted

## 2018-03-25 MED ORDER — ESCITALOPRAM OXALATE 10 MG PO TABS: 10.0000 mg | ORAL_TABLET | Freq: Every day | ORAL | 1 refills | Status: DC

## 2018-05-02 ENCOUNTER — Ambulatory Visit (INDEPENDENT_AMBULATORY_CARE_PROVIDER_SITE_OTHER): Payer: Medicare Other

## 2018-05-02 VITALS — BP 110/78 | HR 52 | Temp 97.6°F | Ht 61.0 in | Wt 142.5 lb

## 2018-05-02 DIAGNOSIS — D649 Anemia, unspecified: Secondary | ICD-10-CM | POA: Diagnosis not present

## 2018-05-02 DIAGNOSIS — Z0000 Encounter for general adult medical examination without abnormal findings: Secondary | ICD-10-CM

## 2018-05-02 DIAGNOSIS — E785 Hyperlipidemia, unspecified: Secondary | ICD-10-CM | POA: Diagnosis not present

## 2018-05-02 DIAGNOSIS — E0590 Thyrotoxicosis, unspecified without thyrotoxic crisis or storm: Secondary | ICD-10-CM

## 2018-05-02 DIAGNOSIS — I10 Essential (primary) hypertension: Secondary | ICD-10-CM

## 2018-05-02 LAB — COMPREHENSIVE METABOLIC PANEL
ALK PHOS: 65 U/L (ref 39–117)
ALT: 9 U/L (ref 0–35)
AST: 12 U/L (ref 0–37)
Albumin: 4.4 g/dL (ref 3.5–5.2)
BUN: 17 mg/dL (ref 6–23)
CHLORIDE: 104 meq/L (ref 96–112)
CO2: 27 meq/L (ref 19–32)
Calcium: 9.7 mg/dL (ref 8.4–10.5)
Creatinine, Ser: 0.73 mg/dL (ref 0.40–1.20)
GFR: 82.65 mL/min (ref >60.00)
GLUCOSE: 96 mg/dL (ref 70–99)
POTASSIUM: 4.1 meq/L (ref 3.5–5.1)
Sodium: 140 mEq/L (ref 135–145)
Total Bilirubin: 0.5 mg/dL (ref 0.2–1.2)
Total Protein: 7.3 g/dL (ref 6.0–8.3)

## 2018-05-02 LAB — CBC WITH DIFFERENTIAL/PLATELET
Basophils Absolute: 0.1 10*3/uL (ref 0.0–0.1)
Basophils Relative: 0.8 % (ref 0.0–3.0)
EOS PCT: 3.7 % (ref 0.0–5.0)
Eosinophils Absolute: 0.3 10*3/uL (ref 0.0–0.7)
HCT: 37.8 % (ref 36.0–46.0)
Hemoglobin: 12.9 g/dL (ref 12.0–15.0)
LYMPHS ABS: 2 10*3/uL (ref 0.7–4.0)
Lymphocytes Relative: 28.6 % (ref 12.0–46.0)
MCHC: 34 g/dL (ref 30.0–36.0)
MCV: 88.7 fl (ref 78.0–100.0)
MONOS PCT: 9 % (ref 3.0–12.0)
Monocytes Absolute: 0.6 10*3/uL (ref 0.1–1.0)
NEUTROS ABS: 4.1 10*3/uL (ref 1.4–7.7)
NEUTROS PCT: 57.9 % (ref 43.0–77.0)
Platelets: 224 10*3/uL (ref 150.0–400.0)
RBC: 4.26 Mil/uL (ref 3.87–5.11)
RDW: 13.4 % (ref 11.5–15.5)
WBC: 7 10*3/uL (ref 4.0–10.5)

## 2018-05-02 LAB — TSH: TSH: 0.47 u[IU]/mL (ref 0.35–4.50)

## 2018-05-02 LAB — LIPID PANEL
Cholesterol: 139 mg/dL (ref 0–200)
HDL: 67.7 mg/dL (ref >39.00)
LDL CALC: 59 mg/dL (ref 0–99)
NonHDL: 71.11
TRIGLYCERIDES: 63 mg/dL (ref 0.0–149.0)
Total CHOL/HDL Ratio: 2
VLDL: 12.6 mg/dL (ref 0.0–40.0)

## 2018-05-02 LAB — IBC PANEL
Iron: 113 ug/dL (ref 42–145)
Saturation Ratios: 26 % (ref 20.0–50.0)
Transferrin: 310 mg/dL (ref 212.0–360.0)

## 2018-05-02 LAB — T4, FREE: Free T4: 0.67 ng/dL (ref 0.60–1.60)

## 2018-05-02 NOTE — Progress Notes (Signed)
Subjective:   Deborah Gardner is a 72 y.o. female who presents for Medicare Annual (Subsequent) preventive examination.  Review of Systems:  N/A Cardiac Risk Factors include: advanced age (>61men, >8 women);dyslipidemia;hypertension     Objective:     Vitals: BP 110/78 (BP Location: Right Arm, Patient Position: Sitting, Cuff Size: Normal)   Pulse (!) 52   Temp 97.6 F (36.4 C) (Oral)   Ht 5\' 1"  (1.549 m) Comment: SHOES  Wt 142 lb 8 oz (64.6 kg)   SpO2 98%   BMI 26.93 kg/m   Body mass index is 26.93 kg/m.  Advanced Directives 05/02/2018 04/26/2017 04/25/2016  Does Patient Have a Medical Advance Directive? Yes Yes Yes  Type of Paramedic of [place];Living will [place];Living will [place];Living will  Does patient want to make changes to medical advance directive? - - No - Patient declined  Copy of [place] in Chart? No - copy requested No - copy requested No - copy requested    Tobacco Social History   Tobacco Use  Smoking Status Never Smoker  Smokeless Tobacco Never Used     Counseling given: No   Clinical Intake:  Pre-visit preparation completed: Yes  Pain : No/denies pain Pain Score: 0-No pain     Nutritional Status: BMI 25 -29 Overweight Nutritional Risks: None Diabetes: No  How often do you need to have someone help you when you read instructions, pamphlets, or other written materials from your doctor or pharmacy?: 1 - Never What is the last grade level you completed in school?: 12th grade  Interpreter Needed?: No  Comments: pt is a widow and lives alone Information entered by :: LPinson, LPN  Past Medical History:  Diagnosis Date  . Allergic rhinitis   . Arthritis   . Cataract    bil  . Colon polyps   . Esophagitis   . GERD (gastroesophageal reflux disease)   . HH (hiatus hernia)   . HTN (hypertension)   . Hyperlipidemia   . Increased pressure in the  eye   . OA (osteoarthritis)    hands  . Osteoporosis   . Rectal pain    Past Surgical History:  Procedure Laterality Date  . ABDOMINAL HYSTERECTOMY     still has ovaries  . APPENDECTOMY    . BREAST CYST ASPIRATION  1/03  . CATARACT EXTRACTION  9/07  . ESOPHAGOGASTRODUODENOSCOPY  11/08  . HEMORRHOID SURGERY    . rectal sphincterotomy    . Retinal laser surgery     Family History  Problem Relation Age of Onset  . Colon cancer Father   . Pneumonia Father   . Thyroid disease Mother   . Depression Mother   . Hypertension Mother   . Osteoporosis Mother   . Liver disease Brother        from alcohol  . Breast cancer Paternal Aunt   . Alcohol abuse Brother        terminal   . Alcohol abuse Brother   . Thyroid disease Unknown        half sister  . Colon cancer Unknown        Aunt  . Breast cancer Maternal Aunt   . Breast cancer Paternal Aunt    Social History   Socioeconomic History  . Marital status: Married    Spouse name: Not on file  . Number of children: 2  . Years of education: Not on file  .  Highest education level: Not on file  Occupational History  . Occupation: Retired    Fish farm manager: RETIRED  Social Needs  . Financial resource strain: Not on file  . Food insecurity:    Worry: Not on file    Inability: Not on file  . Transportation needs:    Medical: Not on file    Non-medical: Not on file  Tobacco Use  . Smoking status: Never Smoker  . Smokeless tobacco: Never Used  Substance and Sexual Activity  . Alcohol use: No    Alcohol/week: 0.0 standard drinks  . Drug use: No  . Sexual activity: Yes    Partners: Male    Birth control/protection: Surgical  Lifestyle  . Physical activity:    Days per week: Not on file    Minutes per session: Not on file  . Stress: Not on file  Relationships  . Social connections:    Talks on phone: Not on file    Gets together: Not on file    Attends religious service: Not on file    Active member of club or  organization: Not on file    Attends meetings of clubs or organizations: Not on file    Relationship status: Not on file  Other Topics Concern  . Not on file  Social History Narrative   Married      2 children      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 with husband,  and cares for her brother--who had alcohol and stroke      Cared for elderly mother for years (she passed in 2009)    Outpatient Encounter Medications as of 05/02/2018  Medication Sig  . ALPRAZolam (XANAX) 0.5 MG tablet Take 1 tablet (0.5 mg total) by mouth daily as needed.  Marland Kitchen amLODipine-benazepril (LOTREL) 5-10 MG capsule TAKE 1 CAPSULE BY MOUTH EVERY DAY  . aspirin 81 MG tablet Take 81 mg by mouth daily.  . Calcium Carb-Cholecalciferol (CALCIUM 600 + D PO) Take 1 tablet by mouth daily.  . Cholecalciferol (VITAMIN D) 1000 UNITS capsule Take 1,000 Units by mouth daily.   Marland Kitchen escitalopram (LEXAPRO) 10 MG tablet Take 1 tablet (10 mg total) by mouth daily.  . fluticasone (FLONASE) 50 MCG/ACT nasal spray PLACE 2 SPRAYS INTO THE NOSE DAILY. (Patient taking differently: as needed. PLACE 2 SPRAYS INTO THE NOSE DAILY.)  . MELATONIN PO Take 5 mg by mouth as needed.  Marland Kitchen omeprazole (PRILOSEC) 20 MG capsule TAKE ONE CAPSULE BY MOUTH once a DAY BEFORE A MEAL  . ranitidine (ZANTAC) 150 MG tablet Take 1 tablet (150 mg total) by mouth at bedtime.  . rosuvastatin (CRESTOR) 10 MG tablet Take 1 tablet (10 mg total) by mouth daily.  . timolol (TIMOPTIC) 0.5 % ophthalmic solution Place 1 drop into both eyes 2 (two) times daily.   . [DISCONTINUED] fish oil-omega-3 fatty acids 1000 MG capsule Take 1 g by mouth daily.   . [DISCONTINUED] NON FORMULARY Arthaffect (one scoop daily with apple juice)   . [DISCONTINUED] predniSONE (STERAPRED UNI-PAK 21 TAB) 10 MG (21) TBPK tablet Take by mouth daily. As directed by manufacturer.  . [DISCONTINUED] triamcinolone cream (KENALOG) 0.1 % Apply 1 application topically 2 (two) times daily.   No  facility-administered encounter medications on file as of 05/02/2018.     Activities of Daily Living In your present state of health, do you have any difficulty performing the following activities: 05/02/2018  Hearing? N  Vision? N  Difficulty concentrating or making  decisions? N  Walking or climbing stairs? Y  Dressing or bathing? N  Doing errands, shopping? N  Preparing Food and eating ? N  Using the Toilet? N  In the past six months, have you accidently leaked urine? Y  Do you have problems with loss of bowel control? N  Managing your Medications? N  Managing your Finances? N  Housekeeping or managing your Housekeeping? N  Some recent data might be hidden    Patient Care Team: Tower, Wynelle [name], MD as PCP - General    Assessment:   This is a routine wellness examination for Saydi.   Hearing Screening   125Hz  250Hz  500Hz  1000Hz  2000Hz  3000Hz  4000Hz  6000Hz  8000Hz   Right ear:   40 40 40  0    Left ear:   40 40 40  40    Vision Screening Comments: Vision exam in 2019 with Dr. Carlean Jews   Exercise Activities and Dietary recommendations Current Exercise Habits: The patient does not participate in regular exercise at present, Exercise limited by: None identified  Goals    . Increase water intake     Starting 05/02/2018, I will attempt to drink at least 8 oz of water with each meal.        Fall Risk Fall Risk  05/02/2018 04/26/2017 04/25/2016 12/02/2014 11/21/2013  Falls in the past year? No No Yes No Yes  Comment - - accidental fall due to slippery shoes outside.  no injury.  - -  Number falls in past yr: - - 1 - 1  Injury with Fall? - - No - Yes  Risk for fall due to : - - History of fall(s) - -  Follow up - - Falls evaluation completed - -   Depression Screen PHQ 2/9 Scores 05/02/2018 04/26/2017 04/25/2016 12/02/2014  PHQ - 2 Score 0 0 0 0  PHQ- 9 Score 0 0 - -     Cognitive Function MMSE - Mini Mental State Exam 05/02/2018 04/26/2017 04/25/2016    Orientation to time 5 5 5   Orientation to Place 5 5 5   Registration 3 3 3   Attention/ Calculation 0 0 0  Recall 3 3 3   Language- name 2 objects 0 0 0  Language- repeat 1 1 1   Language- follow 3 step command 3 3 3   Language- read & follow direction 0 0 0  Write a sentence 0 0 0  Copy design 0 0 0  Total score 20 20 20      PLEASE NOTE: A Mini-Cog screen was completed. Maximum score is 20. A value of 0 denotes this part of [place] MMSE was not completed or the patient failed this part of the Mini-Cog screening.   Mini-Cog Screening Orientation to Time - Max 5 pts Orientation to Place - Max 5 pts Registration - Max 3 pts Recall - Max 3 pts Language Repeat - Max 1 pts Language Follow 3 Step Command - Max 3 pts     Immunization History  Administered Date(s) Administered  . Influenza Split 03/11/2011, 03/28/2012, 04/10/2016  . Influenza Whole 05/13/2010  . Influenza, High Dose Seasonal PF 05/01/2013, 05/18/2014, 09/10/2015, 04/10/2016, 04/12/2017  . Pneumococcal Conjugate-13 12/02/2014  . Pneumococcal Polysaccharide-23 03/14/2011  . Td 08/30/2004  . Zoster 11/24/2009   Screening Tests Health Maintenance  Topic Date Due  . INFLUENZA VACCINE  05/08/2018 (Originally 02/07/2018)  . COLONOSCOPY  06/10/2019 (Originally 04/10/2018)  . TETANUS/TDAP  08/29/2024 (Originally 08/30/2014)  . MAMMOGRAM  12/19/2018  . DEXA SCAN  Completed  . Hepatitis C Screening  Completed  . PNA vac Low Risk Adult  Completed       Plan:     I have personally reviewed, addressed, and noted the following in the patient's chart:  A. Medical and social history B. Use of alcohol, tobacco or illicit drugs  C. Current medications and supplements D. Functional ability and status E.  Nutritional status F.  Physical activity G. Advance directives H. List of other physicians I.  Hospitalizations, surgeries, and ER visits in previous 12 months J.  [place] to include hearing, vision,  cognitive, depression L. Referrals and appointments - none  In addition, I have reviewed and discussed with patient certain preventive protocols, quality metrics, and best practice recommendations. A written personalized care plan for preventive services as well as general preventive health recommendations were provided to patient.  See attached scanned questionnaire for additional information.   Signed,   Lindell Noe, MHA, BS, LPN Health Coach

## 2018-05-02 NOTE — Patient Instructions (Signed)
Deborah Gardner , Thank you for taking time to come for your Medicare Wellness Visit. I appreciate your ongoing commitment to your health goals. Please review the following plan we discussed and let me know if I can assist you in the future.   These are the goals we discussed: Goals    . Increase water intake     Starting 05/02/2018, I will attempt to drink at least 8 oz of water with each meal.        This is a list of the screening recommended for you and due dates:  Health Maintenance  Topic Date Due  . Flu Shot  05/08/2018*  . Colon Cancer Screening  06/10/2019*  . Tetanus Vaccine  08/29/2024*  . Mammogram  12/19/2018  . DEXA scan (bone density measurement)  Completed  .  Hepatitis C: One time screening is recommended by Center for Disease Control  (CDC) for  adults born from [birth year] through 1967.   Completed  . Pneumonia vaccines  Completed  *Topic was postponed. The date shown is not the original due date.   Preventive Care for Adults  A healthy lifestyle and preventive care can promote health and wellness. Preventive health guidelines for adults include the following key practices.  . A routine yearly physical is a good way to check with your health care provider about your health and preventive screening. It is a chance to share any concerns and updates on your health and to receive a thorough exam.  . Visit your dentist for a routine exam and preventive care every 6 months. Brush your teeth twice a day and floss once a day. Good oral hygiene prevents tooth decay and gum disease.  . The frequency of eye exams is based on your age, health, family medical history, use  of contact lenses, and other factors. Follow your health care provider's recommendations for frequency of eye exams.  . Eat a healthy diet. Foods like vegetables, fruits, whole grains, low-fat dairy products, and lean protein foods contain the nutrients you need without too many calories. Decrease your intake of foods  high in solid fats, added sugars, and salt. Eat the right amount of calories for you. Get information about a proper diet from your health care provider, if necessary.  . Regular physical exercise is one of the most important things you can do for your health. Most adults should get at least 150 minutes of moderate-intensity exercise (any activity that increases your heart rate and causes you to sweat) each week. In addition, most adults need muscle-strengthening exercises on 2 or more days a week.  Silver Sneakers may be a benefit available to you. To determine eligibility, you may visit the website: www.silversneakers.com or contact program at 959-319-0489 Mon-Fri between 8AM-8PM.   . Maintain a healthy weight. The body mass index (BMI) is a screening tool to identify possible weight problems. It provides an estimate of body fat based on height and weight. Your health care provider can find your BMI and can help you achieve or maintain a healthy weight.   For adults 20 years and older: ? A BMI below 18.5 is considered underweight. ? A BMI of 18.5 to 24.9 is normal. ? A BMI of 25 to 29.9 is considered overweight. ? A BMI of 30 and above is considered obese.   . Maintain normal blood lipids and cholesterol levels by exercising and minimizing your intake of saturated fat. Eat a balanced diet with plenty of fruit and vegetables. Blood tests  for lipids and cholesterol should begin at age 42 and be repeated every 5 years. If your lipid or cholesterol levels are high, you are over 50, or you are at high risk for heart disease, you may need your cholesterol levels checked more frequently. Ongoing high lipid and cholesterol levels should be treated with medicines if diet and exercise are not working.  . If you smoke, find out from your health care provider how to quit. If you do not use tobacco, please do not start.  . If you choose to drink alcohol, please do not consume more than 2 drinks per day.  One drink is considered to be 12 ounces (355 mL) of beer, 5 ounces (148 mL) of wine, or 1.5 ounces (44 mL) of liquor.  . If you are 21-64 years old, ask your health care provider if you should take aspirin to prevent strokes.  . Use sunscreen. Apply sunscreen liberally and repeatedly throughout the day. You should seek shade when your shadow is shorter than you. Protect yourself by wearing long sleeves, pants, a wide-brimmed hat, and sunglasses year round, whenever you are outdoors.  . Once a month, do a whole body skin exam, using a mirror to look at the skin on your back. Tell your health care provider of new moles, moles that have irregular borders, moles that are larger than a pencil eraser, or moles that have changed in shape or color.

## 2018-05-02 NOTE — Progress Notes (Signed)
PCP notes:   Health maintenance:  Flu vaccine - pt requests to have vaccine at CPE   Abnormal screenings:   Hearing - failed  Hearing Screening   125Hz  250Hz  500Hz  1000Hz  2000Hz  3000Hz  4000Hz  6000Hz  8000Hz   Right ear:   40 40 40  0    Left ear:   40 40 40  40     Patient concerns:   None  Nurse concerns:  None  Next PCP appt:   05/07/2018 @ 1015  I reviewed health advisor's note, was available for consultation, and agree with documentation and plan. Loura Pardon MD

## 2018-05-06 ENCOUNTER — Encounter: Payer: Self-pay | Admitting: Gastroenterology

## 2018-05-07 ENCOUNTER — Encounter: Payer: Self-pay | Admitting: Family Medicine

## 2018-05-07 ENCOUNTER — Encounter: Payer: Self-pay | Admitting: Gastroenterology

## 2018-05-07 ENCOUNTER — Ambulatory Visit (INDEPENDENT_AMBULATORY_CARE_PROVIDER_SITE_OTHER): Payer: Medicare Other | Admitting: Family Medicine

## 2018-05-07 VITALS — BP 134/78 | HR 51 | Temp 98.3°F | Ht 61.0 in | Wt 145.2 lb

## 2018-05-07 DIAGNOSIS — E7800 Pure hypercholesterolemia, unspecified: Secondary | ICD-10-CM

## 2018-05-07 DIAGNOSIS — D126 Benign neoplasm of colon, unspecified: Secondary | ICD-10-CM

## 2018-05-07 DIAGNOSIS — I10 Essential (primary) hypertension: Secondary | ICD-10-CM

## 2018-05-07 DIAGNOSIS — E2839 Other primary ovarian failure: Secondary | ICD-10-CM

## 2018-05-07 DIAGNOSIS — E0590 Thyrotoxicosis, unspecified without thyrotoxic crisis or storm: Secondary | ICD-10-CM

## 2018-05-07 DIAGNOSIS — N8111 Cystocele, midline: Secondary | ICD-10-CM

## 2018-05-07 DIAGNOSIS — M8589 Other specified disorders of bone density and structure, multiple sites: Secondary | ICD-10-CM

## 2018-05-07 DIAGNOSIS — Z23 Encounter for immunization: Secondary | ICD-10-CM | POA: Diagnosis not present

## 2018-05-07 DIAGNOSIS — Z0000 Encounter for general adult medical examination without abnormal findings: Secondary | ICD-10-CM | POA: Diagnosis not present

## 2018-05-07 DIAGNOSIS — R350 Frequency of micturition: Secondary | ICD-10-CM | POA: Diagnosis not present

## 2018-05-07 DIAGNOSIS — N3281 Overactive bladder: Secondary | ICD-10-CM | POA: Insufficient documentation

## 2018-05-07 DIAGNOSIS — N819 Female genital prolapse, unspecified: Secondary | ICD-10-CM | POA: Insufficient documentation

## 2018-05-07 LAB — POC URINALSYSI DIPSTICK (AUTOMATED)
BILIRUBIN UA: NEGATIVE
Glucose, UA: NEGATIVE
KETONES UA: NEGATIVE
Leukocytes, UA: NEGATIVE
Nitrite, UA: NEGATIVE
PH UA: 6 (ref 5.0–8.0)
Protein, UA: NEGATIVE
RBC UA: NEGATIVE
SPEC GRAV UA: 1.02 (ref 1.010–1.025)
Urobilinogen, UA: 0.2 E.U./dL

## 2018-05-07 MED ORDER — OMEPRAZOLE 20 MG PO CPDR: DELAYED_RELEASE_CAPSULE | ORAL | 3 refills | Status: DC

## 2018-05-07 MED ORDER — AMLODIPINE BESY-BENAZEPRIL HCL 5-10 MG PO CAPS: ORAL_CAPSULE | ORAL | 3 refills | Status: DC

## 2018-05-07 MED ORDER — ROSUVASTATIN CALCIUM 10 MG PO TABS: 10.0000 mg | ORAL_TABLET | Freq: Every day | ORAL | 3 refills | Status: DC

## 2018-05-07 MED ORDER — ESCITALOPRAM OXALATE 10 MG PO TABS: 10.0000 mg | ORAL_TABLET | Freq: Every day | ORAL | 3 refills | Status: DC

## 2018-05-07 NOTE — Assessment & Plan Note (Signed)
Reviewed health habits including diet and exercise and skin cancer prevention Reviewed appropriate screening tests for age  Also reviewed health mt list, fam hx and immunization status , as well as social and family history   See HPI Labs rev amw rev  Colonoscopy and dexa ref done  ua clear Overall good self care

## 2018-05-07 NOTE — Assessment & Plan Note (Signed)
Due for recall colonoscopy  Ref done

## 2018-05-07 NOTE — Assessment & Plan Note (Addendum)
Cystocele on exam today (also has overactive bladder and some urinary incontinence)  Past partial hysterectomy occ c/o pelvic discomfort and pressure req gyn ref - that was done

## 2018-05-07 NOTE — Assessment & Plan Note (Signed)
Disc goals for lipids and reasons to control them Rev last labs with pt Rev low sat fat diet in detail Well controled with crestor and diet

## 2018-05-07 NOTE — Assessment & Plan Note (Signed)
bp in fair control at this time  BP Readings from Last 1 Encounters:  05/07/18 134/78   No changes needed Most recent labs reviewed  Disc lifstyle change with low sodium diet and exercise

## 2018-05-07 NOTE — Patient Instructions (Signed)
Please give a urine specimen on the way out   We will refer for gyn, and colonoscopy and bone density   Stay active I'm glad you are doing well

## 2018-05-07 NOTE — Assessment & Plan Note (Signed)
dexa ordered for f/u  No falls or fx Taking ca and D Enc exercise

## 2018-05-07 NOTE — Progress Notes (Signed)
Subjective:    Patient ID: Deborah Gardner, female    DOB: [birth year], 72 y.o.   MRN: [id number]  HPI Here for health maintenance exam and to review chronic medical problems    Lost her husband aug 5th  Thinks she is doing very well with it  Has a lot of support  Has opt for counseling at hospice if she needs it  Is adjusting  Also a good church support group   Feels good overall  Quite active lately - working in yard/getting a lot done  Will have time to socialize soon   Wt Readings from Last 3 Encounters:  05/07/18 145 lb 4 oz (65.9 kg)  05/02/18 142 lb 8 oz (64.6 kg)  11/23/17 146 lb 9.6 oz (66.5 kg)   27.44 kg/m   Had amw on 10/24 Missed high tone in R ear only (not a problem for her)   High dose flu vaccine given today   Desires pelvic exam  Had a hysterectomy in the past  (partial)  Noticed that she felt funny- in vulvar area during a bath - wonders about bladder prolapse  occ discomfort pelvic (not pain)-? Pressure / aware of it   Spot on leg -crusty / on L leg  Has varicose veins   Recent dental and eye exams   Colonoscopy 10/14 with 5 y recall  Father had colon cancer   Mammogram 6/19  Self breast exam - no lumps   dexa 4/14 osteopenia  Declined another dexa last year Wants to do now  No falls or fx  On ca and D  zostavax 5/11   bp is stable today  No cp or palpitations or headaches or edema  No side effects to medicines  BP Readings from Last 3 Encounters:  05/07/18 134/78  05/02/18 110/78  11/23/17 120/80     H/o subclinical hyperthyroidism No issues clinically  Lab Results  Component Value Date   TSH 0.47 05/02/2018    Hyperlipidemia Lab Results  Component Value Date   CHOL 139 05/02/2018   CHOL 142 04/26/2017   CHOL 164 04/25/2016   Lab Results  Component Value Date   HDL 67.70 05/02/2018   HDL 64.00 04/26/2017   HDL 70.50 04/25/2016   Lab Results  Component Value Date   LDLCALC 59 05/02/2018   LDLCALC 54 04/26/2017     Hamel 75 04/25/2016   Lab Results  Component Value Date   TRIG 63.0 05/02/2018   TRIG 121.0 04/26/2017   TRIG 91.0 04/25/2016   Lab Results  Component Value Date   CHOLHDL 2 05/02/2018   CHOLHDL 2 04/26/2017   CHOLHDL 2 04/25/2016   No results found for: LDLDIRECT crestor 10 mg daily  Getting back to better eating   Lab Results  Component Value Date   CREATININE 0.73 05/02/2018   BUN 17 05/02/2018   NA 140 05/02/2018   K 4.1 05/02/2018   CL 104 05/02/2018   CO2 27 05/02/2018   Lab Results  Component Value Date   ALT 9 05/02/2018   AST 12 05/02/2018   ALKPHOS 65 05/02/2018   BILITOT 0.5 05/02/2018    Glucose 96 Lab Results  Component Value Date   WBC 7.0 05/02/2018   HGB 12.9 05/02/2018   HCT 37.8 05/02/2018   MCV 88.7 05/02/2018   PLT 224.0 05/02/2018      Patient Active Problem List   Diagnosis Date Noted  . Estrogen deficiency 05/07/2018  . Pelvic prolapse 05/07/2018  .  Frequent urination 05/07/2018  . Memory change 11/03/2016  . Osteoarthritis of right knee 05/22/2016  . Routine general medical examination at a health care facility 12/02/2014  . Encounter for Medicare annual wellness exam 11/21/2013  . Anxiety 11/21/2013  . Subclinical hyperthyroidism 06/18/2012  . Pruritus ani 03/08/2011  . UNSPECIFIED ESOPHAGITIS 08/12/2010  . OSTEOARTHRITIS, GENERALIZED, HAND 03/21/2010  . 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 ROTATOR CUFF SYNDROME, LEFT 03/21/2010  . ALLERGIC RHINITIS 11/03/2008  . ARTHRALGIA 10/08/2008  . ADENOMATOUS COLONIC POLYP 08/19/2007  . ESOPHAGITIS, REFLUX 08/19/2007  . HIATAL HERNIA 08/19/2007  . Hyperlipidemia 03/05/2007  . G E R D 03/05/2007  . Osteopenia 03/05/2007  . Essential hypertension 02/08/2007  . IBS 02/08/2007  . FIBROCYSTIC BREAST DISEASE 02/08/2007  . OSTEOARTHRITIS 02/08/2007  . [place] DISEASE, LUMBAR 02/08/2007  . STRESS INCONTINENCE 02/08/2007   Past Medical History:  Diagnosis Date  . Allergic  rhinitis   . Arthritis   . Cataract    bil  . Colon polyps   . Esophagitis   . GERD (gastroesophageal reflux disease)   . HH (hiatus hernia)   . HTN (hypertension)   . Hyperlipidemia   . Increased pressure in the eye   . OA (osteoarthritis)    hands  . Osteoporosis   . Rectal pain    Past Surgical History:  Procedure Laterality Date  . ABDOMINAL HYSTERECTOMY     still has ovaries  . APPENDECTOMY    . BREAST CYST ASPIRATION  1/03  . CATARACT EXTRACTION  9/07  . ESOPHAGOGASTRODUODENOSCOPY  11/08  . HEMORRHOID SURGERY    . rectal sphincterotomy    . Retinal laser surgery     Social History   Tobacco Use  . Smoking status: Never Smoker  . Smokeless tobacco: Never Used  Substance Use Topics  . Alcohol use: No    Alcohol/week: 0.0 standard drinks  . Drug use: No   Family History  Problem Relation Age of Onset  . Colon cancer Father   . Pneumonia Father   . Thyroid disease Mother   . Depression Mother   . Hypertension Mother   . Osteoporosis Mother   . Liver disease Brother        from alcohol  . Breast cancer Paternal Aunt   . Alcohol abuse Brother        terminal   . Alcohol abuse Brother   . Thyroid disease Unknown        half sister  . Colon cancer Unknown        Aunt  . Breast cancer Maternal Aunt   . Breast cancer Paternal Aunt    Allergies  Allergen Reactions  . Atorvastatin Other (See Comments)    Memory loss   Current Outpatient Medications on File Prior to Visit  Medication Sig Dispense Refill  . ALPRAZolam (XANAX) 0.5 MG tablet Take 1 tablet (0.5 mg total) by mouth daily as needed. 30 tablet 3  . aspirin 81 MG tablet Take 81 mg by mouth daily.    . Calcium Carb-Cholecalciferol (CALCIUM 600 + D PO) Take 1 tablet by mouth daily.    . Cholecalciferol (VITAMIN D) 1000 UNITS capsule Take 1,000 Units by mouth daily.     . fluticasone (FLONASE) 50 MCG/ACT nasal spray PLACE 2 SPRAYS INTO THE NOSE DAILY. (Patient taking differently: as needed. PLACE 2  SPRAYS INTO THE NOSE DAILY.) 16 g 11  . MELATONIN PO Take 5 mg by mouth as needed.    . ranitidine (ZANTAC)  150 MG tablet Take 1 tablet (150 mg total) by mouth at bedtime. 90 tablet 3  . timolol (TIMOPTIC) 0.5 % ophthalmic solution Place 1 drop into both eyes 2 (two) times daily.      No current facility-administered medications on file prior to visit.     Review of Systems  Constitutional: Negative for activity change, appetite change, fatigue, fever and unexpected weight change.  HENT: Negative for congestion, ear pain, rhinorrhea, sinus pressure and sore throat.   Eyes: Negative for pain, redness and visual disturbance.  Respiratory: Negative for cough, shortness of breath and wheezing.   Cardiovascular: Negative for chest pain and palpitations.  Gastrointestinal: Negative for abdominal pain, blood in stool, constipation and diarrhea.  Endocrine: Negative for polydipsia and polyuria.  Genitourinary: Positive for frequency and urgency. Negative for decreased urine volume and dysuria.       Pelvic discomfort  prolapse  Musculoskeletal: Negative for arthralgias, back pain and myalgias.  Skin: Negative for pallor and rash.  Allergic/Immunologic: Negative for environmental allergies.  Neurological: Negative for dizziness, syncope and headaches.  Hematological: Negative for adenopathy. Does not bruise/bleed easily.  Psychiatric/Behavioral: Negative for decreased concentration and dysphoric mood. The patient is not nervous/anxious.        Grief Overall doing well        Objective:   Physical Exam  Constitutional: She appears well-developed and well-nourished. No distress.  Well appearing   HENT:  Head: Normocephalic and atraumatic.  Right Ear: External ear normal.  Left Ear: External ear normal.  Mouth/Throat: Oropharynx is clear and moist.  Eyes: Pupils are equal, round, and reactive to light. Conjunctivae and EOM are normal. No scleral icterus.  Neck: Normal range of motion. Neck  supple. No JVD present. Carotid bruit is not present. No thyromegaly present.  Cardiovascular: Normal rate, regular rhythm, normal heart sounds and intact distal pulses. Exam reveals no gallop.  Pulmonary/Chest: Effort normal and breath sounds normal. No respiratory distress. She has no wheezes. She has no rales. She exhibits no tenderness. No breast tenderness, discharge or bleeding.  Abdominal: Soft. Bowel sounds are normal. She exhibits no distension, no abdominal bruit and no mass. There is no tenderness.  Mild pelvic discomfort-worse on L  Genitourinary: No breast tenderness, discharge or bleeding.  Genitourinary Comments: Breast exam: No mass, nodules, thickening, tenderness, bulging, retraction, inflamation, nipple discharge or skin changes noted.  No axillary or clavicular LA.     Nl app ext genitalia Cystocele noted  Some midline tenderness on bimanual exam  Mild vaginal atrophy No M noted   Musculoskeletal: Normal range of motion. She exhibits no edema or tenderness.  Lymphadenopathy:    She has no cervical adenopathy.  Neurological: She is alert. She has normal reflexes. She displays normal reflexes. No cranial nerve deficit. She exhibits normal muscle tone. Coordination normal.  Skin: Skin is warm and dry. No rash noted. No erythema. No pallor.  Solar lentigines diffusely Some sks  Psychiatric: She has a normal mood and affect.  Pleasant           Assessment & Plan:   Problem List Items Addressed This Visit      Cardiovascular and Mediastinum   Essential hypertension    bp in fair control at this time  BP Readings from Last 1 Encounters:  05/07/18 134/78   No changes needed Most recent labs reviewed  Disc lifstyle change with low sodium diet and exercise        Relevant Medications   amLODipine-benazepril (LOTREL)  5-10 MG capsule   rosuvastatin (CRESTOR) 10 MG tablet     Digestive   ADENOMATOUS COLONIC POLYP    Due for recall colonoscopy  Ref done         Relevant Orders   Ambulatory referral to Gastroenterology     Endocrine   Subclinical hyperthyroidism    Lab Results  Component Value Date   TSH 0.47 05/02/2018    No clinical changes Continue to watch        Musculoskeletal and Integument   Osteopenia    dexa ordered for f/u  No falls or fx Taking ca and D Enc exercise         Genitourinary   Pelvic prolapse    Cystocele on exam today (also has overactive bladder and some urinary incontinence)  Past partial hysterectomy occ c/o pelvic discomfort and pressure req gyn ref - that was done        Relevant Orders   Ambulatory referral to Gynecology     Other   Estrogen deficiency   Relevant Orders   DG Bone Density   Frequent urination    UA clear today  Has overactive bladder and cystocele      Relevant Orders   POCT Urinalysis Dipstick (Automated) (Completed)   Hyperlipidemia    Disc goals for lipids and reasons to control them Rev last labs with pt Rev low sat fat diet in detail Well controled with crestor and diet       Relevant Medications   amLODipine-benazepril (LOTREL) 5-10 MG capsule   rosuvastatin (CRESTOR) 10 MG tablet   Routine general medical examination at a health care facility - Primary    Reviewed health habits including diet and exercise and skin cancer prevention Reviewed appropriate screening tests for age  Also reviewed health mt list, fam hx and immunization status , as well as social and family history   See HPI Labs rev amw rev  Colonoscopy and dexa ref done  ua clear Overall good self care        Other Visit Diagnoses    Need for influenza vaccination       Relevant Orders   Flu vaccine HIGH DOSE PF (Fluzone High dose) (Completed)

## 2018-05-07 NOTE — Assessment & Plan Note (Signed)
Lab Results  Component Value Date   TSH 0.47 05/02/2018    No clinical changes Continue to watch

## 2018-05-07 NOTE — Assessment & Plan Note (Signed)
UA clear today  Has overactive bladder and cystocele

## 2018-06-12 ENCOUNTER — Ambulatory Visit (AMBULATORY_SURGERY_CENTER): Payer: Self-pay

## 2018-06-12 ENCOUNTER — Ambulatory Visit (INDEPENDENT_AMBULATORY_CARE_PROVIDER_SITE_OTHER)
Admission: RE | Admit: 2018-06-12 | Discharge: 2018-06-12 | Disposition: A | Discharge status: Home / Self Care | Payer: Medicare Other | Source: Ambulatory Visit | Attending: Family Medicine | Admitting: Family Medicine

## 2018-06-12 ENCOUNTER — Encounter: Payer: Self-pay | Admitting: Gastroenterology

## 2018-06-12 VITALS — Ht 61.0 in | Wt 145.4 lb

## 2018-06-12 DIAGNOSIS — E2839 Other primary ovarian failure: Secondary | ICD-10-CM

## 2018-06-12 DIAGNOSIS — Z800 Family history of malignant neoplasm of digestive organs: Secondary | ICD-10-CM

## 2018-06-12 DIAGNOSIS — Z86010 Personal history of colonic polyps: Secondary | ICD-10-CM

## 2018-06-12 MED ORDER — NA SULFATE-K SULFATE-MG SULF 17.5-3.13-1.6 GM/177ML PO SOLN: 1.0000 | Freq: Once | ORAL | 0 refills | Status: AC

## 2018-06-12 NOTE — Progress Notes (Signed)
No egg or soy allergy known to patient  No issues with past sedation with any surgeries  or procedures, no intubation problems  No diet pills per patient No home 02 use per patient  No blood thinners per patient  Pt denies issues with constipation  No A fib or A flutter  EMMI video sent to pt's e mail - declined   

## 2018-06-14 ENCOUNTER — Encounter: Payer: Self-pay | Admitting: *Deleted

## 2018-06-19 ENCOUNTER — Telehealth: Payer: Self-pay | Admitting: Family Medicine

## 2018-06-19 NOTE — Telephone Encounter (Signed)
I looked at her note That is fine Let me know if she needs a referral

## 2018-06-19 NOTE — Telephone Encounter (Signed)
Patient is calling because she has an appointment tomorrow  06/20/18 at 10:00am with Dr Clovia Cuff . She called their office and they recommended she go back to see Dr Matilde Sprang. She is cancelling the GYN Appt. She is going to call Dr MacDiarmids office to try to make her own appointment but if she has trouble or they say she needs a Referral, she will call me back and will request one.

## 2018-06-20 ENCOUNTER — Encounter: Payer: Medicare Other | Admitting: Obstetrics & Gynecology

## 2018-06-25 ENCOUNTER — Encounter: Payer: Medicare Other | Admitting: Obstetrics & Gynecology

## 2018-06-26 ENCOUNTER — Ambulatory Visit (AMBULATORY_SURGERY_CENTER): Payer: Medicare Other | Admitting: Gastroenterology

## 2018-06-26 ENCOUNTER — Encounter: Payer: Self-pay | Admitting: Gastroenterology

## 2018-06-26 VITALS — BP 111/54 | HR 50 | Temp 96.2°F | Resp 11 | Ht 61.0 in | Wt 145.0 lb

## 2018-06-26 DIAGNOSIS — D122 Benign neoplasm of ascending colon: Secondary | ICD-10-CM | POA: Diagnosis not present

## 2018-06-26 DIAGNOSIS — Z1211 Encounter for screening for malignant neoplasm of colon: Secondary | ICD-10-CM | POA: Diagnosis present

## 2018-06-26 DIAGNOSIS — Z800 Family history of malignant neoplasm of digestive organs: Secondary | ICD-10-CM

## 2018-06-26 MED ORDER — SODIUM CHLORIDE 0.9 % IV SOLN: 500.0000 mL | Freq: Once | INTRAVENOUS | Status: DC

## 2018-06-26 NOTE — Progress Notes (Signed)
Called to room to assist during endoscopic procedure.  Patient ID and intended procedure confirmed with present staff. Received instructions for my participation in the procedure from the performing physician.  

## 2018-06-26 NOTE — Progress Notes (Signed)
Pt's states no medical or surgical changes since previsit or office visit. 

## 2018-06-26 NOTE — Op Note (Signed)
[facility] Patient Name: Deborah Gardner Procedure Date: 06/26/2018 10:48 AM MRN: [id number] Endoscopist: Mallie Mussel L. Loletha Carrow , MD Age: 72 Referring MD:  Date of Birth: Aug 17, 1945 Gender: Female Account #: [id number] Procedure:                Colonoscopy Indications:              Screening in patient at increased risk: Colorectal                            cancer in father 42 or older Medicines:                Monitored Anesthesia Care Procedure:                Pre-Anesthesia Assessment:                           - Prior to the procedure, a History and Physical                            was performed, and patient medications and                            allergies were reviewed. The patient's tolerance of                            previous anesthesia was also reviewed. The risks                            and benefits of the procedure and the sedation                            options and risks were discussed with the patient.                            All questions were answered, and informed consent                            was obtained. Anticoagulants: The patient has taken                            aspirin. It was decided not to withhold this                            medication prior to the procedure. ASA Grade                            Assessment: II - A patient with mild systemic                            disease. After reviewing the risks and benefits,                            the patient was deemed in satisfactory condition to  undergo the procedure.                           After obtaining informed consent, the colonoscope                            was passed under direct vision. Throughout the                            procedure, the patient's blood pressure, pulse, and                            oxygen saturations were monitored continuously. The                            Model [id number]-[id number]) scope was introduced                           through the anus and advanced to the the cecum,                            identified by appendiceal orifice and ileocecal                            valve. The colonoscopy was performed without                            difficulty. The patient tolerated the procedure                            well. The quality of the bowel preparation was                            good. The ileocecal valve, appendiceal orifice, and                            rectum were photographed. Scope In: 11:02:56 AM Scope Out: 11:15:57 AM Scope Withdrawal Time: 0 hours 9 minutes 46 seconds  Total Procedure Duration: 0 hours 13 minutes 1 second  Findings:                 The digital rectal exam findings include decreased                            sphincter tone.                           A 4 mm polyp was found in the ascending colon. The                            polyp was sessile. The polyp was removed with a                            cold snare. Resection and retrieval were complete.  Retroflexion in the rectum was not performed due to                            anatomy.                           The exam was otherwise without abnormality. Complications:            No immediate complications. Estimated Blood Loss:     Estimated blood loss was minimal. Impression:               - Decreased sphincter tone found on digital rectal                            exam.                           - One 4 mm polyp in the ascending colon, removed                            with a cold snare. Resected and retrieved.                           - The examination was otherwise normal. Recommendation:           - Patient has a contact number available for                            emergencies. The signs and symptoms of potential                            delayed complications were discussed with the                            patient. Return to normal activities tomorrow.                             Written discharge instructions were provided to the                            patient.                           - Resume previous diet.                           - Continue present medications.                           - Await pathology results.                           - Repeat colonoscopy in 5 years for surveillance. Brendolyn Stockley L. Loletha Carrow, MD 06/26/2018 11:25:03 AM This report has been signed electronically.

## 2018-06-26 NOTE — Progress Notes (Signed)
Pt awake VSS report to RN no anesthetic complications noted

## 2018-06-26 NOTE — Patient Instructions (Signed)
  Information on polyps given to you today.  Await pathology results.  Repeat colonoscopy in five years.  YOU HAD AN ENDOSCOPIC PROCEDURE TODAY AT [facility]:   Refer to the procedure report that was given to you for any specific questions about what was found during the examination.  If the procedure report does not answer your questions, please call your gastroenterologist to clarify.  If you requested that your care partner not be given the details of your procedure findings, then the procedure report has been included in a sealed envelope for you to review at your convenience later.  YOU SHOULD EXPECT: Some feelings of bloating in the abdomen. Passage of more gas than usual.  Walking can help get rid of the air that was put into your GI tract during the procedure and reduce the bloating. If you had a lower endoscopy (such as a colonoscopy or flexible sigmoidoscopy) you may notice spotting of blood in your stool or on the toilet paper. If you underwent a bowel prep for your procedure, you may not have a normal bowel movement for a few days.  Please Note:  You might notice some irritation and congestion in your nose or some drainage.  This is from the oxygen used during your procedure.  There is no need for concern and it should clear up in a day or so.  SYMPTOMS TO REPORT IMMEDIATELY:   Following lower endoscopy (colonoscopy or flexible sigmoidoscopy):  Excessive amounts of blood in the stool  Significant tenderness or worsening of abdominal pains  Swelling of the abdomen that is new, acute  Fever of 100F or higher   For urgent or emergent issues, a gastroenterologist can be reached at any hour by calling [phone].   DIET:  We do recommend a small meal at first, but then you may proceed to your regular diet.  Drink plenty of fluids but you should avoid alcoholic beverages for 24 hours.  ACTIVITY:  You should plan to take it easy for the rest of today and you  should NOT DRIVE or use heavy machinery until tomorrow (because of the sedation medicines used during the test).    FOLLOW UP: Our staff will call the number listed on your records the next business day following your procedure to check on you and address any questions or concerns that you may have regarding the information given to you following your procedure. If we do not reach you, we will leave a message.  However, if you are feeling well and you are not experiencing any problems, there is no need to return our call.  We will assume that you have returned to your regular daily activities without incident.  If any biopsies were taken you will be contacted by phone or by letter within the next 1-3 weeks.  Please call us at [phone] if you have not heard about the biopsies in 3 weeks.    SIGNATURES/CONFIDENTIALITY: You and/or your care partner have signed paperwork which will be entered into your electronic medical record.  These signatures attest to the fact that that the information above on your After Visit Summary has been reviewed and is understood.  Full responsibility of the confidentiality of this discharge information lies with you and/or your care-partner.

## 2018-06-27 ENCOUNTER — Telehealth: Payer: Self-pay | Admitting: *Deleted

## 2018-06-27 NOTE — Telephone Encounter (Signed)
No answer. No identifier. Message left to call if any questions or concerns.

## 2018-07-08 ENCOUNTER — Ambulatory Visit: Payer: Medicare Other | Admitting: Family Medicine

## 2018-07-08 ENCOUNTER — Encounter: Payer: Self-pay | Admitting: Family Medicine

## 2018-07-08 VITALS — BP 136/82 | HR 60 | Temp 98.5°F | Ht 61.0 in | Wt 149.5 lb

## 2018-07-08 DIAGNOSIS — J0110 Acute frontal sinusitis, unspecified: Secondary | ICD-10-CM

## 2018-07-08 DIAGNOSIS — K2100 Gastro-esophageal reflux disease with esophagitis, without bleeding: Secondary | ICD-10-CM

## 2018-07-08 DIAGNOSIS — M85852 Other specified disorders of bone density and structure, left thigh: Secondary | ICD-10-CM | POA: Diagnosis not present

## 2018-07-08 DIAGNOSIS — M85851 Other specified disorders of bone density and structure, right thigh: Secondary | ICD-10-CM | POA: Diagnosis not present

## 2018-07-08 DIAGNOSIS — J019 Acute sinusitis, unspecified: Secondary | ICD-10-CM | POA: Insufficient documentation

## 2018-07-08 MED ORDER — AMOXICILLIN 500 MG PO CAPS: 500.0000 mg | ORAL_CAPSULE | Freq: Three times a day (TID) | ORAL | 0 refills | Status: DC

## 2018-07-08 NOTE — Patient Instructions (Addendum)
Try taking your prilosec in the evening (empty stomach is probably best)  If you need pepcid let us know   Take 2000-4000 iu of vitamin D daily   Weight bearing exercise helps your bones (fracture risk), as well as joint pain  Low impact activities like walking or an exercise bike are great  Also weight Terrence Dupont training (weights or exercise bands or machines)  Work a plan to exercise 5 times per week   If you want to try Evista (Raloxifene) let me know  Here is a handout  We will re check it in 2 years   Be very careful not to fall   Take amoxicillin for sinus infection  Continue flonase and zyrtec if needed  If you feel congested you can also use saline nasal spray over the counter Update if not starting to improve in a week or if worsening

## 2018-07-08 NOTE — Assessment & Plan Note (Signed)
Zantac was recalled  Taking omeprazole-will try taking in pm as that is when symptoms are worse Will px pepcid if needed Disc diet

## 2018-07-08 NOTE — Assessment & Plan Note (Signed)
Chronic congestion with frontal sinus tenderness and pnd  Px amoxicillin  Symptomatic care-flonase/zyrtec/nasal saline prn  Update if not starting to improve in a week or if worsening

## 2018-07-08 NOTE — Progress Notes (Signed)
Subjective:    Patient ID: Deborah Gardner, female    DOB: 28-Nov-1945, 72 y.o.   MRN: [id number]  HPI  Here for rev of dexa  Also sinus symptoms   Needs alt med from zantac (recalled) as well She takes prilosec daily  The zantac helps in the evening  Would like to change prilosec to pm before she adds another H2   Has done fairly well with loss of husb in the summer     Wt Readings from Last 3 Encounters:  07/08/18 149 lb 8 oz (67.8 kg)  06/26/18 145 lb (65.8 kg)  06/12/18 145 lb 6.4 oz (66 kg)   28.25 kg/m   Dexa 12/4 LS T score-0.2 RFN-2.0 LFN-2.2 No fractures in adulthood  Taking ca and D  frax 14.2% major fx risk and hip fx risk 3.9% for 10 years  Mother and GM had OP  She takes vitamin D3 (alone and with ca)   Takes ppi   occ greater trochanter pain  Stiff after inactivity -hips also   Some walking for exercise   Also having some sinus problems  Nauseated in am from pnd  Mild headaches -forehead  Nasal d/c - L side is worse- mucous ? Color (does not look at it) No fever  Symptoms since allergy season  Ears occ feel plugged (flonase helps)  No cough    Patient Active Problem List   Diagnosis Date Noted  . Acute sinusitis 07/08/2018  . Estrogen deficiency 05/07/2018  . Pelvic prolapse 05/07/2018  . Frequent urination 05/07/2018  . Memory change 11/03/2016  . Osteoarthritis of right knee 05/22/2016  . Routine general medical examination at a health care facility 12/02/2014  . Encounter for Medicare annual wellness exam 11/21/2013  . Anxiety 11/21/2013  . Subclinical hyperthyroidism 06/18/2012  . Pruritus ani 03/08/2011  . UNSPECIFIED ESOPHAGITIS 08/12/2010  . OSTEOARTHRITIS, GENERALIZED, HAND 03/21/2010  . 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 ROTATOR CUFF SYNDROME, LEFT 03/21/2010  . ALLERGIC RHINITIS 11/03/2008  . ARTHRALGIA 10/08/2008  . ADENOMATOUS COLONIC POLYP 08/19/2007  . ESOPHAGITIS, REFLUX 08/19/2007  . HIATAL HERNIA 08/19/2007    . Hyperlipidemia 03/05/2007  . G E R D 03/05/2007  . Osteopenia 03/05/2007  . Essential hypertension 02/08/2007  . IBS 02/08/2007  . FIBROCYSTIC BREAST DISEASE 02/08/2007  . OSTEOARTHRITIS 02/08/2007  . [place] DISEASE, LUMBAR 02/08/2007  . STRESS INCONTINENCE 02/08/2007   Past Medical History:  Diagnosis Date  . Allergic rhinitis   . Allergy   . Arthritis   . Cataract    bil  . Colon polyps   . Esophagitis   . GERD (gastroesophageal reflux disease)   . Glaucoma   . HH (hiatus hernia)   . HTN (hypertension)   . Hyperlipidemia   . Increased pressure in the eye   . OA (osteoarthritis)    hands  . Osteoporosis   . Rectal pain    Past Surgical History:  Procedure Laterality Date  . ABDOMINAL HYSTERECTOMY     still has ovaries  . APPENDECTOMY    . BREAST CYST ASPIRATION  1/03  . CATARACT EXTRACTION  9/07  . ESOPHAGOGASTRODUODENOSCOPY  11/08  . HEMORRHOID SURGERY    . rectal sphincterotomy    . Retinal laser surgery    . UPPER GASTROINTESTINAL ENDOSCOPY     Social History   Tobacco Use  . Smoking status: Never Smoker  . Smokeless tobacco: Never Used  Substance Use Topics  . Alcohol use: No    Alcohol/week:  0.0 standard drinks  . Drug use: No   Family History  Problem Relation Age of Onset  . Colon cancer Father   . Pneumonia Father   . Thyroid disease Mother   . Depression Mother   . Hypertension Mother   . Osteoporosis Mother   . Liver disease Brother        from alcohol  . Breast cancer Paternal Aunt   . Alcohol abuse Brother        terminal   . Alcohol abuse Brother   . Thyroid disease Other        half sister  . Colon cancer Other        Aunt  . Breast cancer Maternal Aunt   . Breast cancer Paternal Aunt   . Colon polyps Neg Hx   . Esophageal cancer Neg Hx   . Stomach cancer Neg Hx   . Rectal cancer Neg Hx    Allergies  Allergen Reactions  . Atorvastatin Other (See Comments)    Memory loss   Current Outpatient Medications on File Prior  to Visit  Medication Sig Dispense Refill  . ALPRAZolam (XANAX) 0.5 MG tablet Take 1 tablet (0.5 mg total) by mouth daily as needed. 30 tablet 3  . amLODipine-benazepril (LOTREL) 5-10 MG capsule TAKE 1 CAPSULE BY MOUTH EVERY DAY 90 capsule 3  . aspirin 81 MG tablet Take 81 mg by mouth daily.    . Calcium Carb-Cholecalciferol (CALCIUM 600 + D PO) Take 1 tablet by mouth daily.    . Cholecalciferol (VITAMIN D) 1000 UNITS capsule Take 1,000 Units by mouth daily.     Marland Kitchen escitalopram (LEXAPRO) 10 MG tablet Take 1 tablet (10 mg total) by mouth daily. 90 tablet 3  . fluticasone (FLONASE) 50 MCG/ACT nasal spray PLACE 2 SPRAYS INTO THE NOSE DAILY. (Patient taking differently: as needed. PLACE 2 SPRAYS INTO THE NOSE DAILY.) 16 g 11  . MELATONIN PO Take 5 mg by mouth as needed.    Marland Kitchen omeprazole (PRILOSEC) 20 MG capsule TAKE ONE CAPSULE BY MOUTH once a DAY BEFORE A MEAL 90 capsule 3  . rosuvastatin (CRESTOR) 10 MG tablet Take 1 tablet (10 mg total) by mouth daily. 90 tablet 3  . timolol (TIMOPTIC) 0.5 % ophthalmic solution Place 1 drop into both eyes 2 (two) times daily.      No current facility-administered medications on file prior to visit.     Review of Systems  Constitutional: Positive for appetite change. Negative for fatigue and fever.  HENT: Positive for congestion, ear pain, postnasal drip, rhinorrhea, sinus pressure and sore throat. Negative for nosebleeds.   Eyes: Negative for pain, redness and itching.  Respiratory: Positive for cough. Negative for shortness of breath and wheezing.   Cardiovascular: Negative for chest pain.  Gastrointestinal: Negative for abdominal pain, diarrhea, nausea and vomiting.  Endocrine: Negative for polyuria.  Genitourinary: Negative for dysuria, frequency and urgency.  Musculoskeletal: Negative for arthralgias and myalgias.  Allergic/Immunologic: Negative for immunocompromised state.  Neurological: Positive for headaches. Negative for dizziness, tremors, syncope,  weakness and numbness.  Hematological: Negative for adenopathy. Does not bruise/bleed easily.  Psychiatric/Behavioral: Negative for dysphoric mood. The patient is not nervous/anxious.        Objective:   Physical Exam Constitutional:      General: She is not in acute distress.    Appearance: She is well-developed and normal weight.  HENT:     Head: Normocephalic and atraumatic.     Comments: Bilateral maxillary sinus  tenderness  Nares are injected and congested  Scant clear pnd    Right Ear: Tympanic membrane, ear canal and external ear normal.     Left Ear: Tympanic membrane, ear canal and external ear normal.     Ears:     Comments: R TM is dull    Mouth/Throat:     Mouth: Mucous membranes are moist.     Pharynx: No oropharyngeal exudate.  Eyes:     General:        Right eye: No discharge.        Left eye: No discharge.     Conjunctiva/sclera: Conjunctivae normal.     Pupils: Pupils are equal, round, and reactive to light.  Neck:     Musculoskeletal: Normal range of motion and neck supple.  Cardiovascular:     Rate and Rhythm: Normal rate and regular rhythm.  Pulmonary:     Effort: Pulmonary effort is normal. No respiratory distress.     Breath sounds: Normal breath sounds. No wheezing, rhonchi or rales.  Musculoskeletal:     Comments: Scoliosis  Mild kyphosis from past trauma  Lymphadenopathy:     Cervical: No cervical adenopathy.  Skin:    General: Skin is warm and dry.     Capillary Refill: Capillary refill takes less than 2 seconds.     Coloration: Skin is not pale.     Findings: No rash.  Neurological:     General: No focal deficit present.     Mental Status: She is alert.     Cranial Nerves: No cranial nerve deficit.  Psychiatric:        Mood and Affect: Mood normal.           Assessment & Plan:   Problem List Items Addressed This Visit      Respiratory   Acute sinusitis    Chronic congestion with frontal sinus tenderness and pnd  Px  amoxicillin  Symptomatic care-flonase/zyrtec/nasal saline prn  Update if not starting to improve in a week or if worsening        Relevant Medications   amoxicillin (AMOXIL) 500 MG capsule     Digestive   G E R D    Zantac was recalled  Taking omeprazole-will try taking in pm as that is when symptoms are worse Will px pepcid if needed Disc diet         Musculoskeletal and Integument   Osteopenia - Primary    FN T score down to -2.2  frax 3.9% for hip fx in 10 y  Disc this  Safety-fall prev Ca and D Plan made for exercise  Would not px oral bisphosphonate due to hx of esophagitis Disc evista and info give Pt thinks she will hold off for now Plan dexa 2 y

## 2018-07-08 NOTE — Assessment & Plan Note (Signed)
FN T score down to -2.2  frax 3.9% for hip fx in 10 y  Disc this  Safety-fall prev Ca and D Plan made for exercise  Would not px oral bisphosphonate due to hx of esophagitis Disc evista and info give Pt thinks she will hold off for now Plan dexa 2 y

## 2018-07-11 ENCOUNTER — Encounter: Payer: Self-pay | Admitting: Gastroenterology

## 2018-09-10 ENCOUNTER — Encounter: Payer: Self-pay | Admitting: Family Medicine

## 2018-09-10 ENCOUNTER — Telehealth: Payer: Self-pay

## 2018-09-10 ENCOUNTER — Ambulatory Visit: Payer: Medicare Other | Admitting: Family Medicine

## 2018-09-10 VITALS — BP 110/68 | HR 56 | Temp 98.7°F | Ht 61.0 in | Wt 149.1 lb

## 2018-09-10 DIAGNOSIS — F419 Anxiety disorder, unspecified: Secondary | ICD-10-CM

## 2018-09-10 DIAGNOSIS — N3281 Overactive bladder: Secondary | ICD-10-CM

## 2018-09-10 DIAGNOSIS — J0100 Acute maxillary sinusitis, unspecified: Secondary | ICD-10-CM

## 2018-09-10 DIAGNOSIS — J019 Acute sinusitis, unspecified: Secondary | ICD-10-CM | POA: Insufficient documentation

## 2018-09-10 MED ORDER — MAGIC MOUTHWASH: 5.0000 mL | Freq: Three times a day (TID) | ORAL | 0 refills | Status: DC | PRN

## 2018-09-10 MED ORDER — AMOXICILLIN-POT CLAVULANATE 875-125 MG PO TABS: 1.0000 | ORAL_TABLET | Freq: Two times a day (BID) | ORAL | 0 refills | Status: DC

## 2018-09-10 NOTE — Telephone Encounter (Signed)
[facility] request cb with formulation for magic mouthwash. Do you want standard formulation or special order. [facility] request cb.

## 2018-09-10 NOTE — Patient Instructions (Addendum)
I think you have a sinus infection  Get back to using flonase daily   If the urinary medicines do not help-stay off of them  Take the augmentin as needed  Nasal saline spray time you want  Breathe steam   Tylenol is fine  Try the magic mouthwash -swish/gargle and spit for mouth and throat discomfort   Update if not starting to improve in a week or if worsening    You can come off lexapro if you feel you don't need it   Take care of yourself

## 2018-09-10 NOTE — Assessment & Plan Note (Signed)
She is holding off medications for now given dry mouth side effect  I do not think this caused her ST however

## 2018-09-10 NOTE — Progress Notes (Signed)
Subjective:    Patient ID: Deborah Gardner, female    DOB: Mar 25, 1946, 73 y.o.   MRN: [id number]  HPI  Here with c/o ST- unsure if it may be thrush  Also started a new urinary med   On Sunday had some sinus problems   ST on L side  Tongue felt swollen-had bitten  Yesterday and today  L ear is stopped up  Has not felt great for a few week  A little cough -on and off (drainge) -non productive  Not clearing through   Headache -worse on L side around eye and nose  Not a lot out of nose  Is congested     Overactive bladder  Has been on several medications  Was on detrol - now held it for 3 days  oxybutinin  mybetriq   No recent abx  ? Coating on tongue yesterday    Wt Readings from Last 3 Encounters:  09/10/18 149 lb 1 oz (67.6 kg)  07/08/18 149 lb 8 oz (67.8 kg)  06/26/18 145 lb (65.8 kg)   28.17 kg/m   She does have hx of GERD with esophagitis  Taking omeoprazole 20 mg Better overall - no need for anything in addition   Is interested in coming off lexapro Does not think she needs it any more   Patient Active Problem List   Diagnosis Date Noted  . Acute sinusitis 09/10/2018  . Estrogen deficiency 05/07/2018  . Pelvic prolapse 05/07/2018  . Overactive bladder 05/07/2018  . Memory change 11/03/2016  . Osteoarthritis of right knee 05/22/2016  . Routine general medical examination at a health care facility 12/02/2014  . Encounter for Medicare annual wellness exam 11/21/2013  . Anxiety 11/21/2013  . Subclinical hyperthyroidism 06/18/2012  . Pruritus ani 03/08/2011  . UNSPECIFIED ESOPHAGITIS 08/12/2010  . OSTEOARTHRITIS, GENERALIZED, HAND 03/21/2010  . 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 ROTATOR CUFF SYNDROME, LEFT 03/21/2010  . ALLERGIC RHINITIS 11/03/2008  . ARTHRALGIA 10/08/2008  . ADENOMATOUS COLONIC POLYP 08/19/2007  . ESOPHAGITIS, REFLUX 08/19/2007  . HIATAL HERNIA 08/19/2007  . Hyperlipidemia 03/05/2007  . G E R D 03/05/2007  . Osteopenia  03/05/2007  . Essential hypertension 02/08/2007  . IBS 02/08/2007  . FIBROCYSTIC BREAST DISEASE 02/08/2007  . OSTEOARTHRITIS 02/08/2007  . [place] DISEASE, LUMBAR 02/08/2007  . STRESS INCONTINENCE 02/08/2007   Past Medical History:  Diagnosis Date  . Allergic rhinitis   . Allergy   . Arthritis   . Cataract    bil  . Colon polyps   . Esophagitis   . GERD (gastroesophageal reflux disease)   . Glaucoma   . HH (hiatus hernia)   . HTN (hypertension)   . Hyperlipidemia   . Increased pressure in the eye   . OA (osteoarthritis)    hands  . Osteoporosis   . Rectal pain    Past Surgical History:  Procedure Laterality Date  . ABDOMINAL HYSTERECTOMY     still has ovaries  . APPENDECTOMY    . BREAST CYST ASPIRATION  1/03  . CATARACT EXTRACTION  9/07  . ESOPHAGOGASTRODUODENOSCOPY  11/08  . HEMORRHOID SURGERY    . rectal sphincterotomy    . Retinal laser surgery    . UPPER GASTROINTESTINAL ENDOSCOPY     Social History   Tobacco Use  . Smoking status: Never Smoker  . Smokeless tobacco: Never Used  Substance Use Topics  . Alcohol use: No    Alcohol/week: 0.0 standard drinks  . Drug use: No  Family History  Problem Relation Age of Onset  . Colon cancer Father   . Pneumonia Father   . Thyroid disease Mother   . Depression Mother   . Hypertension Mother   . Osteoporosis Mother   . Liver disease Brother        from alcohol  . Breast cancer Paternal Aunt   . Alcohol abuse Brother        terminal   . Alcohol abuse Brother   . Thyroid disease Other        half sister  . Colon cancer Other        Aunt  . Breast cancer Maternal Aunt   . Breast cancer Paternal Aunt   . Colon polyps Neg Hx   . Esophageal cancer Neg Hx   . Stomach cancer Neg Hx   . Rectal cancer Neg Hx    Allergies  Allergen Reactions  . Atorvastatin Other (See Comments)    Memory loss   Current Outpatient Medications on File Prior to Visit  Medication Sig Dispense Refill  . amLODipine-benazepril  (LOTREL) 5-10 MG capsule TAKE 1 CAPSULE BY MOUTH EVERY DAY 90 capsule 3  . aspirin 81 MG tablet Take 81 mg by mouth daily.    . Calcium Carb-Cholecalciferol (CALCIUM 600 + D PO) Take 1 tablet by mouth daily.    . Cholecalciferol (VITAMIN D) 1000 UNITS capsule Take 1,000 Units by mouth daily.     Marland Kitchen escitalopram (LEXAPRO) 10 MG tablet Take 1 tablet (10 mg total) by mouth daily. 90 tablet 3  . fluticasone (FLONASE) 50 MCG/ACT nasal spray PLACE 2 SPRAYS INTO THE NOSE DAILY. (Patient taking differently: as needed. PLACE 2 SPRAYS INTO THE NOSE DAILY.) 16 g 11  . omeprazole (PRILOSEC) 20 MG capsule TAKE ONE CAPSULE BY MOUTH once a DAY BEFORE A MEAL 90 capsule 3  . rosuvastatin (CRESTOR) 10 MG tablet Take 1 tablet (10 mg total) by mouth daily. 90 tablet 3  . timolol (TIMOPTIC) 0.5 % ophthalmic solution Place 1 drop into both eyes 2 (two) times daily.      No current facility-administered medications on file prior to visit.     Review of Systems  Constitutional: Negative for activity change, appetite change, fatigue, fever and unexpected weight change.  HENT: Positive for congestion, ear pain, postnasal drip, sinus pressure, sinus pain and sore throat. Negative for ear discharge, facial swelling, rhinorrhea, trouble swallowing and voice change.   Eyes: Negative for pain, redness and visual disturbance.  Respiratory: Positive for cough. Negative for chest tightness, shortness of breath and wheezing.   Cardiovascular: Negative for chest pain and palpitations.  Gastrointestinal: Negative for abdominal pain, blood in stool, constipation and diarrhea.  Endocrine: Negative for polydipsia and polyuria.  Genitourinary: Negative for dysuria, frequency and urgency.  Musculoskeletal: Negative for arthralgias, back pain and myalgias.  Skin: Negative for pallor and rash.  Allergic/Immunologic: Negative for environmental allergies.  Neurological: Negative for dizziness, syncope, facial asymmetry, numbness and  headaches.  Hematological: Negative for adenopathy. Does not bruise/bleed easily.  Psychiatric/Behavioral: Negative for decreased concentration and dysphoric mood. The patient is not nervous/anxious.        Objective:   Physical Exam Constitutional:      General: She is not in acute distress.    Appearance: Normal appearance. She is well-developed and normal weight. She is not ill-appearing.  HENT:     Head: Normocephalic and atraumatic.     Comments: L maxillary and frontal sinus tenderness No temporal tenderness  Right Ear: Tympanic membrane and external ear normal.     Left Ear: Tympanic membrane and external ear normal.     Ears:     Comments: L TM is more dull than R one     Nose: Congestion and rhinorrhea present.     Mouth/Throat:     Pharynx: Oropharynx is clear. No oropharyngeal exudate or posterior oropharyngeal erythema.     Comments: Clear pnd Mild posterior injection  No evidence of thrush No swelling or exudate  Eyes:     General:        Right eye: No discharge.        Left eye: No discharge.     Conjunctiva/sclera: Conjunctivae normal.     Pupils: Pupils are equal, round, and reactive to light.  Neck:     Musculoskeletal: Normal range of motion and neck supple.  Cardiovascular:     Rate and Rhythm: Regular rhythm. Bradycardia present.  Pulmonary:     Effort: Pulmonary effort is normal. No respiratory distress.     Breath sounds: Normal breath sounds. No wheezing or rales.     Comments: Good air exch No rales or rhonchi Lymphadenopathy:     Cervical: No cervical adenopathy.  Skin:    General: Skin is warm and dry.     Findings: No erythema, lesion or rash.  Neurological:     Mental Status: She is alert. Mental status is at baseline.     Cranial Nerves: No cranial nerve deficit.     Coordination: Coordination normal.     Deep Tendon Reflexes: Reflexes normal.  Psychiatric:        Mood and Affect: Mood normal.     Comments: Good mood today             Assessment & Plan:   Problem List Items Addressed This Visit      Respiratory   Acute sinusitis - Primary    Suspect this is cause of L sided facial pain /congestion/ear pain and ST  Cover with augmentin  sympt care discussed  For ST-also px magic mouthwash Update if not starting to improve in a week or if worsening        Relevant Medications   amoxicillin-clavulanate (AUGMENTIN) 875-125 MG tablet   magic mouthwash SOLN     Genitourinary   Overactive bladder    She is holding off medications for now given dry mouth side effect  I do not think this caused her ST however        Other   Anxiety    Pt feels she is ready to come off lexapro -doing well  Since she is on the lowest dose I told her she can just stop it  Discussed expectations  She will alert [place] if any problems or big mood change

## 2018-09-10 NOTE — Assessment & Plan Note (Signed)
Pt feels she is ready to come off lexapro -doing well  Since she is on the lowest dose I told her she can just stop it  Discussed expectations  She will alert [place] if any problems or big mood change

## 2018-09-10 NOTE — Assessment & Plan Note (Signed)
Suspect this is cause of L sided facial pain /congestion/ear pain and ST  Cover with augmentin  sympt care discussed  For ST-also px magic mouthwash Update if not starting to improve in a week or if worsening

## 2018-09-10 NOTE — Telephone Encounter (Signed)
Left VM letting pharmacy know standard formulation is fine with Dr. Glori Bickers

## 2018-09-10 NOTE — Telephone Encounter (Signed)
Standard formulation is fine thanks

## 2018-09-16 ENCOUNTER — Telehealth: Payer: Self-pay

## 2018-09-16 ENCOUNTER — Other Ambulatory Visit: Payer: Self-pay | Admitting: Family Medicine

## 2018-09-16 NOTE — Telephone Encounter (Signed)
Pt notified of Dr. Tower's instructions and verbalized understanding  

## 2018-09-16 NOTE — Telephone Encounter (Signed)
Increase flonase to bid for the next week (instead of daily) Then return to daily  Continue other medicines  Let me know if no further improvement or if worse

## 2018-09-16 NOTE — Telephone Encounter (Signed)
Pt advised of Dr. Marliss Coots comments. Pt did ask can she also take sudafed to help with her sxs and if not what OTC meds can Dr. Glori Bickers suggest, she will also increase the flonase BID as Dr. Glori Bickers advised

## 2018-09-16 NOTE — Telephone Encounter (Signed)
Sudafed increases blood pressure and pulse so I recommend avoiding it  Breathe steam Nasal saline   Can use afrin ns for 2-3 days max (can get rebound congestion from it but it does help severe congestion)

## 2018-09-16 NOTE — Telephone Encounter (Signed)
Pt seen 09/10/18. Pt's generally is feeling better; no S/Tand no head congestion. Pt is still feeling off balance like she is swimmy headed when she is up moving around. When she is sitting still no swimmy headedness. Area between eyes is sore.pt has H/A on and off.pt is taking augmentin,and using Flonase. Pt took Zyrtec last night. Pt is not using nasal saline spray but pt said her nose is not stopped up. Pt wants to know what else to do. Pt request cb. [facility].

## 2018-09-25 ENCOUNTER — Telehealth: Payer: Self-pay

## 2018-09-25 NOTE — Telephone Encounter (Signed)
Spoke with pt asking Dr. Marliss Coots questions. States she is not blowing mucous out of her nose since using Flonase BID. Also, takes Zyrtec before bed. Denies any fever, rash or facial swelling. More concerned with left ear pressure, HA and sometimes swimmy headed in the mornings.

## 2018-09-25 NOTE — Telephone Encounter (Signed)
Pt left v/m; pt was seen 09/10/18; pt finished abx 2 days ago. Pt is better but still the lt side of head and ear does not feel right. Pt can pop lt ear but has h/a in AM when wakes up; sinus not hurting now. Pt has "snappy sound" in head for 1 wk. Pt thinks she needs refill of abx or different abx to completely clear symptoms.Please advise. [facility]

## 2018-09-25 NOTE — Telephone Encounter (Signed)
What color mucous is she blowing out of her nose (if any)? Any fever?  Any rash or facial swelling ? How often/times per day does she use flonase

## 2018-09-26 MED ORDER — AMOXICILLIN-POT CLAVULANATE 875-125 MG PO TABS: 1.0000 | ORAL_TABLET | Freq: Two times a day (BID) | ORAL | 0 refills | Status: DC

## 2018-09-26 NOTE — Telephone Encounter (Signed)
Left VM requesting pt to call the office back 

## 2018-09-26 NOTE — Telephone Encounter (Signed)
Please extend (send in ) 5 more days of augmentin Thanks Keep [place] posted

## 2019-01-17 ENCOUNTER — Telehealth: Payer: Self-pay

## 2019-01-17 NOTE — Telephone Encounter (Signed)
I don't see it either  I think she stopped her antidepressant/anxiety med in march- could that have been it?  Is that when symptoms started?   I can't tell much more without seeing her

## 2019-01-17 NOTE — Telephone Encounter (Signed)
Pt said since seen March 3,2020 pt has had problems with her head feeling off balance and lightheaded but room does not spin around. Head "does not feel right";pt has clicking sound in ears and nausea on and off. Pt is not hearing as well out of rt ear. No covid symptoms, no travel and no known exposure to + covid. Pt wants to know if Dr Glori Bickers thinks pt needs to see a specialist. Pt did not want to schedule appt at [facility]. Pt also having problems with her knee bothering her again and wants to know name of ortho pt saw few years ago (2 or more yrs ago) did not see name of ortho in chart. Pt request cb.

## 2019-01-17 NOTE — Telephone Encounter (Signed)
Pt notified of Dr. Marliss Coots comments. Pt wanted to be check out so 30 min appt scheduled for 01/21/19

## 2019-01-21 ENCOUNTER — Encounter: Payer: Self-pay | Admitting: Family Medicine

## 2019-01-21 ENCOUNTER — Other Ambulatory Visit: Payer: Self-pay

## 2019-01-21 ENCOUNTER — Ambulatory Visit: Payer: Medicare Other | Admitting: Family Medicine

## 2019-01-21 VITALS — BP 124/76 | HR 62 | Temp 98.0°F | Ht 61.0 in | Wt 150.5 lb

## 2019-01-21 DIAGNOSIS — M1711 Unilateral primary osteoarthritis, right knee: Secondary | ICD-10-CM | POA: Diagnosis not present

## 2019-01-21 DIAGNOSIS — R0981 Nasal congestion: Secondary | ICD-10-CM | POA: Diagnosis not present

## 2019-01-21 DIAGNOSIS — R42 Dizziness and giddiness: Secondary | ICD-10-CM | POA: Diagnosis not present

## 2019-01-21 NOTE — Progress Notes (Signed)
Subjective:    Patient ID: Deborah Gardner, female    DOB: January 03, 1946, 73 y.o.   MRN: [id number]  HPI Pt present with light headedness  Hearing problem also   Gait problem and R knee pain    Sinuses/allergies and inner ear cause her dizziness and hearing issues Hears a clicking noise - in both ears (comes and goes)  She uses white noise  No pressure or pain in ears and no drainage  Hearing is not as good - sounds like she is in a barrel R ear-has a harder problem with hearing   No runny nose Some congestion in L nostril (can't blow much out) No sinus pain   Dizziness- is off and on  Lightheaded  Unsure if spinning  Makes her feel off balance   flonase -still using qd to bid  Taking zyrtec 10 mg at bedtime    Wt Readings from Last 3 Encounters:  01/21/19 150 lb 8 oz (68.3 kg)  09/10/18 149 lb 1 oz (67.6 kg)  07/08/18 149 lb 8 oz (67.8 kg)   28.44 kg/m   BP Readings from Last 3 Encounters:  01/21/19 124/76  09/10/18 110/68  07/08/18 136/82   Pulse Readings from Last 3 Encounters:  01/21/19 62  09/10/18 (!) 56  07/08/18 60     R knee Known OA  Giving her a lot of trouble  It feels like it jumps our of place at times- when getting up Marcelina Morel when squatting  Last xray of knee DG Knee AP/LAT W/Sunrise Right (Accession [id number]) (Order [id number]) Imaging Date: 05/25/2016 Department: [place] Released By: Ellamae Sia Authorizing: Remiel Corti, Wynelle [name], MD  Exam Information  Status Exam Begun  Exam Ended   Final [99] 05/25/2016 10:07 AM 05/25/2016 10:10 AM  PACS Images  Show images for DG Knee AP/LAT W/Sunrise Right  Study Result  CLINICAL DATA:  Right knee pain and swelling. No known injury. Initial encounter.  EXAM: RIGHT KNEE 3 VIEWS  COMPARISON:  None.  FINDINGS: No acute bony or joint abnormality is seen. Moderate appearing tricompartmental osteoarthritis is noted. No joint effusion. Soft tissues are  unremarkable.  IMPRESSION: No acute abnormality.  Moderate appearing tricompartmental degenerative change.   Electronically Signed   By: Inge Rise M.D   She did see orthopedics  Did not recommend surgery at that time  Had an injection that one time  Takes tylenol arthritis Less often ibuprofen  She is able to walk without a cane however    Patient Active Problem List   Diagnosis Date Noted  . Dizziness 01/21/2019  . Nasal congestion 01/21/2019  . Estrogen deficiency 05/07/2018  . Pelvic prolapse 05/07/2018  . Overactive bladder 05/07/2018  . Memory change 11/03/2016  . Osteoarthritis of right knee 05/22/2016  . Routine general medical examination at a health care facility 12/02/2014  . Encounter for Medicare annual wellness exam 11/21/2013  . Anxiety 11/21/2013  . Subclinical hyperthyroidism 06/18/2012  . Pruritus ani 03/08/2011  . UNSPECIFIED ESOPHAGITIS 08/12/2010  . OSTEOARTHRITIS, GENERALIZED, HAND 03/21/2010  . 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 ROTATOR CUFF SYNDROME, LEFT 03/21/2010  . ALLERGIC RHINITIS 11/03/2008  . ARTHRALGIA 10/08/2008  . ADENOMATOUS COLONIC POLYP 08/19/2007  . ESOPHAGITIS, REFLUX 08/19/2007  . HIATAL HERNIA 08/19/2007  . Hyperlipidemia 03/05/2007  . G E R D 03/05/2007  . Osteopenia 03/05/2007  . Essential hypertension 02/08/2007  . IBS 02/08/2007  . FIBROCYSTIC BREAST DISEASE 02/08/2007  . OSTEOARTHRITIS 02/08/2007  . [place]  DISEASE, LUMBAR 02/08/2007  . STRESS INCONTINENCE 02/08/2007   Past Medical History:  Diagnosis Date  . Allergic rhinitis   . Allergy   . Arthritis   . Cataract    bil  . Colon polyps   . Esophagitis   . GERD (gastroesophageal reflux disease)   . Glaucoma   . HH (hiatus hernia)   . HTN (hypertension)   . Hyperlipidemia   . Increased pressure in the eye   . OA (osteoarthritis)    hands  . Osteoporosis   . Rectal pain    Past Surgical History:  Procedure Laterality Date  . ABDOMINAL  HYSTERECTOMY     still has ovaries  . APPENDECTOMY    . BREAST CYST ASPIRATION  1/03  . CATARACT EXTRACTION  9/07  . ESOPHAGOGASTRODUODENOSCOPY  11/08  . HEMORRHOID SURGERY    . rectal sphincterotomy    . Retinal laser surgery    . UPPER GASTROINTESTINAL ENDOSCOPY     Social History   Tobacco Use  . Smoking status: Never Smoker  . Smokeless tobacco: Never Used  Substance Use Topics  . Alcohol use: No    Alcohol/week: 0.0 standard drinks  . Drug use: No   Family History  Problem Relation Age of Onset  . Colon cancer Father   . Pneumonia Father   . Thyroid disease Mother   . Depression Mother   . Hypertension Mother   . Osteoporosis Mother   . Liver disease Brother        from alcohol  . Breast cancer Paternal Aunt   . Alcohol abuse Brother        terminal   . Alcohol abuse Brother   . Thyroid disease Other        half sister  . Colon cancer Other        Aunt  . Breast cancer Maternal Aunt   . Breast cancer Paternal Aunt   . Colon polyps Neg Hx   . Esophageal cancer Neg Hx   . Stomach cancer Neg Hx   . Rectal cancer Neg Hx    Allergies  Allergen Reactions  . Atorvastatin Other (See Comments)    Memory loss   Current Outpatient Medications on File Prior to Visit  Medication Sig Dispense Refill  . amLODipine-benazepril (LOTREL) 5-10 MG capsule TAKE 1 CAPSULE BY MOUTH EVERY DAY 90 capsule 3  . aspirin 81 MG tablet Take 81 mg by mouth daily.    . Calcium Carb-Cholecalciferol (CALCIUM 600 + D PO) Take 1 tablet by mouth daily.    . Cholecalciferol (VITAMIN D) 1000 UNITS capsule Take 1,000 Units by mouth daily.     . fluticasone (FLONASE) 50 MCG/ACT nasal spray PLACE 2 SPRAYS INTO THE NOSE DAILY. 48 g 1  . omeprazole (PRILOSEC) 20 MG capsule TAKE ONE CAPSULE BY MOUTH once a DAY BEFORE A MEAL 90 capsule 3  . rosuvastatin (CRESTOR) 10 MG tablet Take 1 tablet (10 mg total) by mouth daily. 90 tablet 3  . timolol (TIMOPTIC) 0.5 % ophthalmic solution Place 1 drop into  both eyes 2 (two) times daily.      No current facility-administered medications on file prior to visit.     Review of Systems  Constitutional: Negative for activity change, appetite change, fatigue, fever and unexpected weight change.  HENT: Positive for hearing loss, postnasal drip, sinus pressure and tinnitus. Negative for congestion, ear discharge, ear pain, facial swelling, rhinorrhea, sinus pain, sneezing, sore throat and voice change.  Eyes: Negative for pain, redness and visual disturbance.  Respiratory: Negative for cough, chest tightness, shortness of breath and wheezing.   Cardiovascular: Negative for chest pain, palpitations and leg swelling.  Gastrointestinal: Negative for abdominal pain, blood in stool, constipation and diarrhea.  Endocrine: Negative for polydipsia and polyuria.  Genitourinary: Negative for dysuria, frequency and urgency.  Musculoskeletal: Positive for arthralgias. Negative for back pain and myalgias.  Skin: Negative for pallor and rash.  Allergic/Immunologic: Positive for environmental allergies.  Neurological: Positive for dizziness. Negative for tremors, seizures, syncope, facial asymmetry, speech difficulty, weakness, numbness and headaches.  Hematological: Negative for adenopathy. Does not bruise/bleed easily.  Psychiatric/Behavioral: Negative for decreased concentration and dysphoric mood. The patient is not nervous/anxious.        Objective:   Physical Exam        Assessment & Plan:   Problem List Items Addressed This Visit      Musculoskeletal and Integument   Osteoarthritis of right knee    Tricompartmental More pain lately- /she is active Will f/u with Dr Lorelei Pont to see if a shot may be an option  Wants to avoid surgery as long as possible Enc use of a cane as needed         Other   Dizziness - Primary    With nasal congestion /sinus pressure and hearing loss acute on chronic Bilateral horiz nystagmus on exam today  Using  flonase and zyrted  Ref to ENT for further eval /tx  ? If candidate for therapy for vertigo      Relevant Orders   Ambulatory referral to ENT   Nasal congestion    Worse on L  Dev septum on exam  Causes sinus pressure and dizziness Ref to ENT Continue flonase and zyrtec      Relevant Orders   Ambulatory referral to ENT

## 2019-01-21 NOTE — Assessment & Plan Note (Signed)
Tricompartmental More pain lately- Deborah Gardner is active Will f/u with Dr Lorelei Pont to see if a shot may be an option  Wants to avoid surgery as long as possible Enc use of a cane as needed

## 2019-01-21 NOTE — Patient Instructions (Signed)
The office will call you about an ENT appointment   Stop at check out to get appt. With Dr Lorelei Pont   Continue tylenol  Ice can help  Use a cane if needed   Your right ear had cerumen (wax)  You can get a product for ear wax over the counter called debrox  It will soften up the ear wax while waiting for your appointment

## 2019-01-21 NOTE — Assessment & Plan Note (Signed)
Worse on L  Dev septum on exam  Causes sinus pressure and dizziness Ref to ENT Continue flonase and zyrtec

## 2019-01-21 NOTE — Assessment & Plan Note (Signed)
With nasal congestion /sinus pressure and hearing loss acute on chronic Bilateral horiz nystagmus on exam today  Using flonase and zyrted  Ref to ENT for further eval /tx  ? If candidate for therapy for vertigo

## 2019-01-23 ENCOUNTER — Ambulatory Visit (INDEPENDENT_AMBULATORY_CARE_PROVIDER_SITE_OTHER)
Admission: RE | Admit: 2019-01-23 | Discharge: 2019-01-23 | Disposition: A | Discharge status: Home / Self Care | Payer: Medicare Other | Source: Ambulatory Visit | Attending: Family Medicine | Admitting: Family Medicine

## 2019-01-23 ENCOUNTER — Ambulatory Visit: Payer: Medicare Other | Admitting: Family Medicine

## 2019-01-23 ENCOUNTER — Encounter: Payer: Self-pay | Admitting: Family Medicine

## 2019-01-23 ENCOUNTER — Other Ambulatory Visit: Payer: Self-pay

## 2019-01-23 VITALS — BP 118/62 | HR 65 | Temp 98.2°F | Ht 61.0 in | Wt 151.0 lb

## 2019-01-23 DIAGNOSIS — M1711 Unilateral primary osteoarthritis, right knee: Secondary | ICD-10-CM | POA: Diagnosis not present

## 2019-01-23 MED ORDER — METHYLPREDNISOLONE ACETATE 40 MG/ML IJ SUSP
80.0000 mg | Freq: Once | INTRAMUSCULAR | Status: AC
  Administered 2019-01-23: 80 mg via INTRA_ARTICULAR

## 2019-01-23 NOTE — Progress Notes (Signed)
Deborah Imel T. [name], MD Primary [facility] and [place] [facility] [place], [address] Phone: [phone]  FAX: Mona - 73 y.o. female  MRN [id number]  Date of Birth: April 18, 1946  Visit Date: 01/23/2019  PCP: Abner Greenspan, MD  Referred by: Tower, Wynelle [name], MD  Chief Complaint  Patient presents with  . Knee Pain    Right Knee Pain   Subjective:   Deborah Gardner is a 73 y.o. very pleasant female patient who presents with the following:  Known moderate OA, 05/2016: After this time, she saw Almedia Balls.  I saw her in 2013 for her back.  Knee is having a lot of problems getting in and out of a car and walking or exercise walking.  Just like sitting here could hurt it bad.   Picking it up and sitting down it will hurt.  When she gets up out of the shower.  Sometimes it feels like it is catches or getting stuck.  R knee, has a cycle in front of her knee.  Has a step stool. Now it is painful.   Doing some rehab. Rehab at home, cycling stationary while watching TV.  Tylenol arthritis. Some ibuprofen.   Long conversation with daughter on cell phone speaker  inj knee R  Past Medical History, Surgical History, Social History, Family History, Problem List, Medications, and Allergies have been reviewed and updated if relevant.  Patient Active Problem List   Diagnosis Date Noted  . Dizziness 01/21/2019  . Nasal congestion 01/21/2019  . Estrogen deficiency 05/07/2018  . Pelvic prolapse 05/07/2018  . Overactive bladder 05/07/2018  . Memory change 11/03/2016  . Osteoarthritis of right knee 05/22/2016  . Routine general medical examination at a health care facility 12/02/2014  . Encounter for Medicare annual wellness exam 11/21/2013  . Anxiety 11/21/2013  . Subclinical hyperthyroidism 06/18/2012  . Pruritus ani 03/08/2011  . UNSPECIFIED ESOPHAGITIS 08/12/2010  . OSTEOARTHRITIS, GENERALIZED, HAND  03/21/2010  . 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 ROTATOR CUFF SYNDROME, LEFT 03/21/2010  . ALLERGIC RHINITIS 11/03/2008  . ARTHRALGIA 10/08/2008  . ADENOMATOUS COLONIC POLYP 08/19/2007  . ESOPHAGITIS, REFLUX 08/19/2007  . HIATAL HERNIA 08/19/2007  . Hyperlipidemia 03/05/2007  . G E R D 03/05/2007  . Osteopenia 03/05/2007  . Essential hypertension 02/08/2007  . IBS 02/08/2007  . FIBROCYSTIC BREAST DISEASE 02/08/2007  . OSTEOARTHRITIS 02/08/2007  . [place] DISEASE, LUMBAR 02/08/2007  . STRESS INCONTINENCE 02/08/2007    Past Medical History:  Diagnosis Date  . Allergic rhinitis   . Allergy   . Arthritis   . Cataract    bil  . Colon polyps   . Esophagitis   . GERD (gastroesophageal reflux disease)   . Glaucoma   . HH (hiatus hernia)   . HTN (hypertension)   . Hyperlipidemia   . Increased pressure in the eye   . OA (osteoarthritis)    hands  . Osteoporosis   . Rectal pain     Past Surgical History:  Procedure Laterality Date  . ABDOMINAL HYSTERECTOMY     still has ovaries  . APPENDECTOMY    . BREAST CYST ASPIRATION  1/03  . CATARACT EXTRACTION  9/07  . ESOPHAGOGASTRODUODENOSCOPY  11/08  . HEMORRHOID SURGERY    . rectal sphincterotomy    . Retinal laser surgery    . UPPER GASTROINTESTINAL ENDOSCOPY      Social History   Socioeconomic History  .  Marital status: Widowed    Spouse name: Not on file  . Number of children: 2  . Years of education: Not on file  . Highest education level: Not on file  Occupational History  . Occupation: Retired    Fish farm manager: RETIRED  Social Needs  . Financial resource strain: Not on file  . Food insecurity    Worry: Not on file    Inability: Not on file  . Transportation needs    Medical: Not on file    Non-medical: Not on file  Tobacco Use  . Smoking status: Never Smoker  . Smokeless tobacco: Never Used  Substance and Sexual Activity  . Alcohol use: No    Alcohol/week: 0.0 standard drinks  . Drug use: No  . Sexual  activity: Yes    Partners: Male    Birth control/protection: Surgical  Lifestyle  . Physical activity    Days per week: Not on file    Minutes per session: Not on file  . Stress: Not on file  Relationships  . Social Herbalist on phone: Not on file    Gets together: Not on file    Attends religious service: Not on file    Active member of club or organization: Not on file    Attends meetings of clubs or organizations: Not on file    Relationship status: Widowed  . Intimate partner violence    Fear of current or ex partner: Not on file    Emotionally abused: Not on file    Physically abused: Not on file    Forced sexual activity: Not on file  Other Topics Concern  . Not on file  Social History Narrative   Married      2 children      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 with husband,  and cares for her brother--who had alcohol and stroke      Cared for elderly mother for years (she passed in 2009)    Family History  Problem Relation Age of Onset  . Colon cancer Father   . Pneumonia Father   . Thyroid disease Mother   . Depression Mother   . Hypertension Mother   . Osteoporosis Mother   . Liver disease Brother        from alcohol  . Breast cancer Paternal Aunt   . Alcohol abuse Brother        terminal   . Alcohol abuse Brother   . Thyroid disease Other        half sister  . Colon cancer Other        Aunt  . Breast cancer Maternal Aunt   . Breast cancer Paternal Aunt   . Colon polyps Neg Hx   . Esophageal cancer Neg Hx   . Stomach cancer Neg Hx   . Rectal cancer Neg Hx     Allergies  Allergen Reactions  . Atorvastatin Other (See Comments)    Memory loss    Medication list reviewed and updated in full in [place].  GEN: No fevers, chills. Nontoxic. Primarily MSK c/o today. MSK: Detailed in the HPI GI: tolerating PO intake without difficulty Neuro: No numbness, parasthesias, or tingling associated. Otherwise the pertinent positives of  the ROS are noted above.   Objective:   BP 118/62 (BP Location: Left Arm, Patient Position: Sitting, Cuff Size: Normal)   Pulse 65   Temp 98.2 F (36.8 C)   Ht 5\' 1"  (  1.549 m)   Wt 151 lb (68.5 kg)   SpO2 97%   BMI 28.53 kg/m    GEN: WDWN, NAD, Non-toxic, Alert & Oriented x 3 HEENT: Atraumatic, Normocephalic.  Ears and Nose: No external deformity. EXTR: No clubbing/cyanosis/edema NEURO: Normal gait.  PSYCH: Normally interactive. Conversant. Not depressed or anxious appearing.  Calm demeanor.    Right knee: Full extension.  Flexion to 120 degrees.  Minimal patellar motion. Pain at medial and lateral joint lines. Flexion pinch and mcmurrays cause pain but no mechanical symptoms  Radiology: No results found.  Assessment and Plan:     ICD-10-CM   1. Primary osteoarthritis of right knee  M17.11 DG Knee 4 Views W/Patella Right    methylPREDNISolone acetate (DEPO-MEDROL) injection 80 mg   >25 minutes spent in face to face time with patient, >50% spent in counselling or coordination of care:  Entire visit done along with speakerphone with daughter.    Reviewed OA in general, many questions answered. Conservative care. If fails, MRI is reasonable next step  Aspiration/Injection Procedure Note KOSHA JAQUITH [birth year] Date of procedure: 01/23/2019  Procedure: Large Joint Aspiration / Injection of Knee, RIGHT Indications: Pain  Procedure Details Patient verbally consented to procedure. Risks (including potential rare risk of infection), benefits, and alternatives explained. Sterilely prepped with Chloraprep. Ethyl cholride used for anesthesia. 8 cc Lidocaine 1% mixed with 2 mL Depo-Medrol 40 mg injected using the anteromedial approach without difficulty. No complications with procedure and tolerated well. Patient had decreased pain post-injection. Medication: 2 mL of Depo-Medrol 40 mg, equaling Depo-Medrol 80 mg total   Follow-up: Return in about 6 weeks (around 03/06/2019).   Meds ordered this encounter  Medications  . methylPREDNISolone acetate (DEPO-MEDROL) injection 80 mg   Orders Placed This Encounter  Procedures  . DG Knee 4 Views W/Patella Right    Signed,  Frederico Hamman T. Kreston Ahrendt, MD   Outpatient Encounter Medications as of 01/23/2019  Medication Sig  . amLODipine-benazepril (LOTREL) 5-10 MG capsule TAKE 1 CAPSULE BY MOUTH EVERY DAY  . aspirin 81 MG tablet Take 81 mg by mouth daily.  . Calcium Carb-Cholecalciferol (CALCIUM 600 + D PO) Take 1 tablet by mouth daily.  . Cholecalciferol (VITAMIN D) 1000 UNITS capsule Take 1,000 Units by mouth daily.   . fluticasone (FLONASE) 50 MCG/ACT nasal spray PLACE 2 SPRAYS INTO THE NOSE DAILY.  Marland Kitchen omeprazole (PRILOSEC) 20 MG capsule TAKE ONE CAPSULE BY MOUTH once a DAY BEFORE A MEAL  . rosuvastatin (CRESTOR) 10 MG tablet Take 1 tablet (10 mg total) by mouth daily.  . timolol (TIMOPTIC) 0.5 % ophthalmic solution Place 1 drop into both eyes 2 (two) times daily.   . [EXPIRED] methylPREDNISolone acetate (DEPO-MEDROL) injection 80 mg    No facility-administered encounter medications on file as of 01/23/2019.

## 2019-01-27 ENCOUNTER — Other Ambulatory Visit: Payer: Self-pay | Admitting: Family Medicine

## 2019-01-27 DIAGNOSIS — Z1231 Encounter for screening mammogram for malignant neoplasm of breast: Secondary | ICD-10-CM

## 2019-02-09 ENCOUNTER — Telehealth: Payer: Self-pay | Admitting: Family Medicine

## 2019-02-09 DIAGNOSIS — E7800 Pure hypercholesterolemia, unspecified: Secondary | ICD-10-CM

## 2019-02-09 DIAGNOSIS — I10 Essential (primary) hypertension: Secondary | ICD-10-CM

## 2019-02-09 DIAGNOSIS — E0590 Thyrotoxicosis, unspecified without thyrotoxic crisis or storm: Secondary | ICD-10-CM

## 2019-02-09 NOTE — Telephone Encounter (Signed)
-----   Message from Cloyd Stagers, RT sent at 02/07/2019 12:53 PM EDT ----- Regarding: Lab Orders for Monday 8.3.2020 Please place lab orders for Monday 8.3.2020, office visit for physical on Wednesday 8.5.2020 Thank you, Dyke Maes RT(R)

## 2019-02-10 ENCOUNTER — Other Ambulatory Visit (INDEPENDENT_AMBULATORY_CARE_PROVIDER_SITE_OTHER): Payer: Medicare Other

## 2019-02-10 ENCOUNTER — Ambulatory Visit: Payer: Medicare Other | Admitting: Family Medicine

## 2019-02-10 ENCOUNTER — Other Ambulatory Visit: Payer: Self-pay

## 2019-02-10 ENCOUNTER — Encounter: Payer: Self-pay | Admitting: Family Medicine

## 2019-02-10 VITALS — BP 118/80 | HR 79 | Temp 98.2°F | Ht 61.0 in

## 2019-02-10 DIAGNOSIS — M1711 Unilateral primary osteoarthritis, right knee: Secondary | ICD-10-CM | POA: Diagnosis not present

## 2019-02-10 DIAGNOSIS — E7800 Pure hypercholesterolemia, unspecified: Secondary | ICD-10-CM | POA: Diagnosis not present

## 2019-02-10 DIAGNOSIS — E0590 Thyrotoxicosis, unspecified without thyrotoxic crisis or storm: Secondary | ICD-10-CM

## 2019-02-10 DIAGNOSIS — I10 Essential (primary) hypertension: Secondary | ICD-10-CM

## 2019-02-10 DIAGNOSIS — M2391 Unspecified internal derangement of right knee: Secondary | ICD-10-CM | POA: Diagnosis not present

## 2019-02-10 DIAGNOSIS — M25561 Pain in right knee: Secondary | ICD-10-CM

## 2019-02-10 LAB — LIPID PANEL
Cholesterol: 151 mg/dL (ref 0–200)
HDL: 71.2 mg/dL (ref >39.00)
LDL Cholesterol: 56 mg/dL (ref 0–99)
NonHDL: 80.21
Total CHOL/HDL Ratio: 2
Triglycerides: 122 mg/dL (ref 0.0–149.0)
VLDL: 24.4 mg/dL (ref 0.0–40.0)

## 2019-02-10 LAB — COMPREHENSIVE METABOLIC PANEL
ALT: 10 U/L (ref 0–35)
AST: 11 U/L (ref 0–37)
Albumin: 4.4 g/dL (ref 3.5–5.2)
Alkaline Phosphatase: 71 U/L (ref 39–117)
BUN: 13 mg/dL (ref 6–23)
CO2: 27 mEq/L (ref 19–32)
Calcium: 9.9 mg/dL (ref 8.4–10.5)
Chloride: 104 mEq/L (ref 96–112)
Creatinine, Ser: 0.7 mg/dL (ref 0.40–1.20)
GFR: 81.45 mL/min (ref >60.00)
Glucose, Bld: 95 mg/dL (ref 70–99)
Potassium: 4.9 mEq/L (ref 3.5–5.1)
Sodium: 140 mEq/L (ref 135–145)
Total Bilirubin: 0.3 mg/dL (ref 0.2–1.2)
Total Protein: 7.2 g/dL (ref 6.0–8.3)

## 2019-02-10 LAB — CBC WITH DIFFERENTIAL/PLATELET
Basophils Absolute: 0.1 10*3/uL (ref 0.0–0.1)
Basophils Relative: 0.7 % (ref 0.0–3.0)
Eosinophils Absolute: 0.1 10*3/uL (ref 0.0–0.7)
Eosinophils Relative: 1.7 % (ref 0.0–5.0)
HCT: 37.6 % (ref 36.0–46.0)
Hemoglobin: 12.6 g/dL (ref 12.0–15.0)
Lymphocytes Relative: 28.1 % (ref 12.0–46.0)
Lymphs Abs: 2.3 10*3/uL (ref 0.7–4.0)
MCHC: 33.5 g/dL (ref 30.0–36.0)
MCV: 88.8 fl (ref 78.0–100.0)
Monocytes Absolute: 0.8 10*3/uL (ref 0.1–1.0)
Monocytes Relative: 9.3 % (ref 3.0–12.0)
Neutro Abs: 4.9 10*3/uL (ref 1.4–7.7)
Neutrophils Relative %: 60.2 % (ref 43.0–77.0)
Platelets: 214 10*3/uL (ref 150.0–400.0)
RBC: 4.24 Mil/uL (ref 3.87–5.11)
RDW: 13 % (ref 11.5–15.5)
WBC: 8.2 10*3/uL (ref 4.0–10.5)

## 2019-02-10 NOTE — Progress Notes (Signed)
Deborah Shugars T. Zigmond Trela, MD Primary Care and [place] at [facility] [name], [address] Phone: [phone]  FAX: [place] - 73 y.o. female  MRN [id number]  Date of Birth: 1946-06-27  Visit Date: 02/10/2019  PCP: Abner Greenspan, MD  Referred by: Tower, Wynelle [name], MD  Chief Complaint  Patient presents with  . Knee Pain    Right   Subjective:   Deborah Gardner is a 73 y.o. very pleasant female patient with Body mass index is 28.53 kg/m. who presents with the following:  Catching, popping. "popping out".  In this location, the patient does not note any change in her patella, but there is a deep locking sensation in her knee.  She is able to get this unstuck, but it does require force and sometimes the use of her hand and changing position.  She notices this sometimes when she is getting in and out of the car and it will get stuck.  On Saturday she was doing some yard work and pulling weeds and then it got stuck in a locked position.  Saturday was outside and pulling some weeds.  Pulling some weeks and got stuck in a locked out position.  On her last office visit I thought that this may just be osteoarthritis and I did a intra-articular steroid injection.  That helped for only short time.  01/23/2019 Last OV with Owens Loffler, MD  See OV  Known moderate OA, 05/2016: After this time, she saw [facility].  I saw her in 2013 for her back.  Knee is having a lot of problems getting in and out of a car and walking or exercise walking.  Just like sitting here could hurt it bad.   Picking it up and sitting down it will hurt.  When she gets up out of the shower.  Sometimes it feels like it is catches or getting stuck.  R knee, has a cycle in front of her knee.  Has a step stool. Now it is painful.   Doing some rehab. Rehab at home, cycling stationary while watching TV.  Tylenol arthritis. Some ibuprofen.    Long conversation with daughter on cell phone speaker   Past Medical History, Surgical History, Social History, Family History, Problem List, Medications, and Allergies have been reviewed and updated if relevant.  Patient Active Problem List   Diagnosis Date Noted  . Dizziness 01/21/2019  . Nasal congestion 01/21/2019  . Estrogen deficiency 05/07/2018  . Pelvic prolapse 05/07/2018  . Overactive bladder 05/07/2018  . Memory change 11/03/2016  . Osteoarthritis of right knee 05/22/2016  . Routine general medical examination at a health care facility 12/02/2014  . Encounter for Medicare annual wellness exam 11/21/2013  . Anxiety 11/21/2013  . Subclinical hyperthyroidism 06/18/2012  . Pruritus ani 03/08/2011  . UNSPECIFIED ESOPHAGITIS 08/12/2010  . OSTEOARTHRITIS, GENERALIZED, HAND 03/21/2010  . 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 ROTATOR CUFF SYNDROME, LEFT 03/21/2010  . ALLERGIC RHINITIS 11/03/2008  . ARTHRALGIA 10/08/2008  . ADENOMATOUS COLONIC POLYP 08/19/2007  . ESOPHAGITIS, REFLUX 08/19/2007  . HIATAL HERNIA 08/19/2007  . Hyperlipidemia 03/05/2007  . G E R D 03/05/2007  . Osteopenia 03/05/2007  . Essential hypertension 02/08/2007  . IBS 02/08/2007  . FIBROCYSTIC BREAST DISEASE 02/08/2007  . OSTEOARTHRITIS 02/08/2007  . [place] DISEASE, LUMBAR 02/08/2007  . STRESS INCONTINENCE 02/08/2007    Past Medical History:  Diagnosis Date  . Allergic rhinitis   .  Allergy   . Arthritis   . Cataract    bil  . Colon polyps   . Esophagitis   . GERD (gastroesophageal reflux disease)   . Glaucoma   . HH (hiatus hernia)   . HTN (hypertension)   . Hyperlipidemia   . Increased pressure in the eye   . OA (osteoarthritis)    hands  . Osteoporosis   . Rectal pain     Past Surgical History:  Procedure Laterality Date  . ABDOMINAL HYSTERECTOMY     still has ovaries  . APPENDECTOMY    . BREAST CYST ASPIRATION  1/03  . CATARACT EXTRACTION  9/07  .  ESOPHAGOGASTRODUODENOSCOPY  11/08  . HEMORRHOID SURGERY    . rectal sphincterotomy    . Retinal laser surgery    . UPPER GASTROINTESTINAL ENDOSCOPY      Social History   Socioeconomic History  . Marital status: Widowed    Spouse name: Not on file  . Number of children: 2  . Years of education: Not on file  . Highest education level: Not on file  Occupational History  . Occupation: Retired    Fish farm manager: RETIRED  Social Needs  . Financial resource strain: Not on file  . Food insecurity    Worry: Not on file    Inability: Not on file  . Transportation needs    Medical: Not on file    Non-medical: Not on file  Tobacco Use  . Smoking status: Never Smoker  . Smokeless tobacco: Never Used  Substance and Sexual Activity  . Alcohol use: No    Alcohol/week: 0.0 standard drinks  . Drug use: No  . Sexual activity: Yes    Partners: Male    Birth control/protection: Surgical  Lifestyle  . Physical activity    Days per week: Not on file    Minutes per session: Not on file  . Stress: Not on file  Relationships  . Social Herbalist on phone: Not on file    Gets together: Not on file    Attends religious service: Not on file    Active member of club or organization: Not on file    Attends meetings of clubs or organizations: Not on file    Relationship status: Widowed  . Intimate partner violence    Fear of current or ex partner: Not on file    Emotionally abused: Not on file    Physically abused: Not on file    Forced sexual activity: Not on file  Other Topics Concern  . Not on file  Social History Narrative   Married      2 children      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 with husband,  and cares for her brother--who had alcohol and stroke      Cared for elderly mother for years (she passed in 2009)    Family History  Problem Relation Age of Onset  . Colon cancer Father   . Pneumonia Father   . Thyroid disease Mother   . Depression Mother   .  Hypertension Mother   . Osteoporosis Mother   . Liver disease Brother        from alcohol  . Breast cancer Paternal Aunt   . Alcohol abuse Brother        terminal   . Alcohol abuse Brother   . Thyroid disease Other        half sister  . Colon cancer  Other        Aunt  . Breast cancer Maternal Aunt   . Breast cancer Paternal Aunt   . Colon polyps Neg Hx   . Esophageal cancer Neg Hx   . Stomach cancer Neg Hx   . Rectal cancer Neg Hx     Allergies  Allergen Reactions  . Atorvastatin Other (See Comments)    Memory loss    Medication list reviewed and updated in full in [facility].  GEN: No fevers, chills. Nontoxic. Primarily MSK c/o today. MSK: Detailed in the HPI GI: tolerating PO intake without difficulty Neuro: No numbness, parasthesias, or tingling associated. Otherwise the pertinent positives of the ROS are noted above.   Objective:   BP 118/80   Pulse 79   Temp 98.2 F (36.8 C) (Temporal)   Ht 5\' 1"  (1.549 m)   SpO2 99%   BMI 28.53 kg/m    GEN: WDWN, NAD, Non-toxic, Alert & Oriented x 3 HEENT: Atraumatic, Normocephalic.  Ears and Nose: No external deformity. EXTR: No clubbing/cyanosis/edema NEURO: Normal gait.  PSYCH: Normally interactive. Conversant. Not depressed or anxious appearing.  Calm demeanor.    Right knee: Full extension, flexion to 115 degrees.  Minimal patellar motion.  Pain at the medial and lateral joint side.  Flexion pinch and McMurray's causes pain without mechanical symptoms.  ACL, PCL, and MCL are all intact.  Radiology: Dg Knee 4 Views W/patella Right  Result Date: 01/23/2019 CLINICAL DATA:  Chronic knee pain. Osteoarthritis. EXAM: RIGHT KNEE - COMPLETE 4+ VIEW COMPARISON:  Right knee radiographs 05/25/2016 FINDINGS: No acute fracture, dislocation, or knee joint effusion is identified. Moderate patellofemoral joint space narrowing appears to have mildly progressed, and there is mild patellofemoral osteophytosis. Mild-to-moderate  marginal osteophytosis involving the medial and lateral compartments is similar to the prior study without significant joint space loss. The soft tissues are unremarkable. IMPRESSION: Tricompartmental osteoarthrosis, slightly progressed in the patellofemoral compartment. Electronically Signed   By: Logan Bores M.D.   On: 01/23/2019 16:09    Assessment and Plan:     ICD-10-CM   1. Acute pain of right knee  M25.561 MR Knee Right Wo Contrast    Ambulatory referral to Orthopedic Surgery    CANCELED: Ambulatory referral to Orthopedic Surgery  2. Locking knee, right  M23.91 Ambulatory referral to Orthopedic Surgery    CANCELED: Ambulatory referral to Orthopedic Surgery  3. Primary osteoarthritis of right knee  M17.11 Ambulatory referral to Orthopedic Surgery    CANCELED: Ambulatory referral to Orthopedic Surgery  4. Essential hypertension  I10 TSH    Lipid panel    Comprehensive metabolic panel    CBC with Differential/Platelet  5. Subclinical hyperthyroidism  E05.90 TSH    T4, free  6. Pure hypercholesterolemia  E78.00 Lipid panel   Given the acute locking of her knee, intra-articular, suspect loose body versus large-scale meniscal tear causing locking and mechanical symptoms.  Failure of conservative management including home rehab, anti-inflammatories, Tylenol, icing, intra-articular steroid injection.  Obtain an MRI of the right knee to evaluate for loose body as well as large meniscal tear.  I have a high clinical level of concern, the patient is deteriorating, and I am going to go ahead and consult orthopedics for their opinion.  I also ordered her labs for her primary care doctor.  Follow-up: No follow-ups on file.  No orders of the defined types were placed in this encounter.  Orders Placed This Encounter  Procedures  . MR Knee Right  Wo Contrast  . Ambulatory referral to Orthopedic Surgery    Signed,  Frederico Hamman T. Christina Gintz, MD   Outpatient Encounter Medications as of  02/10/2019  Medication Sig  . amLODipine-benazepril (LOTREL) 5-10 MG capsule TAKE 1 CAPSULE BY MOUTH EVERY DAY  . aspirin 81 MG tablet Take 81 mg by mouth daily.  . Calcium Carb-Cholecalciferol (CALCIUM 600 + D PO) Take 1 tablet by mouth daily.  . Cholecalciferol (VITAMIN D) 1000 UNITS capsule Take 1,000 Units by mouth daily.   . fluticasone (FLONASE) 50 MCG/ACT nasal spray PLACE 2 SPRAYS INTO THE NOSE DAILY.  Marland Kitchen omeprazole (PRILOSEC) 20 MG capsule TAKE ONE CAPSULE BY MOUTH once a DAY BEFORE A MEAL  . rosuvastatin (CRESTOR) 10 MG tablet Take 1 tablet (10 mg total) by mouth daily.  . timolol (TIMOPTIC) 0.5 % ophthalmic solution Place 1 drop into both eyes 2 (two) times daily.    No facility-administered encounter medications on file as of 02/10/2019.

## 2019-02-11 LAB — TSH: TSH: 0.71 u[IU]/mL (ref 0.35–4.50)

## 2019-02-11 LAB — T4, FREE: Free T4: 0.95 ng/dL (ref 0.60–1.60)

## 2019-02-12 ENCOUNTER — Other Ambulatory Visit: Payer: Self-pay

## 2019-02-12 ENCOUNTER — Ambulatory Visit: Payer: Medicare Other

## 2019-02-12 ENCOUNTER — Ambulatory Visit (INDEPENDENT_AMBULATORY_CARE_PROVIDER_SITE_OTHER): Payer: Medicare Other | Admitting: Family Medicine

## 2019-02-12 ENCOUNTER — Encounter: Payer: Self-pay | Admitting: Family Medicine

## 2019-02-12 VITALS — BP 124/72 | HR 60 | Temp 98.0°F | Resp 18 | Ht 61.0 in | Wt 149.2 lb

## 2019-02-12 DIAGNOSIS — Z0000 Encounter for general adult medical examination without abnormal findings: Secondary | ICD-10-CM

## 2019-02-12 DIAGNOSIS — E0590 Thyrotoxicosis, unspecified without thyrotoxic crisis or storm: Secondary | ICD-10-CM

## 2019-02-12 DIAGNOSIS — R413 Other amnesia: Secondary | ICD-10-CM

## 2019-02-12 DIAGNOSIS — M1711 Unilateral primary osteoarthritis, right knee: Secondary | ICD-10-CM

## 2019-02-12 DIAGNOSIS — K2100 Gastro-esophageal reflux disease with esophagitis, without bleeding: Secondary | ICD-10-CM

## 2019-02-12 DIAGNOSIS — I10 Essential (primary) hypertension: Secondary | ICD-10-CM | POA: Diagnosis not present

## 2019-02-12 DIAGNOSIS — E7800 Pure hypercholesterolemia, unspecified: Secondary | ICD-10-CM

## 2019-02-12 DIAGNOSIS — M85852 Other specified disorders of bone density and structure, left thigh: Secondary | ICD-10-CM

## 2019-02-12 DIAGNOSIS — M85851 Other specified disorders of bone density and structure, right thigh: Secondary | ICD-10-CM | POA: Diagnosis not present

## 2019-02-12 MED ORDER — AMLODIPINE BESY-BENAZEPRIL HCL 5-10 MG PO CAPS: ORAL_CAPSULE | ORAL | 3 refills | Status: DC

## 2019-02-12 MED ORDER — ROSUVASTATIN CALCIUM 10 MG PO TABS: 10.0000 mg | ORAL_TABLET | Freq: Every day | ORAL | 3 refills | Status: DC

## 2019-02-12 MED ORDER — OMEPRAZOLE 20 MG PO CPDR: DELAYED_RELEASE_CAPSULE | ORAL | 3 refills | Status: DC

## 2019-02-12 NOTE — Assessment & Plan Note (Signed)
Labs are fine this time Lab Results  Component Value Date   TSH 0.71 02/10/2019   Nl FT4

## 2019-02-12 NOTE — Assessment & Plan Note (Signed)
Doing well overall  No clinical changes - family reports as well

## 2019-02-12 NOTE — Patient Instructions (Addendum)
Make sure to get a flu shot this fall   Take care of yourself   Be careful/ wear a mask   Eat a healthy balanced diet  Stay as active as you can be   Good luck with the knee problems

## 2019-02-12 NOTE — Assessment & Plan Note (Signed)
No more heartburn Some gurgling /growling  Disc elevating head of bed if needed

## 2019-02-12 NOTE — Assessment & Plan Note (Signed)
Reviewed health habits including diet and exercise and skin cancer prevention Reviewed appropriate screening tests for age  Also reviewed health mt list, fam hx and immunization status , as well as social and family history   See HPI Labs reviewed -stable  Enc her to get a flu shot in the fall  Mammogram planned for early sept dexa reviewed (declines tx at this time) -taking ca and D Fall precautions discussed  Pt has adv directive  No new cognitive concerns  Pt has f/u with ortho and MRI for R knee soon-may need surgery  Enc her to stay active mentally and physically as much as is safe

## 2019-02-12 NOTE — Assessment & Plan Note (Signed)
Disc goals for lipids and reasons to control them Rev last labs with pt Rev low sat fat diet in detail Good control with crestor and diet

## 2019-02-12 NOTE — Assessment & Plan Note (Signed)
With possible meniscal tear  For MRI soon and ortho f/u

## 2019-02-12 NOTE — Assessment & Plan Note (Signed)
Rev dexa 2019  Declines further tx  Repeat 2 y  Taking D and Ca Exercise as tolerated No falls or fx

## 2019-02-12 NOTE — Progress Notes (Signed)
Subjective:    Patient ID: Deborah Gardner, female    DOB: 03/08/1946, 73 y.o.   MRN: [id number]  HPI Pt presents for amw and health mt exam as well as rev of chronic medical problems   I have personally reviewed the Medicare Annual Wellness questionnaire and have noted 1. The patient's medical and social history 2. Their use of alcohol, tobacco or illicit drugs 3. Their current medications and supplements 4. The patient's functional ability including ADL's, fall risks, home safety risks and hearing or visual             impairment. 5. Diet and physical activities 6. Evidence for depression or mood disorders  The patients weight, height, BMI have been recorded in the chart and visual acuity is per eye clinic.  I have made referrals, counseling and provided education to the patient based review of the above and I have provided the pt with a written personalized care plan for preventive services. Reviewed and updated provider list, see scanned forms.  See scanned forms.  Routine anticipatory guidance given to patient.  See health maintenance. Colon cancer screening colonoscopy 12/19 - recall if 5 y  Breast cancer screening- mammogram 6/19 , has one scheduled for 03/13/19 Self breast exam- no lumps or changes  Flu vaccine 10/19 Tetanus vaccine Td in 06 Pneumovax completed Zoster vaccine  zostavax 5/11 dexa 12/19 osteopenia (down 5% in the FN) Falls-none  (using a walker right now)  Fractures -none  Supplements - vit D and calcium  Exercise -is active/works in the yard when she can (back and knees bother her)  We talked about evista- she choose to hold off  Advance directive- has a living will and pos  Cognitive function addressed- see scanned forms- and if abnormal then additional documentation follows.  She has short term forgetfulness -but gets overwhelmed easily   She has reflective time in am/ like meditation Husband died a year ago   PMH and [place] reviewed  Meds, vitals, and  allergies reviewed.   ROS: See HPI.  Otherwise negative.    Really struggling with her knee  Twisted/locked  Planning MRI and ortho- upcoming   GERD is bothersome at night  A lot of growling  ? Worse with stress  No pain or trouble swallowing   Weight  Wt Readings from Last 3 Encounters:  02/12/19 149 lb 3 oz (67.7 kg)  01/23/19 151 lb (68.5 kg)  01/21/19 150 lb 8 oz (68.3 kg)  mt weight/stable  28.19 kg/m    Hearing/vision   Hearing Screening   125Hz  250Hz  500Hz  1000Hz  2000Hz  3000Hz  4000Hz  6000Hz  8000Hz   Right ear:   25 25 20   0    Left ear:   20 25 20  25     Vision Screening Comments: Had eye exam at 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- she had removed recently   Hypertension  bp is stable today  No cp or palpitations or headaches or edema  No side effects to medicines  BP Readings from Last 3 Encounters:  02/12/19 124/72  02/10/19 118/80  01/23/19 118/62       Subclinical hypothyroid Lab Results  Component Value Date   TSH 0.71 02/10/2019   normal FT4 at 0.95   Hyperlipidemia Lab Results  Component Value Date   CHOL 151 02/10/2019   CHOL 139 05/02/2018   CHOL 142 04/26/2017   Lab Results  Component Value Date   HDL  71.20 02/10/2019   HDL 67.70 05/02/2018   HDL 64.00 04/26/2017   Lab Results  Component Value Date   LDLCALC 56 02/10/2019   LDLCALC 59 05/02/2018   LDLCALC 54 04/26/2017   Lab Results  Component Value Date   TRIG 122.0 02/10/2019   TRIG 63.0 05/02/2018   TRIG 121.0 04/26/2017   Lab Results  Component Value Date   CHOLHDL 2 02/10/2019   CHOLHDL 2 05/02/2018   CHOLHDL 2 04/26/2017   No results found for: LDLDIRECT Controlled with crestor and diet   Lab Results  Component Value Date   CREATININE 0.70 02/10/2019   BUN 13 02/10/2019   NA 140 02/10/2019   K 4.9 02/10/2019   CL 104 02/10/2019   CO2 27 02/10/2019   Lab Results  Component Value Date   ALT 10 02/10/2019   AST 11  02/10/2019   ALKPHOS 71 02/10/2019   BILITOT 0.3 02/10/2019    Lab Results  Component Value Date   WBC 8.2 02/10/2019   HGB 12.6 02/10/2019   HCT 37.6 02/10/2019   MCV 88.8 02/10/2019   PLT 214.0 02/10/2019   glucose 95   Patient Active Problem List   Diagnosis Date Noted  . Nasal congestion 01/21/2019  . Estrogen deficiency 05/07/2018  . Pelvic prolapse 05/07/2018  . Overactive bladder 05/07/2018  . Memory change 11/03/2016  . Osteoarthritis of right knee 05/22/2016  . Routine general medical examination at a health care facility 12/02/2014  . Encounter for Medicare annual wellness exam 11/21/2013  . Anxiety 11/21/2013  . Subclinical hyperthyroidism 06/18/2012  . Pruritus ani 03/08/2011  . UNSPECIFIED ESOPHAGITIS 08/12/2010  . OSTEOARTHRITIS, GENERALIZED, HAND 03/21/2010  . 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 ROTATOR CUFF SYNDROME, LEFT 03/21/2010  . ALLERGIC RHINITIS 11/03/2008  . ARTHRALGIA 10/08/2008  . ADENOMATOUS COLONIC POLYP 08/19/2007  . ESOPHAGITIS, REFLUX 08/19/2007  . HIATAL HERNIA 08/19/2007  . Hyperlipidemia 03/05/2007  . G E R D 03/05/2007  . Osteopenia 03/05/2007  . Essential hypertension 02/08/2007  . IBS 02/08/2007  . FIBROCYSTIC BREAST DISEASE 02/08/2007  . OSTEOARTHRITIS 02/08/2007  . [place] DISEASE, LUMBAR 02/08/2007  . STRESS INCONTINENCE 02/08/2007   Past Medical History:  Diagnosis Date  . Allergic rhinitis   . Allergy   . Arthritis   . Cataract    bil  . Colon polyps   . Esophagitis   . GERD (gastroesophageal reflux disease)   . Glaucoma   . HH (hiatus hernia)   . HTN (hypertension)   . Hyperlipidemia   . Increased pressure in the eye   . OA (osteoarthritis)    hands  . Osteoporosis   . Rectal pain    Past Surgical History:  Procedure Laterality Date  . ABDOMINAL HYSTERECTOMY     still has ovaries  . APPENDECTOMY    . BREAST CYST ASPIRATION  1/03  . CATARACT EXTRACTION  9/07  . ESOPHAGOGASTRODUODENOSCOPY  11/08  .  HEMORRHOID SURGERY    . rectal sphincterotomy    . Retinal laser surgery    . UPPER GASTROINTESTINAL ENDOSCOPY     Social History   Tobacco Use  . Smoking status: Never Smoker  . Smokeless tobacco: Never Used  Substance Use Topics  . Alcohol use: No    Alcohol/week: 0.0 standard drinks  . Drug use: No   Family History  Problem Relation Age of Onset  . Colon cancer Father   . Pneumonia Father   . Thyroid disease Mother   . Depression Mother   .  Hypertension Mother   . Osteoporosis Mother   . Liver disease Brother        from alcohol  . Breast cancer Paternal Aunt   . Alcohol abuse Brother        terminal   . Alcohol abuse Brother   . Thyroid disease Other        half sister  . Colon cancer Other        Aunt  . Breast cancer Maternal Aunt   . Breast cancer Paternal Aunt   . Colon polyps Neg Hx   . Esophageal cancer Neg Hx   . Stomach cancer Neg Hx   . Rectal cancer Neg Hx    Allergies  Allergen Reactions  . Atorvastatin Other (See Comments)    Memory loss   Current Outpatient Medications on File Prior to Visit  Medication Sig Dispense Refill  . aspirin 81 MG tablet Take 81 mg by mouth daily.    . Calcium Carb-Cholecalciferol (CALCIUM 600 + D PO) Take 1 tablet by mouth daily.    . Cholecalciferol (VITAMIN D) 1000 UNITS capsule Take 1,000 Units by mouth daily.     . fluticasone (FLONASE) 50 MCG/ACT nasal spray PLACE 2 SPRAYS INTO THE NOSE DAILY. 48 g 1  . timolol (TIMOPTIC) 0.5 % ophthalmic solution Place 1 drop into both eyes 2 (two) times daily.      No current facility-administered medications on file prior to visit.     Review of Systems  Constitutional: Negative for activity change, appetite change, fatigue, fever and unexpected weight change.  HENT: Negative for congestion, ear pain, rhinorrhea, sinus pressure and sore throat.   Eyes: Negative for pain, redness and visual disturbance.  Respiratory: Negative for cough, shortness of breath and wheezing.    Cardiovascular: Negative for chest pain and palpitations.  Gastrointestinal: Negative for abdominal pain, blood in stool, constipation and diarrhea.  Endocrine: Negative for polydipsia and polyuria.  Genitourinary: Negative for dysuria, frequency and urgency.  Musculoskeletal: Positive for arthralgias. Negative for back pain and myalgias.  Skin: Negative for pallor and rash.  Allergic/Immunologic: Negative for environmental allergies.  Neurological: Negative for dizziness, syncope and headaches.  Hematological: Negative for adenopathy. Does not bruise/bleed easily.  Psychiatric/Behavioral: Negative for decreased concentration and dysphoric mood. The patient is not nervous/anxious.        Objective:   Physical Exam Constitutional:      General: She is not in acute distress.    Appearance: Normal appearance. She is well-developed and normal weight. She is not ill-appearing or diaphoretic.     Comments: Exam done on wheelchair due to knee problems/mobility impairment   HENT:     Head: Normocephalic and atraumatic.     Right Ear: Tympanic membrane, ear canal and external ear normal.     Left Ear: Tympanic membrane, ear canal and external ear normal.     Nose: Nose normal. No rhinorrhea.     Mouth/Throat:     Mouth: Mucous membranes are moist.     Pharynx: Oropharynx is clear. No posterior oropharyngeal erythema.  Eyes:     General: No scleral icterus.    Conjunctiva/sclera: Conjunctivae normal.     Pupils: Pupils are equal, round, and reactive to light.  Neck:     Musculoskeletal: Normal range of motion and neck supple. No neck rigidity or muscular tenderness.     Thyroid: No thyromegaly.     Vascular: No carotid bruit or JVD.  Cardiovascular:     Rate and Rhythm:  Normal rate and regular rhythm.     Pulses: Normal pulses.     Heart sounds: Normal heart sounds. No murmur. No gallop.   Pulmonary:     Effort: Pulmonary effort is normal. No respiratory distress.     Breath sounds:  Normal breath sounds. No wheezing or rales.     Comments: Good air exch Chest:     Chest wall: No tenderness.  Abdominal:     General: Bowel sounds are normal. There is no distension or abdominal bruit.     Palpations: Abdomen is soft. There is no mass.     Tenderness: There is no abdominal tenderness.     Hernia: No hernia is present.  Genitourinary:    Comments: Breast exam: No mass, nodules, thickening, tenderness, bulging, retraction, inflamation, nipple discharge or skin changes noted.  No axillary or clavicular LA.     Exam done sitting  Musculoskeletal: Normal range of motion.        General: No tenderness.     Right lower leg: No edema.     Left lower leg: No edema.     Comments: R knee is swollen with poor rom  Lymphadenopathy:     Cervical: No cervical adenopathy.  Skin:    General: Skin is warm and dry.     Coloration: Skin is not pale.     Findings: No erythema or rash.     Comments: sks diffusely  fair  Neurological:     Mental Status: She is alert.     Cranial Nerves: No cranial nerve deficit.     Motor: No weakness or abnormal muscle tone.     Coordination: Coordination normal.     Deep Tendon Reflexes: Reflexes are normal and symmetric. Reflexes normal.  Psychiatric:        Mood and Affect: Mood normal. Mood is not anxious or depressed.        Cognition and Memory: Cognition and memory normal.     Comments: Cognitively sharp today           Assessment & Plan:   Problem List Items Addressed This Visit      Cardiovascular and Mediastinum   Essential hypertension    bp in fair control at this time  BP Readings from Last 1 Encounters:  02/12/19 124/72   No changes needed Most recent labs reviewed  Disc lifstyle change with low sodium diet and exercise        Relevant Medications   amLODipine-benazepril (LOTREL) 5-10 MG capsule   rosuvastatin (CRESTOR) 10 MG tablet     Digestive   G E R D    No more heartburn Some gurgling /growling  Disc  elevating head of bed if needed      Relevant Medications   omeprazole (PRILOSEC) 20 MG capsule     Endocrine   Subclinical hyperthyroidism    Labs are fine this time Lab Results  Component Value Date   TSH 0.71 02/10/2019   Nl FT4        Musculoskeletal and Integument   Osteopenia    Rev dexa 2019  Declines further tx  Repeat 2 y  Taking D and Ca Exercise as tolerated No falls or fx      Osteoarthritis of right knee    With possible meniscal tear  For MRI soon and ortho f/u        Other   Hyperlipidemia    Disc goals for lipids and reasons to control them Rev  last labs with pt Rev low sat fat diet in detail Good control with crestor and diet       Relevant Medications   amLODipine-benazepril (LOTREL) 5-10 MG capsule   rosuvastatin (CRESTOR) 10 MG tablet   Encounter for Medicare annual wellness exam - Primary    Reviewed health habits including diet and exercise and skin cancer prevention Reviewed appropriate screening tests for age  Also reviewed health mt list, fam hx and immunization status , as well as social and family history   See HPI Labs reviewed -stable  Enc her to get a flu shot in the fall  Mammogram planned for early sept dexa reviewed (declines tx at this time) -taking ca and D Fall precautions discussed  Pt has adv directive  No new cognitive concerns  Pt has f/u with ortho and MRI for R knee soon-may need surgery  Enc her to stay active mentally and physically as much as is safe      Routine general medical examination at a health care facility    Reviewed health habits including diet and exercise and skin cancer prevention Reviewed appropriate screening tests for age  Also reviewed health mt list, fam hx and immunization status , as well as social and family history   See HPI Labs reviewed -stable  Enc her to get a flu shot in the fall  Mammogram planned for early sept dexa reviewed (declines tx at this time) -taking ca and D Fall  precautions discussed  Pt has adv directive  No new cognitive concerns  Pt has f/u with ortho and MRI for R knee soon-may need surgery  Enc her to stay active mentally and physically as much as is safe        Memory change    Doing well overall  No clinical changes - family reports as well

## 2019-02-12 NOTE — Assessment & Plan Note (Signed)
bp in fair control at this time  BP Readings from Last 1 Encounters:  02/12/19 124/72   No changes needed Most recent labs reviewed  Disc lifstyle change with low sodium diet and exercise

## 2019-02-14 ENCOUNTER — Other Ambulatory Visit: Payer: Self-pay

## 2019-02-14 ENCOUNTER — Ambulatory Visit (HOSPITAL_COMMUNITY)
Admission: RE | Admit: 2019-02-14 | Discharge: 2019-02-14 | Disposition: A | Discharge status: Home / Self Care | Payer: Medicare Other | Source: Ambulatory Visit | Attending: Family Medicine | Admitting: Family Medicine

## 2019-02-14 DIAGNOSIS — M25561 Pain in right knee: Secondary | ICD-10-CM | POA: Insufficient documentation

## 2019-03-12 ENCOUNTER — Ambulatory Visit: Payer: Medicare Other | Admitting: Family Medicine

## 2019-03-13 ENCOUNTER — Other Ambulatory Visit: Payer: Self-pay

## 2019-03-13 ENCOUNTER — Ambulatory Visit
Admission: RE | Admit: 2019-03-13 | Discharge: 2019-03-13 | Disposition: A | Discharge status: Home / Self Care | Payer: Medicare Other | Source: Ambulatory Visit | Attending: Family Medicine | Admitting: Family Medicine

## 2019-03-13 DIAGNOSIS — Z1231 Encounter for screening mammogram for malignant neoplasm of breast: Secondary | ICD-10-CM

## 2019-04-11 ENCOUNTER — Encounter: Payer: Self-pay | Admitting: Internal Medicine

## 2019-04-11 ENCOUNTER — Ambulatory Visit: Payer: Medicare Other | Admitting: Internal Medicine

## 2019-04-11 ENCOUNTER — Ambulatory Visit: Payer: Medicare Other | Admitting: Family Medicine

## 2019-04-11 ENCOUNTER — Other Ambulatory Visit: Payer: Self-pay

## 2019-04-11 DIAGNOSIS — L03111 Cellulitis of right axilla: Secondary | ICD-10-CM | POA: Diagnosis not present

## 2019-04-11 MED ORDER — CEPHALEXIN 500 MG PO CAPS: 500.0000 mg | ORAL_CAPSULE | Freq: Three times a day (TID) | ORAL | 0 refills | Status: DC

## 2019-04-11 NOTE — Assessment & Plan Note (Addendum)
She wonders if there was a spider bite Not classic appearance for MRSA Will try cephalexin---if doesn't respond, would change to doxy Continue with heat (nothing on exam to suggest I&D would be helpful)

## 2019-04-11 NOTE — Progress Notes (Signed)
Subjective:    Patient ID: Deborah Gardner, female    DOB: 05-28-1946, 73 y.o.   MRN: [id number]  HPI Here due to concern about a skin lesion  Concerned she may have been bitten by a spider at night--a few nights ago (4-5 nights ago) Showed pharmacist and though it could a bite Very tender, swollen and warm Tried preparation H--- alcohol, etc Hot compresses Nothing has helped Also tried triple antibiotic, cortisone 10  Now also has a spot on her right ear Now with itching and "heat in it"  Current Outpatient Medications on File Prior to Visit  Medication Sig Dispense Refill  . amLODipine-benazepril (LOTREL) 5-10 MG capsule TAKE 1 CAPSULE BY MOUTH EVERY DAY 90 capsule 3  . Apoaequorin (PREVAGEN PO) Take by mouth.    Marland Kitchen aspirin 81 MG tablet Take 81 mg by mouth daily.    . Calcium Carb-Cholecalciferol (CALCIUM 600 + D PO) Take 1 tablet by mouth daily.    . Cholecalciferol (VITAMIN D) 1000 UNITS capsule Take 1,000 Units by mouth daily.     . fluticasone (FLONASE) 50 MCG/ACT nasal spray PLACE 2 SPRAYS INTO THE NOSE DAILY. 48 g 1  . omeprazole (PRILOSEC) 20 MG capsule TAKE ONE CAPSULE BY MOUTH once a DAY BEFORE A MEAL 90 capsule 3  . rosuvastatin (CRESTOR) 10 MG tablet Take 1 tablet (10 mg total) by mouth daily. 90 tablet 3  . timolol (TIMOPTIC) 0.5 % ophthalmic solution Place 1 drop into both eyes 2 (two) times daily.      No current facility-administered medications on file prior to visit.     Allergies  Allergen Reactions  . Atorvastatin Other (See Comments)    Memory loss    Past Medical History:  Diagnosis Date  . Allergic rhinitis   . Allergy   . Arthritis   . Cataract    bil  . Colon polyps   . Esophagitis   . GERD (gastroesophageal reflux disease)   . Glaucoma   . HH (hiatus hernia)   . HTN (hypertension)   . Hyperlipidemia   . Increased pressure in the eye   . OA (osteoarthritis)    hands  . Osteoporosis   . Rectal pain     Past Surgical History:   Procedure Laterality Date  . ABDOMINAL HYSTERECTOMY     still has ovaries  . APPENDECTOMY    . BREAST CYST ASPIRATION  1/03  . CATARACT EXTRACTION  9/07  . ESOPHAGOGASTRODUODENOSCOPY  11/08  . HEMORRHOID SURGERY    . rectal sphincterotomy    . Retinal laser surgery    . UPPER GASTROINTESTINAL ENDOSCOPY      Family History  Problem Relation Age of Onset  . Colon cancer Father   . Pneumonia Father   . Thyroid disease Mother   . Depression Mother   . Hypertension Mother   . Osteoporosis Mother   . Liver disease Brother        from alcohol  . Breast cancer Paternal Aunt   . Alcohol abuse Brother        terminal   . Alcohol abuse Brother   . Thyroid disease Other        half sister  . Colon cancer Other        Aunt  . Breast cancer Maternal Aunt   . Breast cancer Paternal Aunt   . Colon polyps Neg Hx   . Esophageal cancer Neg Hx   . Stomach cancer Neg Hx   .  Rectal cancer Neg Hx     Social History   Socioeconomic History  . Marital status: Widowed    Spouse name: Not on file  . Number of children: 2  . Years of education: Not on file  . Highest education level: Not on file  Occupational History  . Occupation: Retired    Fish farm manager: RETIRED  Social Needs  . Financial resource strain: Not on file  . Food insecurity    Worry: Not on file    Inability: Not on file  . Transportation needs    Medical: Not on file    Non-medical: Not on file  Tobacco Use  . Smoking status: Never Smoker  . Smokeless tobacco: Never Used  Substance and Sexual Activity  . Alcohol use: No    Alcohol/week: 0.0 standard drinks  . Drug use: No  . Sexual activity: Yes    Partners: Male    Birth control/protection: Surgical  Lifestyle  . Physical activity    Days per week: Not on file    Minutes per session: Not on file  . Stress: Not on file  Relationships  . Social Herbalist on phone: Not on file    Gets together: Not on file    Attends religious service: Not on file     Active member of club or organization: Not on file    Attends meetings of clubs or organizations: Not on file    Relationship status: Widowed  . Intimate partner violence    Fear of current or ex partner: Not on file    Emotionally abused: Not on file    Physically abused: Not on file    Forced sexual activity: Not on file  Other Topics Concern  . Not on file  Social History Narrative   Married      2 children      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 with husband,  and cares for her brother--who had alcohol and stroke      Cared for elderly mother for years (she passed in 2009)   Review of Systems  No fever Felt "yuck" the day after finding the spot under arm---fine since then Has used some zyrtec also     Objective:   Physical Exam  Constitutional: No distress.  HENT:  Slight redness but no lesions or warmth on right pinna  Skin:  2cm slightly raised area in right lower axilla Small vesicle on top No fluctuance but deep indurated area           Assessment & Plan:

## 2019-04-24 ENCOUNTER — Telehealth: Payer: Self-pay

## 2019-04-24 NOTE — Telephone Encounter (Signed)
She does need evaluation  Please send for records after she is seen if possible

## 2019-04-24 NOTE — Telephone Encounter (Signed)
Pt said having frequency of urine; cold chills, and nauseated; pt said she feels really bad. No fever. No available appts this afternoon at [facility]. Pt has no covid symptoms except h/a, chills, lose stool on 04/21/19., no travel and no known exposure to + covid. Pt does not want to go to UC; pt wants appt on 04/25/19. Was going to schedule virtual appt and pt said she did not want to go thru all of that and pt said she will go to UC in [place] today. FYI to Dr Glori Bickers.

## 2019-04-25 NOTE — Telephone Encounter (Signed)
Let her know that the azo helps symptoms but does not cure - she will probably need to be seen as well  Thanks

## 2019-04-25 NOTE — Telephone Encounter (Signed)
Called twice and both times the line was busy

## 2019-04-25 NOTE — Telephone Encounter (Signed)
Pt was advise and she said she will "just see what happens"

## 2019-04-25 NOTE — Telephone Encounter (Signed)
Called pt and she said she didn't go to a UC she said she felt "really bad" last night but didn't want to go to a UC so her daughter brought her AZO pills today and she felt better today. I did advise pt if sxs come back or worsen she really needs to go to a UC to rule out a UTI but will send note back to Dr. Glori Bickers as an Juluis Rainier

## 2019-04-27 ENCOUNTER — Ambulatory Visit
Admission: EM | Admit: 2019-04-27 | Discharge: 2019-04-27 | Disposition: A | Discharge status: Home / Self Care | Payer: Medicare Other | Attending: Physician Assistant | Admitting: Physician Assistant

## 2019-04-27 ENCOUNTER — Other Ambulatory Visit: Payer: Self-pay

## 2019-04-27 DIAGNOSIS — N3090 Cystitis, unspecified without hematuria: Secondary | ICD-10-CM | POA: Insufficient documentation

## 2019-04-27 LAB — POCT URINALYSIS DIP (MANUAL ENTRY)
Bilirubin, UA: NEGATIVE
Glucose, UA: NEGATIVE mg/dL
Ketones, POC UA: NEGATIVE mg/dL
Nitrite, UA: POSITIVE — AB
Spec Grav, UA: 1.02 (ref 1.010–1.025)
Urobilinogen, UA: 0.2 E.U./dL
pH, UA: 7 (ref 5.0–8.0)

## 2019-04-27 MED ORDER — CEPHALEXIN 500 MG PO CAPS: 500.0000 mg | ORAL_CAPSULE | Freq: Four times a day (QID) | ORAL | 0 refills | Status: DC

## 2019-04-27 NOTE — ED Notes (Signed)
Patient able to ambulate independently  

## 2019-04-27 NOTE — ED Triage Notes (Signed)
Pt. States she has had UTI symptoms, chiils, pelvic pain since Wednesday and has a hx of UTI's.

## 2019-04-27 NOTE — ED Provider Notes (Signed)
[facility]    CSN: [id number] Arrival date & time: 04/27/19  1306      History   Chief Complaint Chief Complaint  Patient presents with  . Urinary Tract Infection    HPI Deborah Gardner is a 73 y.o. female.   73 year old female comes in for few day history of urinary symptoms with chills. Patient states she has urinary symptoms including frequency at baseline, and sees an urologist. She had tried oxybutynin for symptoms without relief in the past. States continues with urinary symptoms, but has darker urine, and "know there is an infection". She denies abdominal pain, states she just "feels like there is an infection in there". Denies nausea, vomiting. States had chills last night without known fever. Has had some right sided back pain, when asked to describe pain, patient states "it just feels like its infected. H/o hysterectomy, denies any vaginal symptoms such as discharge, itching.      Past Medical History:  Diagnosis Date  . Allergic rhinitis   . Allergy   . Arthritis   . Cataract    bil  . Colon polyps   . Esophagitis   . GERD (gastroesophageal reflux disease)   . Glaucoma   . HH (hiatus hernia)   . HTN (hypertension)   . Hyperlipidemia   . Increased pressure in the eye   . OA (osteoarthritis)    hands  . Osteoporosis   . Rectal pain     Patient Active Problem List   Diagnosis Date Noted  . Cellulitis of right axilla 04/11/2019  . Nasal congestion 01/21/2019  . Estrogen deficiency 05/07/2018  . Pelvic prolapse 05/07/2018  . Overactive bladder 05/07/2018  . Memory change 11/03/2016  . Osteoarthritis of right knee 05/22/2016  . Routine general medical examination at a health care facility 12/02/2014  . Encounter for Medicare annual wellness exam 11/21/2013  . Anxiety 11/21/2013  . Subclinical hyperthyroidism 06/18/2012  . Pruritus ani 03/08/2011  . UNSPECIFIED ESOPHAGITIS 08/12/2010  . OSTEOARTHRITIS, GENERALIZED, HAND 03/21/2010  .  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 ROTATOR CUFF SYNDROME, LEFT 03/21/2010  . ALLERGIC RHINITIS 11/03/2008  . ARTHRALGIA 10/08/2008  . ADENOMATOUS COLONIC POLYP 08/19/2007  . ESOPHAGITIS, REFLUX 08/19/2007  . HIATAL HERNIA 08/19/2007  . Hyperlipidemia 03/05/2007  . G E R D 03/05/2007  . Osteopenia 03/05/2007  . Essential hypertension 02/08/2007  . IBS 02/08/2007  . FIBROCYSTIC BREAST DISEASE 02/08/2007  . OSTEOARTHRITIS 02/08/2007  . [place] DISEASE, LUMBAR 02/08/2007  . STRESS INCONTINENCE 02/08/2007    Past Surgical History:  Procedure Laterality Date  . ABDOMINAL HYSTERECTOMY     still has ovaries  . APPENDECTOMY    . BREAST CYST ASPIRATION  1/03  . CATARACT EXTRACTION  9/07  . ESOPHAGOGASTRODUODENOSCOPY  11/08  . HEMORRHOID SURGERY    . rectal sphincterotomy    . Retinal laser surgery    . UPPER GASTROINTESTINAL ENDOSCOPY      OB History   No obstetric history on file.      Home Medications    Prior to Admission medications   Medication Sig Start Date End Date Taking? Authorizing Provider  amLODipine-benazepril (LOTREL) 5-10 MG capsule TAKE 1 CAPSULE BY MOUTH EVERY DAY 02/12/19   Tower, Wynelle [name], MD  Apoaequorin (PREVAGEN PO) Take by mouth.    [provider]  aspirin 81 MG tablet Take 81 mg by mouth daily.    [provider]  Calcium Carb-Cholecalciferol (CALCIUM 600 + D PO) Take 1 tablet  by mouth daily.    [provider]  cephALEXin (KEFLEX) 500 MG capsule Take 1 capsule (500 mg total) by mouth 4 (four) times daily. 04/27/19   Tasia Catchings, Amy V, PA-C  Cholecalciferol (VITAMIN D) 1000 UNITS capsule Take 1,000 Units by mouth daily.     [provider]  fluticasone (FLONASE) 50 MCG/ACT nasal spray PLACE 2 SPRAYS INTO THE NOSE DAILY. 09/17/18   Tower, Wynelle [name], MD  omeprazole (PRILOSEC) 20 MG capsule TAKE ONE CAPSULE BY MOUTH once a DAY BEFORE A MEAL 02/12/19   Tower, [place] A, MD  rosuvastatin (CRESTOR) 10 MG tablet Take 1 tablet (10 mg total)  by mouth daily. 02/12/19   Tower, Wynelle [name], MD  timolol (TIMOPTIC) 0.5 % ophthalmic solution Place 1 drop into both eyes 2 (two) times daily.  01/24/13   [provider]    Family History Family History  Problem Relation Age of Onset  . Colon cancer Father   . Pneumonia Father   . Thyroid disease Mother   . Depression Mother   . Hypertension Mother   . Osteoporosis Mother   . Liver disease Brother        from alcohol  . Breast cancer Paternal Aunt   . Alcohol abuse Brother        terminal   . Alcohol abuse Brother   . Thyroid disease Other        half sister  . Colon cancer Other        Aunt  . Breast cancer Maternal Aunt   . Breast cancer Paternal Aunt   . Colon polyps Neg Hx   . Esophageal cancer Neg Hx   . Stomach cancer Neg Hx   . Rectal cancer Neg Hx     Social History Social History   Tobacco Use  . Smoking status: Never Smoker  . Smokeless tobacco: Never Used  Substance Use Topics  . Alcohol use: No    Alcohol/week: 0.0 standard drinks  . Drug use: No     Allergies   Atorvastatin   Review of Systems Review of Systems  Reason unable to perform ROS: See HPI as above.     Physical Exam Triage Vital Signs ED Triage Vitals  Enc Vitals Group     BP 04/27/19 1319 (!) 146/86     Pulse Rate 04/27/19 1319 77     Resp --      Temp 04/27/19 1319 97.8 F (36.6 C)     Temp Source 04/27/19 1319 Oral     SpO2 04/27/19 1319 97 %     Weight 04/27/19 1324 151 lb (68.5 kg)     Height --      Head Circumference --      Peak Flow --      Pain Score 04/27/19 1323 0     Pain Loc --      Pain Edu? --      Excl. in [place]? --    No data found.  Updated Vital Signs BP (!) 146/86 (BP Location: Left Arm)   Pulse 77   Temp 97.8 F (36.6 C) (Oral)   Wt 151 lb (68.5 kg)   SpO2 97%   BMI 28.53 kg/m   Physical Exam Constitutional:      General: She is not in acute distress.    Appearance: She is well-developed. She is not ill-appearing, toxic-appearing  or diaphoretic.  HENT:     Head: Normocephalic and atraumatic.  Eyes:  Conjunctiva/sclera: Conjunctivae normal.     Pupils: Pupils are equal, round, and reactive to light.  Cardiovascular:     Rate and Rhythm: Normal rate and regular rhythm.     Heart sounds: Normal heart sounds. No murmur. No friction rub. No gallop.   Pulmonary:     Effort: Pulmonary effort is normal.     Breath sounds: Normal breath sounds. No wheezing or rales.  Abdominal:     General: Bowel sounds are normal.     Palpations: Abdomen is soft.     Tenderness: There is no abdominal tenderness. There is no right CVA tenderness, left CVA tenderness, guarding or rebound.  Skin:    General: Skin is warm and dry.  Neurological:     Mental Status: She is alert and oriented to person, place, and time.  Psychiatric:        Behavior: Behavior normal.        Judgment: Judgment normal.     UC Treatments / Results  Labs (all labs ordered are listed, but only abnormal results are displayed) Labs Reviewed  POCT URINALYSIS DIP (MANUAL ENTRY) - Abnormal; Notable for the following components:      Result Value   Clarity, UA cloudy (*)    Blood, UA trace-intact (*)    Protein Ur, POC trace (*)    Nitrite, UA Positive (*)    Leukocytes, UA Large (3+) (*)    All other components within normal limits  URINE CULTURE    EKG   Radiology No results found.  Procedures Procedures (including critical care time)  Medications Ordered in UC Medications - No data to display  Initial Impression / Assessment and Plan / UC Course  I have reviewed the triage vital signs and the nursing notes.  Pertinent labs & imaging results that were available during my care of the patient were reviewed by me and considered in my medical decision making (see chart for details).    Discussed with patient, given recent use of AZO, urine dipstick results may be altered.  Will send for urine culture.  However, given patient with urinary  symptoms, 73 years old, will cover for cystitis with Keflex.  Patient did have Keflex 04/11/2019 due to cellulitis.  She did not finish the course.  Has not had the medication for at least 1 to 2 weeks.  Will have patient start Keflex at this time, and will use urine culture results to guide for any changes needed. Return precautions given.  Patient expresses understanding and agrees to plan.  Final Clinical Impressions(s) / UC Diagnoses   Final diagnoses:  Cystitis   ED Prescriptions    Medication Sig Dispense Auth. Provider   cephALEXin (KEFLEX) 500 MG capsule Take 1 capsule (500 mg total) by mouth 4 (four) times daily. 28 capsule Ok Edwards, PA-C     PDMP not reviewed this encounter.   Ok Edwards, PA-C 04/27/19 1614

## 2019-04-27 NOTE — Discharge Instructions (Signed)
Start keflex 4 times a day for 7 days. We also sent urine culture to make sure what bacteria is causing your symptoms. Keep hydrated, urine should be clear to pale yellow in color. Please inform your PCP of current results. If noticing worsening symptoms, worsening back pain, nausea/vomiting, fever, confusion/weakness, go to the ED for further evaluation needed.

## 2019-04-30 LAB — URINE CULTURE: Culture: >=100000 — AB

## 2019-05-01 ENCOUNTER — Telehealth (HOSPITAL_COMMUNITY): Payer: Self-pay | Admitting: Emergency Medicine

## 2019-05-01 MED ORDER — NITROFURANTOIN MONOHYD MACRO 100 MG PO CAPS: 100.0000 mg | ORAL_CAPSULE | Freq: Two times a day (BID) | ORAL | 0 refills | Status: DC

## 2019-05-01 NOTE — Telephone Encounter (Signed)
Pt called stating she was still having frequency urination after taking keflex. Reviewed chart with Dr. Meda Coffee, will switch to macrobid and follow up if not feeling better. Pt agreeable to plan.

## 2019-05-06 ENCOUNTER — Other Ambulatory Visit: Payer: Medicare Other

## 2019-05-09 ENCOUNTER — Other Ambulatory Visit: Payer: Self-pay | Admitting: Family Medicine

## 2019-05-13 ENCOUNTER — Ambulatory Visit: Payer: Medicare Other

## 2019-05-13 ENCOUNTER — Encounter: Payer: Medicare Other | Admitting: Family Medicine

## 2019-07-11 HISTORY — PX: KNEE ARTHROSCOPY WITH MENISCAL REPAIR: SHX5653

## 2019-08-11 ENCOUNTER — Ambulatory Visit (INDEPENDENT_AMBULATORY_CARE_PROVIDER_SITE_OTHER): Payer: Medicare PPO | Admitting: Family Medicine

## 2019-08-11 ENCOUNTER — Encounter: Payer: Self-pay | Admitting: Family Medicine

## 2019-08-11 ENCOUNTER — Other Ambulatory Visit: Payer: Self-pay

## 2019-08-11 VITALS — BP 128/78 | HR 63 | Temp 97.0°F | Ht 61.0 in | Wt 156.1 lb

## 2019-08-11 DIAGNOSIS — R350 Frequency of micturition: Secondary | ICD-10-CM | POA: Diagnosis not present

## 2019-08-11 DIAGNOSIS — N3000 Acute cystitis without hematuria: Secondary | ICD-10-CM | POA: Diagnosis not present

## 2019-08-11 DIAGNOSIS — N390 Urinary tract infection, site not specified: Secondary | ICD-10-CM | POA: Insufficient documentation

## 2019-08-11 LAB — POC URINALSYSI DIPSTICK (AUTOMATED)
Blood, UA: 25
Glucose, UA: NEGATIVE
Nitrite, UA: NEGATIVE
Protein, UA: POSITIVE — AB
Spec Grav, UA: 1.02 (ref 1.010–1.025)
Urobilinogen, UA: 0.2 E.U./dL
pH, UA: 6 (ref 5.0–8.0)

## 2019-08-11 MED ORDER — NITROFURANTOIN MONOHYD MACRO 100 MG PO CAPS: 100.0000 mg | ORAL_CAPSULE | Freq: Two times a day (BID) | ORAL | 0 refills | Status: DC

## 2019-08-11 NOTE — Assessment & Plan Note (Signed)
Pos ua with voiding symptoms  Also overactive bladder  Has seen urology tx with macrobid Culture pending  inst to call if symptoms suddenly worsen  Enc better water intake Handout given

## 2019-08-11 NOTE — Progress Notes (Signed)
Subjective:    Patient ID: Deborah Gardner, female    DOB: December 30, 1945, 74 y.o.   MRN: [id number]  HPI Pt presents with urinary symptoms   Urgency and frequency of urination  Bladder discomfort  A discomfort when she urinates (feels like a shiver or chill)  No nausea No fever  Some odor   Pain in back -moves around  Goes from flank to low back   No blood in urine that she can see   Some vaginal itch-on and off /occ  Did not treat that   Ua: positive  Results for orders placed or performed in visit on 08/11/19  POCT Urinalysis Dipstick (Automated)  Result Value Ref Range   Color, UA Yellow    Clarity, UA Cloudy    Glucose, UA Negative Negative   Bilirubin, UA Trace    Ketones, UA 5mg /dL    Spec Grav, UA 1.020 1.010 - 1.025   Blood, UA 25 Ery/uL    pH, UA 6.0 5.0 - 8.0   Protein, UA Positive (A) Negative   Urobilinogen, UA 0.2 0.2 or 1.0 E.U./dL   Nitrite, UA Negative    Leukocytes, UA Moderate (2+) (A) Negative     Last uti e coli in oct  tx with keflex and then macrobid  She has h/o pelvic prolapse and overactive bladder as well  Wrestling with symptoms since October   Lab Results  Component Value Date   CREATININE 0.70 02/10/2019   BUN 13 02/10/2019   NA 140 02/10/2019   K 4.9 02/10/2019   CL 104 02/10/2019   CO2 27 02/10/2019    Patient Active Problem List   Diagnosis Date Noted  . UTI (urinary tract infection) 08/11/2019  . Cellulitis of right axilla 04/11/2019  . Nasal congestion 01/21/2019  . Estrogen deficiency 05/07/2018  . Pelvic prolapse 05/07/2018  . Overactive bladder 05/07/2018  . Memory change 11/03/2016  . Osteoarthritis of right knee 05/22/2016  . Routine general medical examination at a health care facility 12/02/2014  . Encounter for Medicare annual wellness exam 11/21/2013  . Anxiety 11/21/2013  . Subclinical hyperthyroidism 06/18/2012  . Pruritus ani 03/08/2011  . UNSPECIFIED ESOPHAGITIS 08/12/2010  . OSTEOARTHRITIS,  GENERALIZED, HAND 03/21/2010  . 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 ROTATOR CUFF SYNDROME, LEFT 03/21/2010  . ALLERGIC RHINITIS 11/03/2008  . ARTHRALGIA 10/08/2008  . ADENOMATOUS COLONIC POLYP 08/19/2007  . ESOPHAGITIS, REFLUX 08/19/2007  . HIATAL HERNIA 08/19/2007  . Hyperlipidemia 03/05/2007  . G E R D 03/05/2007  . Osteopenia 03/05/2007  . Essential hypertension 02/08/2007  . IBS 02/08/2007  . FIBROCYSTIC BREAST DISEASE 02/08/2007  . OSTEOARTHRITIS 02/08/2007  . [place] DISEASE, LUMBAR 02/08/2007  . STRESS INCONTINENCE 02/08/2007   Past Medical History:  Diagnosis Date  . Allergic rhinitis   . Allergy   . Arthritis   . Cataract    bil  . Colon polyps   . Esophagitis   . GERD (gastroesophageal reflux disease)   . Glaucoma   . HH (hiatus hernia)   . HTN (hypertension)   . Hyperlipidemia   . Increased pressure in the eye   . OA (osteoarthritis)    hands  . Osteoporosis   . Rectal pain    Past Surgical History:  Procedure Laterality Date  . ABDOMINAL HYSTERECTOMY     still has ovaries  . APPENDECTOMY    . BREAST CYST ASPIRATION  1/03  . CATARACT EXTRACTION  9/07  . ESOPHAGOGASTRODUODENOSCOPY  11/08  . HEMORRHOID SURGERY    .  rectal sphincterotomy    . Retinal laser surgery    . UPPER GASTROINTESTINAL ENDOSCOPY     Social History   Tobacco Use  . Smoking status: Never Smoker  . Smokeless tobacco: Never Used  Substance Use Topics  . Alcohol use: No    Alcohol/week: 0.0 standard drinks  . Drug use: No   Family History  Problem Relation Age of Onset  . Colon cancer Father   . Pneumonia Father   . Thyroid disease Mother   . Depression Mother   . Hypertension Mother   . Osteoporosis Mother   . Liver disease Brother        from alcohol  . Breast cancer Paternal Aunt   . Alcohol abuse Brother        terminal   . Alcohol abuse Brother   . Thyroid disease Other        half sister  . Colon cancer Other        Aunt  . Breast cancer Maternal Aunt   .  Breast cancer Paternal Aunt   . Colon polyps Neg Hx   . Esophageal cancer Neg Hx   . Stomach cancer Neg Hx   . Rectal cancer Neg Hx    Allergies  Allergen Reactions  . Atorvastatin Other (See Comments)    Memory loss  . Sulfa Antibiotics     Lip swelling   Current Outpatient Medications on File Prior to Visit  Medication Sig Dispense Refill  . amLODipine-benazepril (LOTREL) 5-10 MG capsule TAKE 1 CAPSULE BY MOUTH EVERY DAY 90 capsule 3  . aspirin 81 MG tablet Take 81 mg by mouth daily.    . Calcium Carb-Cholecalciferol (CALCIUM 600 + D PO) Take 1 tablet by mouth daily.    . Cholecalciferol (VITAMIN D) 1000 UNITS capsule Take 1,000 Units by mouth daily.     . fluticasone (FLONASE) 50 MCG/ACT nasal spray PLACE 2 SPRAYS INTO THE NOSE DAILY. 48 mL 2  . omeprazole (PRILOSEC) 20 MG capsule TAKE ONE CAPSULE BY MOUTH once a DAY BEFORE A MEAL 90 capsule 3  . rosuvastatin (CRESTOR) 10 MG tablet Take 1 tablet (10 mg total) by mouth daily. 90 tablet 3  . timolol (TIMOPTIC) 0.5 % ophthalmic solution Place 1 drop into both eyes 2 (two) times daily.      No current facility-administered medications on file prior to visit.    Review of Systems  Constitutional: Negative for activity change, appetite change, fatigue, fever and unexpected weight change.  HENT: Negative for congestion, ear pain, rhinorrhea, sinus pressure and sore throat.   Eyes: Negative for pain, redness and visual disturbance.  Respiratory: Negative for cough, shortness of breath and wheezing.   Cardiovascular: Negative for chest pain and palpitations.  Gastrointestinal: Negative for abdominal pain, blood in stool, constipation and diarrhea.  Endocrine: Negative for polydipsia and polyuria.  Genitourinary: Positive for dysuria, frequency and urgency. Negative for decreased urine volume and flank pain.       No vaginal d/c occ vaginal itching   Musculoskeletal: Positive for back pain. Negative for arthralgias and myalgias.  Skin:  Negative for pallor and rash.  Allergic/Immunologic: Negative for environmental allergies.  Neurological: Negative for dizziness, syncope and headaches.  Hematological: Negative for adenopathy. Does not bruise/bleed easily.  Psychiatric/Behavioral: Negative for decreased concentration and dysphoric mood. The patient is not nervous/anxious.        Objective:   Physical Exam Constitutional:      General: She is not in acute distress.  Appearance: Normal appearance. She is well-developed. She is obese. She is not ill-appearing.  HENT:     Head: Normocephalic and atraumatic.     Mouth/Throat:     Mouth: Mucous membranes are moist.  Eyes:     Conjunctiva/sclera: Conjunctivae normal.     Pupils: Pupils are equal, round, and reactive to light.  Cardiovascular:     Rate and Rhythm: Normal rate and regular rhythm.     Heart sounds: Normal heart sounds.  Pulmonary:     Effort: Pulmonary effort is normal.     Breath sounds: Normal breath sounds.  Abdominal:     General: Bowel sounds are normal. There is no distension.     Palpations: Abdomen is soft.     Tenderness: There is abdominal tenderness. There is no rebound.     Comments: No cva tenderness  Mild suprapubic tenderness (per pt feels full)  Bladder does not feel distended to palpation   Musculoskeletal:     Cervical back: Normal range of motion and neck supple.     Right lower leg: No edema.     Left lower leg: No edema.  Lymphadenopathy:     Cervical: No cervical adenopathy.  Skin:    Findings: No rash.  Neurological:     Mental Status: She is alert.  Psychiatric:        Mood and Affect: Mood normal.           Assessment & Plan:   Problem List Items Addressed This Visit      Genitourinary   UTI (urinary tract infection) - Primary    Pos ua with voiding symptoms  Also overactive bladder  Has seen urology tx with macrobid Culture pending  inst to call if symptoms suddenly worsen  Enc better water  intake Handout given        Relevant Medications   nitrofurantoin, macrocrystal-monohydrate, (MACROBID) 100 MG capsule   Other Relevant Orders   Urine Culture    Other Visit Diagnoses    Urinary frequency       Relevant Orders   POCT Urinalysis Dipstick (Automated) (Completed)

## 2019-08-11 NOTE — Patient Instructions (Addendum)
Drink lots of fluids  Take the generic macrobid as directed for uti  I sent your urine for culture  We will call you when that returns    Urinary Tract Infection, Adult  A urinary tract infection (UTI) is an infection of any part of the urinary tract. The urinary tract includes the kidneys, ureters, bladder, and urethra. These organs make, store, and get rid of urine in the body. Your health care provider may use other names to describe the infection. An upper UTI affects the ureters and kidneys (pyelonephritis). A lower UTI affects the bladder (cystitis) and urethra (urethritis). What are the causes? Most urinary tract infections are caused by bacteria in your genital area, around the entrance to your urinary tract (urethra). These bacteria grow and cause inflammation of your urinary tract. What increases the risk? You are more likely to develop this condition if:  You have a urinary catheter that stays in place (indwelling).  You are not able to control when you urinate or have a bowel movement (you have incontinence).  You are female and you: ? Use a spermicide or diaphragm for birth control. ? Have low estrogen levels. ? Are pregnant.  You have certain genes that increase your risk (genetics).  You are sexually active.  You take antibiotic medicines.  You have a condition that causes your flow of urine to slow down, such as: ? An enlarged prostate, if you are female. ? Blockage in your urethra (stricture). ? A kidney stone. ? A nerve condition that affects your bladder control (neurogenic bladder). ? Not getting enough to drink, or not urinating often.  You have certain medical conditions, such as: ? Diabetes. ? A weak disease-fighting system (immunesystem). ? Sickle cell disease. ? Gout. ? Spinal cord injury. What are the signs or symptoms? Symptoms of this condition include:  Needing to urinate right away (urgently).  Frequent urination or passing small amounts of  urine frequently.  Pain or burning with urination.  Blood in the urine.  Urine that smells bad or unusual.  Trouble urinating.  Cloudy urine.  Vaginal discharge, if you are female.  Pain in the abdomen or the lower back. You may also have:  Vomiting or a decreased appetite.  Confusion.  Irritability or tiredness.  A fever.  Diarrhea. The first symptom in older adults may be confusion. In some cases, they may not have any symptoms until the infection has worsened. How is this diagnosed? This condition is diagnosed based on your medical history and a physical exam. You may also have other tests, including:  Urine tests.  Blood tests.  Tests for sexually transmitted infections (STIs). If you have had more than one UTI, a cystoscopy or imaging studies may be done to determine the cause of the infections. How is this treated? Treatment for this condition includes:  Antibiotic medicine.  Over-the-counter medicines to treat discomfort.  Drinking enough water to stay hydrated. If you have frequent infections or have other conditions such as a kidney stone, you may need to see a health care provider who specializes in the urinary tract (urologist). In rare cases, urinary tract infections can cause sepsis. Sepsis is a life-threatening condition that occurs when the body responds to an infection. Sepsis is treated in the hospital with IV antibiotics, fluids, and other medicines. Follow these instructions at home:  Medicines  Take over-the-counter and prescription medicines only as told by your health care provider.  If you were prescribed an antibiotic medicine, take it as  told by your health care provider. Do not stop using the antibiotic even if you start to feel better. General instructions  Make sure you: ? Empty your bladder often and completely. Do not hold urine for long periods of time. ? Empty your bladder after sex. ? Wipe from front to back after a bowel  movement if you are female. Use each tissue one time when you wipe.  Drink enough fluid to keep your urine pale yellow.  Keep all follow-up visits as told by your health care provider. This is important. Contact a health care provider if:  Your symptoms do not get better after 1-2 days.  Your symptoms go away and then return. Get help right away if you have:  Severe pain in your back or your lower abdomen.  A fever.  Nausea or vomiting. Summary  A urinary tract infection (UTI) is an infection of any part of the urinary tract, which includes the kidneys, ureters, bladder, and urethra.  Most urinary tract infections are caused by bacteria in your genital area, around the entrance to your urinary tract (urethra).  Treatment for this condition often includes antibiotic medicines.  If you were prescribed an antibiotic medicine, take it as told by your health care provider. Do not stop using the antibiotic even if you start to feel better.  Keep all follow-up visits as told by your health care provider. This is important. This information is not intended to replace advice given to you by your health care provider. Make sure you discuss any questions you have with your health care provider. Document Revised: 06/13/2018 Document Reviewed: 01/03/2018 Elsevier Patient Education  2020 [employer].

## 2019-08-13 LAB — URINE CULTURE
MICRO NUMBER:: 10102016
SPECIMEN QUALITY:: ADEQUATE

## 2019-08-14 ENCOUNTER — Telehealth: Payer: Self-pay | Admitting: *Deleted

## 2019-08-14 ENCOUNTER — Other Ambulatory Visit: Payer: Self-pay

## 2019-08-14 ENCOUNTER — Other Ambulatory Visit (INDEPENDENT_AMBULATORY_CARE_PROVIDER_SITE_OTHER): Payer: Medicare PPO

## 2019-08-14 DIAGNOSIS — R3915 Urgency of urination: Secondary | ICD-10-CM | POA: Diagnosis not present

## 2019-08-14 NOTE — Telephone Encounter (Signed)
-----   Message from Abner Greenspan, MD sent at 08/14/2019  1:24 PM EST ----- Hold the abx for now  Send in diflucan 150 mg 1 po times one for likely yeast infection #1 no refills   We will see how the urine culture looks

## 2019-08-14 NOTE — Telephone Encounter (Signed)
Left VM requesting pt to call the office back 

## 2019-08-15 LAB — URINE CULTURE
MICRO NUMBER:: 10116792
Result:: NO GROWTH
SPECIMEN QUALITY:: ADEQUATE

## 2019-08-15 MED ORDER — FLUCONAZOLE 150 MG PO TABS: 150.0000 mg | ORAL_TABLET | Freq: Once | ORAL | 0 refills | Status: AC

## 2019-08-15 NOTE — Telephone Encounter (Signed)
Pt notified of Dr. Tower's comments and Rx sent to pharmacy  

## 2019-08-15 NOTE — Telephone Encounter (Signed)
-----   Message from Abner Greenspan, MD sent at 08/14/2019  1:24 PM EST ----- Hold the abx for now  Send in diflucan 150 mg 1 po times one for likely yeast infection #1 no refills   We will see how the urine culture looks

## 2019-09-02 DIAGNOSIS — M9902 Segmental and somatic dysfunction of thoracic region: Secondary | ICD-10-CM | POA: Diagnosis not present

## 2019-09-02 DIAGNOSIS — M5136 Other intervertebral disc degeneration, lumbar region: Secondary | ICD-10-CM | POA: Diagnosis not present

## 2019-09-02 DIAGNOSIS — M9903 Segmental and somatic dysfunction of lumbar region: Secondary | ICD-10-CM | POA: Diagnosis not present

## 2019-09-02 DIAGNOSIS — M5134 Other intervertebral disc degeneration, thoracic region: Secondary | ICD-10-CM | POA: Diagnosis not present

## 2019-09-04 DIAGNOSIS — M9902 Segmental and somatic dysfunction of thoracic region: Secondary | ICD-10-CM | POA: Diagnosis not present

## 2019-09-04 DIAGNOSIS — M5136 Other intervertebral disc degeneration, lumbar region: Secondary | ICD-10-CM | POA: Diagnosis not present

## 2019-09-04 DIAGNOSIS — M9903 Segmental and somatic dysfunction of lumbar region: Secondary | ICD-10-CM | POA: Diagnosis not present

## 2019-09-04 DIAGNOSIS — M5134 Other intervertebral disc degeneration, thoracic region: Secondary | ICD-10-CM | POA: Diagnosis not present

## 2019-09-10 DIAGNOSIS — M9903 Segmental and somatic dysfunction of lumbar region: Secondary | ICD-10-CM | POA: Diagnosis not present

## 2019-09-10 DIAGNOSIS — M9902 Segmental and somatic dysfunction of thoracic region: Secondary | ICD-10-CM | POA: Diagnosis not present

## 2019-09-10 DIAGNOSIS — M5136 Other intervertebral disc degeneration, lumbar region: Secondary | ICD-10-CM | POA: Diagnosis not present

## 2019-09-10 DIAGNOSIS — M5134 Other intervertebral disc degeneration, thoracic region: Secondary | ICD-10-CM | POA: Diagnosis not present

## 2019-09-17 DIAGNOSIS — M9903 Segmental and somatic dysfunction of lumbar region: Secondary | ICD-10-CM | POA: Diagnosis not present

## 2019-09-17 DIAGNOSIS — M5136 Other intervertebral disc degeneration, lumbar region: Secondary | ICD-10-CM | POA: Diagnosis not present

## 2019-09-17 DIAGNOSIS — M9902 Segmental and somatic dysfunction of thoracic region: Secondary | ICD-10-CM | POA: Diagnosis not present

## 2019-09-17 DIAGNOSIS — M5134 Other intervertebral disc degeneration, thoracic region: Secondary | ICD-10-CM | POA: Diagnosis not present

## 2019-09-24 DIAGNOSIS — M5134 Other intervertebral disc degeneration, thoracic region: Secondary | ICD-10-CM | POA: Diagnosis not present

## 2019-09-24 DIAGNOSIS — M9903 Segmental and somatic dysfunction of lumbar region: Secondary | ICD-10-CM | POA: Diagnosis not present

## 2019-09-24 DIAGNOSIS — M5136 Other intervertebral disc degeneration, lumbar region: Secondary | ICD-10-CM | POA: Diagnosis not present

## 2019-09-24 DIAGNOSIS — M9902 Segmental and somatic dysfunction of thoracic region: Secondary | ICD-10-CM | POA: Diagnosis not present

## 2019-09-30 DIAGNOSIS — M5134 Other intervertebral disc degeneration, thoracic region: Secondary | ICD-10-CM | POA: Diagnosis not present

## 2019-09-30 DIAGNOSIS — M9903 Segmental and somatic dysfunction of lumbar region: Secondary | ICD-10-CM | POA: Diagnosis not present

## 2019-09-30 DIAGNOSIS — M5136 Other intervertebral disc degeneration, lumbar region: Secondary | ICD-10-CM | POA: Diagnosis not present

## 2019-09-30 DIAGNOSIS — M9902 Segmental and somatic dysfunction of thoracic region: Secondary | ICD-10-CM | POA: Diagnosis not present

## 2019-10-04 DIAGNOSIS — Z811 Family history of alcohol abuse and dependence: Secondary | ICD-10-CM | POA: Diagnosis not present

## 2019-10-04 DIAGNOSIS — Z809 Family history of malignant neoplasm, unspecified: Secondary | ICD-10-CM | POA: Diagnosis not present

## 2019-10-04 DIAGNOSIS — M810 Age-related osteoporosis without current pathological fracture: Secondary | ICD-10-CM | POA: Diagnosis not present

## 2019-10-04 DIAGNOSIS — J309 Allergic rhinitis, unspecified: Secondary | ICD-10-CM | POA: Diagnosis not present

## 2019-10-04 DIAGNOSIS — K219 Gastro-esophageal reflux disease without esophagitis: Secondary | ICD-10-CM | POA: Diagnosis not present

## 2019-10-04 DIAGNOSIS — Z79899 Other long term (current) drug therapy: Secondary | ICD-10-CM | POA: Diagnosis not present

## 2019-10-04 DIAGNOSIS — M1990 Unspecified osteoarthritis, unspecified site: Secondary | ICD-10-CM | POA: Diagnosis not present

## 2019-10-04 DIAGNOSIS — G8929 Other chronic pain: Secondary | ICD-10-CM | POA: Diagnosis not present

## 2019-10-04 DIAGNOSIS — E785 Hyperlipidemia, unspecified: Secondary | ICD-10-CM | POA: Diagnosis not present

## 2019-10-04 DIAGNOSIS — K5900 Constipation, unspecified: Secondary | ICD-10-CM | POA: Diagnosis not present

## 2019-10-04 DIAGNOSIS — H40009 Preglaucoma, unspecified, unspecified eye: Secondary | ICD-10-CM | POA: Diagnosis not present

## 2019-10-04 DIAGNOSIS — I10 Essential (primary) hypertension: Secondary | ICD-10-CM | POA: Diagnosis not present

## 2019-10-04 DIAGNOSIS — Z8249 Family history of ischemic heart disease and other diseases of the circulatory system: Secondary | ICD-10-CM | POA: Diagnosis not present

## 2019-10-07 DIAGNOSIS — M9902 Segmental and somatic dysfunction of thoracic region: Secondary | ICD-10-CM | POA: Diagnosis not present

## 2019-10-07 DIAGNOSIS — M9903 Segmental and somatic dysfunction of lumbar region: Secondary | ICD-10-CM | POA: Diagnosis not present

## 2019-10-07 DIAGNOSIS — M5134 Other intervertebral disc degeneration, thoracic region: Secondary | ICD-10-CM | POA: Diagnosis not present

## 2019-10-07 DIAGNOSIS — M5136 Other intervertebral disc degeneration, lumbar region: Secondary | ICD-10-CM | POA: Diagnosis not present

## 2019-10-21 DIAGNOSIS — M5134 Other intervertebral disc degeneration, thoracic region: Secondary | ICD-10-CM | POA: Diagnosis not present

## 2019-10-21 DIAGNOSIS — M9902 Segmental and somatic dysfunction of thoracic region: Secondary | ICD-10-CM | POA: Diagnosis not present

## 2019-10-21 DIAGNOSIS — M5136 Other intervertebral disc degeneration, lumbar region: Secondary | ICD-10-CM | POA: Diagnosis not present

## 2019-10-21 DIAGNOSIS — M9903 Segmental and somatic dysfunction of lumbar region: Secondary | ICD-10-CM | POA: Diagnosis not present

## 2019-10-28 DIAGNOSIS — M5136 Other intervertebral disc degeneration, lumbar region: Secondary | ICD-10-CM | POA: Diagnosis not present

## 2019-10-28 DIAGNOSIS — M5134 Other intervertebral disc degeneration, thoracic region: Secondary | ICD-10-CM | POA: Diagnosis not present

## 2019-10-28 DIAGNOSIS — M9902 Segmental and somatic dysfunction of thoracic region: Secondary | ICD-10-CM | POA: Diagnosis not present

## 2019-10-28 DIAGNOSIS — M9903 Segmental and somatic dysfunction of lumbar region: Secondary | ICD-10-CM | POA: Diagnosis not present

## 2019-11-04 DIAGNOSIS — M9903 Segmental and somatic dysfunction of lumbar region: Secondary | ICD-10-CM | POA: Diagnosis not present

## 2019-11-04 DIAGNOSIS — M9902 Segmental and somatic dysfunction of thoracic region: Secondary | ICD-10-CM | POA: Diagnosis not present

## 2019-11-04 DIAGNOSIS — M5134 Other intervertebral disc degeneration, thoracic region: Secondary | ICD-10-CM | POA: Diagnosis not present

## 2019-11-04 DIAGNOSIS — M5136 Other intervertebral disc degeneration, lumbar region: Secondary | ICD-10-CM | POA: Diagnosis not present

## 2019-11-18 DIAGNOSIS — M5136 Other intervertebral disc degeneration, lumbar region: Secondary | ICD-10-CM | POA: Diagnosis not present

## 2019-11-18 DIAGNOSIS — M9905 Segmental and somatic dysfunction of pelvic region: Secondary | ICD-10-CM | POA: Diagnosis not present

## 2019-11-18 DIAGNOSIS — M9904 Segmental and somatic dysfunction of sacral region: Secondary | ICD-10-CM | POA: Diagnosis not present

## 2019-11-18 DIAGNOSIS — M9903 Segmental and somatic dysfunction of lumbar region: Secondary | ICD-10-CM | POA: Diagnosis not present

## 2019-12-05 DIAGNOSIS — R351 Nocturia: Secondary | ICD-10-CM | POA: Diagnosis not present

## 2019-12-05 DIAGNOSIS — N3946 Mixed incontinence: Secondary | ICD-10-CM | POA: Diagnosis not present

## 2019-12-05 DIAGNOSIS — R350 Frequency of micturition: Secondary | ICD-10-CM | POA: Diagnosis not present

## 2019-12-09 DIAGNOSIS — M9903 Segmental and somatic dysfunction of lumbar region: Secondary | ICD-10-CM | POA: Diagnosis not present

## 2019-12-09 DIAGNOSIS — M5124 Other intervertebral disc displacement, thoracic region: Secondary | ICD-10-CM | POA: Diagnosis not present

## 2019-12-09 DIAGNOSIS — M9904 Segmental and somatic dysfunction of sacral region: Secondary | ICD-10-CM | POA: Diagnosis not present

## 2019-12-09 DIAGNOSIS — M9905 Segmental and somatic dysfunction of pelvic region: Secondary | ICD-10-CM | POA: Diagnosis not present

## 2020-01-06 DIAGNOSIS — M9905 Segmental and somatic dysfunction of pelvic region: Secondary | ICD-10-CM | POA: Diagnosis not present

## 2020-01-06 DIAGNOSIS — M5124 Other intervertebral disc displacement, thoracic region: Secondary | ICD-10-CM | POA: Diagnosis not present

## 2020-01-06 DIAGNOSIS — M9903 Segmental and somatic dysfunction of lumbar region: Secondary | ICD-10-CM | POA: Diagnosis not present

## 2020-01-06 DIAGNOSIS — M9904 Segmental and somatic dysfunction of sacral region: Secondary | ICD-10-CM | POA: Diagnosis not present

## 2020-01-08 ENCOUNTER — Encounter: Payer: Self-pay | Admitting: Family Medicine

## 2020-01-08 ENCOUNTER — Other Ambulatory Visit: Payer: Self-pay

## 2020-01-08 ENCOUNTER — Ambulatory Visit: Payer: Medicare PPO | Admitting: Family Medicine

## 2020-01-08 VITALS — BP 138/74 | HR 60 | Temp 98.3°F | Ht 61.25 in | Wt 151.2 lb

## 2020-01-08 DIAGNOSIS — L239 Allergic contact dermatitis, unspecified cause: Secondary | ICD-10-CM | POA: Diagnosis not present

## 2020-01-08 DIAGNOSIS — L299 Pruritus, unspecified: Secondary | ICD-10-CM

## 2020-01-08 MED ORDER — HYDROXYZINE HCL 25 MG PO TABS: 25.0000 mg | ORAL_TABLET | Freq: Three times a day (TID) | ORAL | 0 refills | Status: DC | PRN

## 2020-01-08 MED ORDER — METHYLPREDNISOLONE 4 MG PO TBPK: ORAL_TABLET | ORAL | 0 refills | Status: DC

## 2020-01-08 NOTE — Progress Notes (Signed)
Chief Complaint  Patient presents with   Rash    bumps/hives for several months. Has worsened and is very itchy. Cannot sleep at night. Has tried rubbing alcohol, cortisone 10 and preparation H.   She states she had this happen in the past, saw her PCP, thought she had bug bites, and was treated with an antibiotic.  Didn't get better.  She then saw the dermatologist. Dr. Pearline Cables, and was put on a medrol dosepak, which took care of it.  She checked with the pharmacy yesterday, and saw that was rx'd in 05/2019.  This rash started around 2-2.5 weeks ago.  It started on the right side of her neck, then spread.  She has some on her R knee, ankle. She has been using cortisone 10 on the right neck (along with hemorrhoid cream), and those spots resolved, but now has more on the left side of the neck, and the entire L arm. It is very itchy, especially at night.  She has changed her bedsheets, hasn't seen any bugs. She has done a little bit of yardwork, but her family does most of it.  She has been taking benadryl, doesn't seem to do much, not even helping her sleep.  PMH, PSH, SH reviewed  Outpatient Encounter Medications as of 01/08/2020  Medication Sig   amLODipine-benazepril (LOTREL) 5-10 MG capsule TAKE 1 CAPSULE BY MOUTH EVERY DAY   aspirin 81 MG tablet Take 81 mg by mouth daily.   Calcium Carb-Cholecalciferol (CALCIUM 600 + D PO) Take 1 tablet by mouth daily.   omeprazole (PRILOSEC) 20 MG capsule TAKE ONE CAPSULE BY MOUTH once a DAY BEFORE A MEAL   rosuvastatin (CRESTOR) 10 MG tablet Take 1 tablet (10 mg total) by mouth daily.   timolol (TIMOPTIC) 0.5 % ophthalmic solution Place 1 drop into both eyes 2 (two) times daily.    [DISCONTINUED] Cholecalciferol (VITAMIN D) 1000 UNITS capsule Take 1,000 Units by mouth daily.    fluticasone (FLONASE) 50 MCG/ACT nasal spray PLACE 2 SPRAYS INTO THE NOSE DAILY. (Patient not taking: Reported on 01/08/2020)   [DISCONTINUED] nitrofurantoin,  macrocrystal-monohydrate, (MACROBID) 100 MG capsule Take 1 capsule (100 mg total) by mouth 2 (two) times daily.   No facility-administered encounter medications on file as of 01/08/2020.   Allergies  Allergen Reactions   Atorvastatin Other (See Comments)    Memory loss   Sulfa Antibiotics     Lip swelling    ROS:  No fever, chills, URI symptoms, cough, shortness of breath, wheezing, chest pain.  No vomiting, diarrhea (slight nausea over the weekend, short-lived, she relates it to the constant itching, affecting her nerves).   PHYSICAL EXAM:  BP 138/74    Pulse 60    Temp 98.3 F (36.8 C) (Tympanic)    Ht 5' 1.25" (1.556 m)    Wt 151 lb 3.2 oz (68.6 kg)    BMI 28.34 kg/m   Well-appearing elderly female, occasionally rubbing at her LUE, otherwise in no distress HEENT: conjunctiva and sclera are clear, EOMI, wearing mask Neck: no lymphadenopathy, thyromegaly Heart: regular rate and rhythm Lungs; clear, no wheezes or rales Back: clear, no rashes, nontender Skin: multiple papules (larger on the arm, smaller at the neck and scalp), scattered along the RUE--one over the dorsum of the left 3rd MCP, most on the anterior forearm, and anterior and posterior upper arm.  A few at the left neck and near the scalp. These do not appear urticarial. R posterior neck area is completely clear.  ASSESSMENT/PLAN:  Allergic contact dermatitis, unspecified trigger - ddx include insect bites (but lasting longer, and spreading), more likely other dermatitis (contact, poss plant) - Plan: methylPREDNISolone (MEDROL DOSEPAK) 4 MG TBPK tablet  Pruritic dermatitis - Plan: hydrOXYzine (ATARAX/VISTARIL) 25 MG tablet, methylPREDNISolone (MEDROL DOSEPAK) 4 MG TBPK tablet   Pruritic rash Appears to be either insect bites (but lasting too long) or contact dermatitis. Not vesicular. Not urticarial. Given the length of time, large area involved, and distress related to itching (and keeping her up at night), will  treat with a course of steroids. Risks/SE reviewed in detail. Avoidance measures reviewed.  F/u with regular doctor or dermatologist if symptoms persist or worsen depsite this treatment.  Hydroxyzine/atarax to use at bedtime Medrol dosepak Risks/SE of meds reviewed.  >30 min FTF visit, plus additional time in chart review and documentation.   Do NOT take benadryl. You may take a claritin or zyrtec once daily during the day if the steroids don't given an immediate response, to help some with the itching. Use the hydroxyzine at bedtime (similar to benadryl, should help with itching, might make you sleepy). Take the steroid course as directed. You may use 1% hydrocortisone 3 times daily if needed for itching. You can also use topical medications such as calamine, Sarna or other anti-itch lotions, if needed.   Cool compresses might help with the itching.

## 2020-01-08 NOTE — Patient Instructions (Signed)
  Do NOT take benadryl. We are switching you to hydroxyzine instead (and they are similar). You may take a claritin or zyrtec once daily during the day if the steroids don't given an immediate response, to help some with the itching. Use the hydroxyzine at bedtime (similar to benadryl, should help with itching, might make you sleepy). Take the steroid course as directed. You may use 1% hydrocortisone 3 times daily if needed for itching. You can also use topical medications such as calamine, Sarna or other anti-itch lotions, if needed.   Cool compresses might help with the itching.

## 2020-01-13 ENCOUNTER — Other Ambulatory Visit: Payer: Self-pay

## 2020-01-13 ENCOUNTER — Ambulatory Visit (INDEPENDENT_AMBULATORY_CARE_PROVIDER_SITE_OTHER): Payer: Medicare PPO | Admitting: Family Medicine

## 2020-01-13 ENCOUNTER — Encounter: Payer: Self-pay | Admitting: Family Medicine

## 2020-01-13 VITALS — BP 138/80 | HR 60 | Temp 95.4°F | Resp 18 | Ht 61.0 in | Wt 149.7 lb

## 2020-01-13 DIAGNOSIS — F5104 Psychophysiologic insomnia: Secondary | ICD-10-CM

## 2020-01-13 DIAGNOSIS — L299 Pruritus, unspecified: Secondary | ICD-10-CM

## 2020-01-13 DIAGNOSIS — L509 Urticaria, unspecified: Secondary | ICD-10-CM | POA: Insufficient documentation

## 2020-01-13 MED ORDER — ESCITALOPRAM OXALATE 10 MG PO TABS: 10.0000 mg | ORAL_TABLET | Freq: Every day | ORAL | 3 refills | Status: DC

## 2020-01-13 NOTE — Patient Instructions (Addendum)
Rash looks much better today  If it re occurs we will want you to see dermatology again   Stay cool  Avoid fragrances and harsh detergents  Do not use fabric softener    I sent lexapro to pharmacy- start back and see how it helps sleep  You will need to touch base with dermatology if the rash re occurs

## 2020-01-13 NOTE — Progress Notes (Signed)
Subjective:    Patient ID: Deborah Gardner, female    DOB: [birth year], 74 y.o.   MRN: [id number]  This visit occurred during the SARS-CoV-2 public health emergency.  Safety protocols were in place, including screening questions prior to the visit, additional usage of staff PPE, and extensive cleaning of exam room while observing appropriate contact time as indicated for disinfecting solutions.     HPI Pt presents for c/o itching /? Contact dermatitis and also insomnia   She saw gyn Rita Ohara) for this on 7/1 (reviewed this note in epic) Rash started 2-3 wk ago  R side of neck and then spread , then knee and ankle  Used cortisone 10 and hemorrhoid cream and benadryl Some areas got better then some worse on L side (neck and arm)-very itchy  No h/o exposure and   She was diagnosed with likely allergic contact derm of ? Trigger  She was px medrol dosepak and also hydroxyzine 25 mg tab  Thought it resembled bug bikes again   Per pt at one time it looked like bites She checked everywhere - bed/sheets/house and did not see anything Outdoor time-does some work   The hydroxyzine helped itching and sleep   She has also seen dermatology (Dr Pearline Cables) - put on medrol dosepak in November   Right now itches a little bit (itchy/burning- esp on back and neck)  A little evidence left on arms   This does not feel like scabies (and not exp) - she had it years ago and remembers   Insomnia -  Worse with age  Wanted "something to help her sleep" Does not think her stress is high  She wakes up to urinate and cannot go back to sleep   She took lexapro in the past and it worked well - 10 mg and she slept better then This helped anxiety as well  Patient Active Problem List   Diagnosis Date Noted  . Itching 01/13/2020  . UTI (urinary tract infection) 08/11/2019  . Nasal congestion 01/21/2019  . Estrogen deficiency 05/07/2018  . Pelvic prolapse 05/07/2018  . Overactive bladder 05/07/2018  .  Memory change 11/03/2016  . Osteoarthritis of right knee 05/22/2016  . Routine general medical examination at a health care facility 12/02/2014  . Encounter for Medicare annual wellness exam 11/21/2013  . Insomnia 11/21/2013  . Subclinical hyperthyroidism 06/18/2012  . Pruritus ani 03/08/2011  . UNSPECIFIED ESOPHAGITIS 08/12/2010  . OSTEOARTHRITIS, GENERALIZED, HAND 03/21/2010  . 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 ROTATOR CUFF SYNDROME, LEFT 03/21/2010  . ALLERGIC RHINITIS 11/03/2008  . ARTHRALGIA 10/08/2008  . ADENOMATOUS COLONIC POLYP 08/19/2007  . ESOPHAGITIS, REFLUX 08/19/2007  . HIATAL HERNIA 08/19/2007  . Hyperlipidemia 03/05/2007  . G E R D 03/05/2007  . Osteopenia 03/05/2007  . Essential hypertension 02/08/2007  . IBS 02/08/2007  . FIBROCYSTIC BREAST DISEASE 02/08/2007  . OSTEOARTHRITIS 02/08/2007  . [place] DISEASE, LUMBAR 02/08/2007  . STRESS INCONTINENCE 02/08/2007   Past Medical History:  Diagnosis Date  . Allergic rhinitis   . Allergy   . Arthritis   . Cataract    bil  . Colon polyps   . Esophagitis   . GERD (gastroesophageal reflux disease)   . Glaucoma   . HH (hiatus hernia)   . HTN (hypertension)   . Hyperlipidemia   . Increased pressure in the eye   . OA (osteoarthritis)    hands  . Osteoporosis   . Rectal pain    Past Surgical History:  Procedure Laterality Date  . ABDOMINAL HYSTERECTOMY     still has ovaries  . APPENDECTOMY    . BREAST CYST ASPIRATION  1/03  . CATARACT EXTRACTION  9/07  . ESOPHAGOGASTRODUODENOSCOPY  11/08  . HEMORRHOID SURGERY    . rectal sphincterotomy    . Retinal laser surgery    . UPPER GASTROINTESTINAL ENDOSCOPY     Social History   Tobacco Use  . Smoking status: Never Smoker  . Smokeless tobacco: Never Used  Vaping Use  . Vaping Use: Never used  Substance Use Topics  . Alcohol use: No    Alcohol/week: 0.0 standard drinks  . Drug use: No   Family History  Problem Relation Age of Onset  . Colon cancer  Father   . Pneumonia Father   . Thyroid disease Mother   . Depression Mother   . Hypertension Mother   . Osteoporosis Mother   . Liver disease Brother        from alcohol  . Breast cancer Paternal Aunt   . Alcohol abuse Brother        terminal   . Alcohol abuse Brother   . Thyroid disease Other        half sister  . Colon cancer Other        Aunt  . Breast cancer Maternal Aunt   . Breast cancer Paternal Aunt   . Colon polyps Neg Hx   . Esophageal cancer Neg Hx   . Stomach cancer Neg Hx   . Rectal cancer Neg Hx    Allergies  Allergen Reactions  . Atorvastatin Other (See Comments)    Memory loss  . Sulfa Antibiotics     Lip swelling   Current Outpatient Medications on File Prior to Visit  Medication Sig Dispense Refill  . amLODipine-benazepril (LOTREL) 5-10 MG capsule TAKE 1 CAPSULE BY MOUTH EVERY DAY 90 capsule 3  . aspirin 81 MG tablet Take 81 mg by mouth daily.    . Calcium Carb-Cholecalciferol (CALCIUM 600 + D PO) Take 1 tablet by mouth daily.    . fluticasone (FLONASE) 50 MCG/ACT nasal spray PLACE 2 SPRAYS INTO THE NOSE DAILY. (Patient not taking: Reported on 01/08/2020) 48 mL 2  . hydrOXYzine (ATARAX/VISTARIL) 25 MG tablet Take 1 tablet (25 mg total) by mouth 3 (three) times daily as needed for itching. (Patient not taking: Reported on 01/13/2020) 20 tablet 0  . methylPREDNISolone (MEDROL DOSEPAK) 4 MG TBPK tablet Take as directed (Patient not taking: Reported on 01/13/2020) 21 each 0  . omeprazole (PRILOSEC) 20 MG capsule TAKE ONE CAPSULE BY MOUTH once a DAY BEFORE A MEAL 90 capsule 3  . rosuvastatin (CRESTOR) 10 MG tablet Take 1 tablet (10 mg total) by mouth daily. 90 tablet 3  . timolol (TIMOPTIC) 0.5 % ophthalmic solution Place 1 drop into both eyes 2 (two) times daily.      No current facility-administered medications on file prior to visit.    Review of Systems  Constitutional: Negative for activity change, appetite change, fatigue, fever and unexpected weight change.   HENT: Negative for congestion, ear pain, rhinorrhea, sinus pressure and sore throat.   Eyes: Negative for pain, redness and visual disturbance.  Respiratory: Negative for cough, shortness of breath and wheezing.   Cardiovascular: Negative for chest pain and palpitations.  Gastrointestinal: Negative for abdominal pain, blood in stool, constipation and diarrhea.  Endocrine: Negative for polydipsia and polyuria.  Genitourinary: Negative for dysuria, frequency and urgency.  Musculoskeletal: Negative for arthralgias,  back pain and myalgias.  Skin: Negative for pallor and rash.       Itching  Prev rash is mostly resolved  Allergic/Immunologic: Negative for environmental allergies.  Neurological: Negative for dizziness, syncope and headaches.  Hematological: Negative for adenopathy. Does not bruise/bleed easily.  Psychiatric/Behavioral: Positive for sleep disturbance. Negative for decreased concentration, dysphoric mood and suicidal ideas. The patient is nervous/anxious.        Objective:   Physical Exam Constitutional:      General: She is not in acute distress.    Appearance: Normal appearance. She is normal weight. She is not ill-appearing or diaphoretic.  HENT:     Head: Normocephalic and atraumatic.     Mouth/Throat:     Mouth: Mucous membranes are moist.     Pharynx: Oropharynx is clear.     Comments: No mouth or throat swelling Eyes:     General: No scleral icterus.       Right eye: No discharge.        Left eye: No discharge.     Conjunctiva/sclera: Conjunctivae normal.     Pupils: Pupils are equal, round, and reactive to light.  Cardiovascular:     Rate and Rhythm: Normal rate and regular rhythm.  Pulmonary:     Effort: Pulmonary effort is normal. No respiratory distress.     Breath sounds: Normal breath sounds. No wheezing.  Musculoskeletal:     Cervical back: Normal range of motion and neck supple. No tenderness.  Lymphadenopathy:     Cervical: No cervical adenopathy.   Skin:    General: Skin is warm and dry.     Findings: No erythema or rash.     Comments: Previous rash is resolved Skin is mildly dry  Some excoriations on L arm   Neurological:     Mental Status: She is alert.     Coordination: Coordination normal.     Deep Tendon Reflexes: Reflexes normal.  Psychiatric:        Mood and Affect: Mood normal.        Cognition and Memory: Cognition and memory normal.     Comments: Pt discusses sleep problems and stressors candidly            Assessment & Plan:   Problem List Items Addressed This Visit      Other   Insomnia - Primary    Suspect anxiety plays a role as lexapro helped in the past and she wishes to re start it at 10 mg Discussed expectations of SSRI medication including time to effectiveness and mechanism of action, also poss of side effects (early and late)- including mental fuzziness, weight or appetite change, nausea and poss of worse dep or anxiety (even suicidal thoughts)  Pt voiced understanding and will stop med and update if this occurs   Disc sleep hygeine (does well) and ways to distract from busy brain at night (meditation for instance or reading)  inst to update if not helpful      Itching    Prev rash is resolved with prednisone from her gyn  Has seen dermatology in past  ? Dermatitis vs insect bites Still some mild itching and hydroxyzine helps   Disc with pt-if this returns it would be wise to return to dermatology due to ? Diagnosis

## 2020-01-13 NOTE — Assessment & Plan Note (Signed)
Suspect anxiety plays a role as lexapro helped in the past and she wishes to re start it at 10 mg Discussed expectations of SSRI medication including time to effectiveness and mechanism of action, also poss of side effects (early and late)- including mental fuzziness, weight or appetite change, nausea and poss of worse dep or anxiety (even suicidal thoughts)  Pt voiced understanding and will stop med and update if this occurs   Disc sleep hygeine (does well) and ways to distract from busy brain at night (meditation for instance or reading)  inst to update if not helpful

## 2020-01-13 NOTE — Assessment & Plan Note (Signed)
Prev rash is resolved with prednisone from her gyn  Has seen dermatology in past  ? Dermatitis vs insect bites Still some mild itching and hydroxyzine helps   Disc with pt-if this returns it would be wise to return to dermatology due to ? Diagnosis

## 2020-01-19 DIAGNOSIS — L509 Urticaria, unspecified: Secondary | ICD-10-CM | POA: Diagnosis not present

## 2020-01-19 DIAGNOSIS — L503 Dermatographic urticaria: Secondary | ICD-10-CM | POA: Diagnosis not present

## 2020-02-06 DIAGNOSIS — I10 Essential (primary) hypertension: Secondary | ICD-10-CM | POA: Diagnosis not present

## 2020-02-06 DIAGNOSIS — H35371 Puckering of macula, right eye: Secondary | ICD-10-CM | POA: Diagnosis not present

## 2020-02-06 DIAGNOSIS — H40053 Ocular hypertension, bilateral: Secondary | ICD-10-CM | POA: Diagnosis not present

## 2020-02-09 DIAGNOSIS — L501 Idiopathic urticaria: Secondary | ICD-10-CM | POA: Diagnosis not present

## 2020-02-09 DIAGNOSIS — J309 Allergic rhinitis, unspecified: Secondary | ICD-10-CM | POA: Diagnosis not present

## 2020-02-09 DIAGNOSIS — J3089 Other allergic rhinitis: Secondary | ICD-10-CM | POA: Diagnosis not present

## 2020-02-16 ENCOUNTER — Telehealth: Payer: Self-pay

## 2020-02-16 DIAGNOSIS — L299 Pruritus, unspecified: Secondary | ICD-10-CM

## 2020-02-16 DIAGNOSIS — E0590 Thyrotoxicosis, unspecified without thyrotoxic crisis or storm: Secondary | ICD-10-CM

## 2020-02-16 DIAGNOSIS — I10 Essential (primary) hypertension: Secondary | ICD-10-CM

## 2020-02-16 NOTE — Telephone Encounter (Signed)
Left VM requesting pt to call the office back 

## 2020-02-16 NOTE — Telephone Encounter (Signed)
I reviewed her allergist note and there was nothing specific re: labs to order /so not sure  It sounds like we need to get her back to dermatology- does she need a referral ?

## 2020-02-16 NOTE — Telephone Encounter (Signed)
Pt left v/m that she has seen Dr Glori Bickers, a dermatologist and an allergist. Pt recently had cortisone shot and just finished oral prednisone on 02/12/20. Pt said her l,t arm is starting to itch again and the lt ankle never cleared even after getting the cortisone shot and taking the prednisone orally. Pt said allergist recommended pt to FU with dermatologist and possible lab testing. Pt wants to know if Dr Glori Bickers can order lab testing. Pt request cb after Dr Glori Bickers reviews.

## 2020-02-17 ENCOUNTER — Other Ambulatory Visit: Payer: Self-pay

## 2020-02-17 ENCOUNTER — Other Ambulatory Visit (INDEPENDENT_AMBULATORY_CARE_PROVIDER_SITE_OTHER): Payer: Medicare PPO

## 2020-02-17 DIAGNOSIS — E0590 Thyrotoxicosis, unspecified without thyrotoxic crisis or storm: Secondary | ICD-10-CM | POA: Diagnosis not present

## 2020-02-17 DIAGNOSIS — I10 Essential (primary) hypertension: Secondary | ICD-10-CM | POA: Diagnosis not present

## 2020-02-17 LAB — COMPREHENSIVE METABOLIC PANEL
ALT: 11 U/L (ref 0–35)
AST: 10 U/L (ref 0–37)
Albumin: 4.3 g/dL (ref 3.5–5.2)
Alkaline Phosphatase: 68 U/L (ref 39–117)
BUN: 15 mg/dL (ref 6–23)
CO2: 30 mEq/L (ref 19–32)
Calcium: 9.7 mg/dL (ref 8.4–10.5)
Chloride: 104 mEq/L (ref 96–112)
Creatinine, Ser: 0.78 mg/dL (ref 0.40–1.20)
GFR: 71.69 mL/min (ref >60.00)
Glucose, Bld: 88 mg/dL (ref 70–99)
Potassium: 4.3 mEq/L (ref 3.5–5.1)
Sodium: 137 mEq/L (ref 135–145)
Total Bilirubin: 0.5 mg/dL (ref 0.2–1.2)
Total Protein: 7.1 g/dL (ref 6.0–8.3)

## 2020-02-17 LAB — LIPID PANEL
Cholesterol: 140 mg/dL (ref 0–200)
HDL: 76.3 mg/dL (ref >39.00)
LDL Cholesterol: 52 mg/dL (ref 0–99)
NonHDL: 63.54
Total CHOL/HDL Ratio: 2
Triglycerides: 59 mg/dL (ref 0.0–149.0)
VLDL: 11.8 mg/dL (ref 0.0–40.0)

## 2020-02-17 LAB — TSH: TSH: 0.37 u[IU]/mL (ref 0.35–4.50)

## 2020-02-17 LAB — CBC WITH DIFFERENTIAL/PLATELET
Basophils Absolute: 0.1 10*3/uL (ref 0.0–0.1)
Basophils Relative: 0.5 % (ref 0.0–3.0)
Eosinophils Absolute: 0.2 10*3/uL (ref 0.0–0.7)
Eosinophils Relative: 2 % (ref 0.0–5.0)
HCT: 37.5 % (ref 36.0–46.0)
Hemoglobin: 12.3 g/dL (ref 12.0–15.0)
Lymphocytes Relative: 23.7 % (ref 12.0–46.0)
Lymphs Abs: 2.7 10*3/uL (ref 0.7–4.0)
MCHC: 32.8 g/dL (ref 30.0–36.0)
MCV: 89.1 fl (ref 78.0–100.0)
Monocytes Absolute: 1.1 10*3/uL — ABNORMAL HIGH (ref 0.1–1.0)
Monocytes Relative: 9.8 % (ref 3.0–12.0)
Neutro Abs: 7.2 10*3/uL (ref 1.4–7.7)
Neutrophils Relative %: 64 % (ref 43.0–77.0)
Platelets: 242 10*3/uL (ref 150.0–400.0)
RBC: 4.21 Mil/uL (ref 3.87–5.11)
RDW: 13.4 % (ref 11.5–15.5)
WBC: 11.2 10*3/uL — ABNORMAL HIGH (ref 4.0–10.5)

## 2020-02-17 LAB — T4, FREE: Free T4: 0.98 ng/dL (ref 0.60–1.60)

## 2020-02-17 NOTE — Telephone Encounter (Signed)
Pt said after her allergist appt she did f/u with dermatologist, they are the one's who suggest she gets some basic labs from her PCP before she f/u up with them to rule out anything. Daughter works at [employer] and agrees she should have labs and her thyroid checked. Pt said the allergist did a full work up and everything that she is allergic to she wouldn't be around so that's when she f/u with the dermatologist who gave her a cortisone shot and put her on prednisone and also zyrtec and xyzal. Pt said she finished the prednisone on 02/12/20 and by 02/15/20 her hives/rash was back it's on her left arm and feet/ankles the worse and she called the dermatologist to let them know and that's when she was advised to have PCP do some standard labs to rule anything out and then if labs were all normal to f/u with derm after labs. Pt is wanting to get labs done asap (possibly today) so she can f/u with derm afterwards. Pt said she is just fustrated with these hives/rash that's not really clearing up

## 2020-02-17 NOTE — Telephone Encounter (Signed)
Pt will come in now for labs. She is fasting. I advised her of Dr. Marliss Coots comments

## 2020-02-17 NOTE — Telephone Encounter (Signed)
Done If labs are normal- I may try holding her benazepril for a little while to see if she has developed an allergy to it  Let's see how labs look first

## 2020-02-17 NOTE — Telephone Encounter (Signed)
[place] Night - Client Nonclinical Telephone Record AccessNurse Client [place] Night - Client Client Site [place] [facility], Roque Lias - MD Contact Type Call Who Is Calling Patient / Member / Family / Caregiver Caller Name Star Phone Number [phone] Call Type Message Only Information Provided Reason for Call Returning a Call from the Office Initial [place] states she is returning a call from the MD. Additional Comment Provided office hours and advised to call back. Non symptomatic. Disp. Time Disposition Final User 02/16/2020 5:18:53 PM General Information Provided Yes Ovid Curd, [place] Call Closed By: Rolan Bucco Transaction Date/Time: 02/16/2020 5:15:57 PM (ET)

## 2020-03-10 ENCOUNTER — Other Ambulatory Visit: Payer: Self-pay | Admitting: Family Medicine

## 2020-03-10 DIAGNOSIS — Z1231 Encounter for screening mammogram for malignant neoplasm of breast: Secondary | ICD-10-CM

## 2020-03-16 ENCOUNTER — Telehealth: Payer: Self-pay

## 2020-03-16 DIAGNOSIS — M9904 Segmental and somatic dysfunction of sacral region: Secondary | ICD-10-CM | POA: Diagnosis not present

## 2020-03-16 DIAGNOSIS — M9903 Segmental and somatic dysfunction of lumbar region: Secondary | ICD-10-CM | POA: Diagnosis not present

## 2020-03-16 DIAGNOSIS — M5124 Other intervertebral disc displacement, thoracic region: Secondary | ICD-10-CM | POA: Diagnosis not present

## 2020-03-16 DIAGNOSIS — M9905 Segmental and somatic dysfunction of pelvic region: Secondary | ICD-10-CM | POA: Diagnosis not present

## 2020-03-16 NOTE — Telephone Encounter (Signed)
Error

## 2020-03-18 ENCOUNTER — Ambulatory Visit (INDEPENDENT_AMBULATORY_CARE_PROVIDER_SITE_OTHER): Payer: Medicare PPO | Admitting: Family Medicine

## 2020-03-18 ENCOUNTER — Encounter: Payer: Self-pay | Admitting: Family Medicine

## 2020-03-18 ENCOUNTER — Other Ambulatory Visit: Payer: Self-pay

## 2020-03-18 VITALS — BP 144/80 | HR 56 | Temp 96.9°F | Ht 61.0 in | Wt 148.6 lb

## 2020-03-18 DIAGNOSIS — Z23 Encounter for immunization: Secondary | ICD-10-CM | POA: Diagnosis not present

## 2020-03-18 DIAGNOSIS — I10 Essential (primary) hypertension: Secondary | ICD-10-CM | POA: Diagnosis not present

## 2020-03-18 DIAGNOSIS — J0100 Acute maxillary sinusitis, unspecified: Secondary | ICD-10-CM | POA: Diagnosis not present

## 2020-03-18 MED ORDER — AMLODIPINE BESYLATE 2.5 MG PO TABS: 2.5000 mg | ORAL_TABLET | Freq: Every day | ORAL | 5 refills | Status: DC

## 2020-03-18 MED ORDER — AMOXICILLIN 500 MG PO CAPS: 500.0000 mg | ORAL_CAPSULE | Freq: Three times a day (TID) | ORAL | 0 refills | Status: DC

## 2020-03-18 NOTE — Assessment & Plan Note (Signed)
Pain/tenderness in R frontal/ethmoid sinus  tx with amoxicillin  Update if not starting to improve in a week or if worsening   Nasal saline is ok if congested  Does have seasonal allergies

## 2020-03-18 NOTE — Patient Instructions (Addendum)
For blood pressure -take amlodipine 2.5 mg daily  Watch bp and keep me updated (let me know if BP does not improve)   If hives return-let me know and stop it   Take amoxicillin for a sinus infection   Please let me know

## 2020-03-18 NOTE — Assessment & Plan Note (Signed)
Stopped amlodipine/benazepril due to hives  Strongly suspect it was from benazepril (not amlodipine) bp is labile-also having headaches  Will try 2.5 mg of amlodipine daily and see how blood pressure responds (she will call)  If hives/rash or any other side eff- inst to call and stop it  Hesitant to try classic ca channel blocker or beta blocker due to baseline low pulse rate

## 2020-03-18 NOTE — Progress Notes (Signed)
Subjective:    Patient ID: Deborah Gardner, female    DOB: 02/12/1946, 74 y.o.   MRN: [id number]  This visit occurred during the SARS-CoV-2 public health emergency.  Safety protocols were in place, including screening questions prior to the visit, additional usage of staff PPE, and extensive cleaning of exam room while observing appropriate contact time as indicated for disinfecting solutions.    HPI Pt presents with HTN and headache (sinus area)   5-6 days   Wt Readings from Last 3 Encounters:  03/18/20 148 lb 9 oz (67.4 kg)  01/13/20 149 lb 11.2 oz (67.9 kg)  01/08/20 151 lb 3.2 oz (68.6 kg)   28.07 kg/m  She is covid immunized   At home bp have trended up and down  As high as 322 systolic but sometimes in the 120s   BP Readings from Last 3 Encounters:  03/18/20 (!) 144/80  01/13/20 138/80  01/08/20 138/74   Pulse Readings from Last 3 Encounters:  03/18/20 (!) 56  01/13/20 60  01/08/20 60   Was prev on amlodipine-benazapril -stopped due to hives (that came and went) Hives better on bp med  Having headaches above her eyes - was prev worse on the R No nasal mucous/does not feel congested  A little off balance  No ST or cough or fever  Taking zyrtec for her allergies       Recent labs  Lab Results  Component Value Date   CREATININE 0.78 02/17/2020   BUN 15 02/17/2020   NA 137 02/17/2020   K 4.3 02/17/2020   CL 104 02/17/2020   CO2 30 02/17/2020   Lab Results  Component Value Date   ALT 11 02/17/2020   AST 10 02/17/2020   ALKPHOS 68 02/17/2020   BILITOT 0.5 02/17/2020   Lab Results  Component Value Date   WBC 11.2 (H) 02/17/2020   HGB 12.3 02/17/2020   HCT 37.5 02/17/2020   MCV 89.1 02/17/2020   PLT 242.0 02/17/2020  she was recently on prednisone Glucose 88  Lab Results  Component Value Date   TSH 0.37 02/17/2020    Wants flu shot today   Patient Active Problem List   Diagnosis Date Noted  . Itching 01/13/2020  . UTI (urinary tract  infection) 08/11/2019  . Nasal congestion 01/21/2019  . Acute sinusitis 09/10/2018  . Estrogen deficiency 05/07/2018  . Pelvic prolapse 05/07/2018  . Overactive bladder 05/07/2018  . Memory change 11/03/2016  . Osteoarthritis of right knee 05/22/2016  . Routine general medical examination at a health care facility 12/02/2014  . Encounter for Medicare annual wellness exam 11/21/2013  . Insomnia 11/21/2013  . Subclinical hyperthyroidism 06/18/2012  . Pruritus ani 03/08/2011  . UNSPECIFIED ESOPHAGITIS 08/12/2010  . OSTEOARTHRITIS, GENERALIZED, HAND 03/21/2010  . 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 ROTATOR CUFF SYNDROME, LEFT 03/21/2010  . ALLERGIC RHINITIS 11/03/2008  . ARTHRALGIA 10/08/2008  . ADENOMATOUS COLONIC POLYP 08/19/2007  . ESOPHAGITIS, REFLUX 08/19/2007  . HIATAL HERNIA 08/19/2007  . Hyperlipidemia 03/05/2007  . G E R D 03/05/2007  . Osteopenia 03/05/2007  . Essential hypertension 02/08/2007  . IBS 02/08/2007  . FIBROCYSTIC BREAST DISEASE 02/08/2007  . OSTEOARTHRITIS 02/08/2007  . [place] DISEASE, LUMBAR 02/08/2007  . STRESS INCONTINENCE 02/08/2007   Past Medical History:  Diagnosis Date  . Allergic rhinitis   . Allergy   . Arthritis   . Cataract    bil  . Colon polyps   . Esophagitis   . GERD (gastroesophageal reflux  disease)   . Glaucoma   . HH (hiatus hernia)   . HTN (hypertension)   . Hyperlipidemia   . Increased pressure in the eye   . OA (osteoarthritis)    hands  . Osteoporosis   . Rectal pain    Past Surgical History:  Procedure Laterality Date  . ABDOMINAL HYSTERECTOMY     still has ovaries  . APPENDECTOMY    . BREAST CYST ASPIRATION  1/03  . CATARACT EXTRACTION  9/07  . ESOPHAGOGASTRODUODENOSCOPY  11/08  . HEMORRHOID SURGERY    . rectal sphincterotomy    . Retinal laser surgery    . UPPER GASTROINTESTINAL ENDOSCOPY     Social History   Tobacco Use  . Smoking status: Never Smoker  . Smokeless tobacco: Never Used  Vaping Use  .  Vaping Use: Never used  Substance Use Topics  . Alcohol use: No    Alcohol/week: 0.0 standard drinks  . Drug use: No   Family History  Problem Relation Age of Onset  . Colon cancer Father   . Pneumonia Father   . Thyroid disease Mother   . Depression Mother   . Hypertension Mother   . Osteoporosis Mother   . Liver disease Brother        from alcohol  . Breast cancer Paternal Aunt   . Alcohol abuse Brother        terminal   . Alcohol abuse Brother   . Thyroid disease Other        half sister  . Colon cancer Other        Aunt  . Breast cancer Maternal Aunt   . Breast cancer Paternal Aunt   . Colon polyps Neg Hx   . Esophageal cancer Neg Hx   . Stomach cancer Neg Hx   . Rectal cancer Neg Hx    Allergies  Allergen Reactions  . Atorvastatin Other (See Comments)    Memory loss  . Lotrel [Amlodipine Besy-Benazepril Hcl] Hives    Most likely from the benazepril   . Sulfa Antibiotics     Lip swelling   Current Outpatient Medications on File Prior to Visit  Medication Sig Dispense Refill  . aspirin 81 MG tablet Take 81 mg by mouth daily.    . Calcium Carb-Cholecalciferol (CALCIUM 600 + D PO) Take 1 tablet by mouth daily.    Marland Kitchen escitalopram (LEXAPRO) 10 MG tablet Take 1 tablet (10 mg total) by mouth daily. 90 tablet 3  . fluticasone (FLONASE) 50 MCG/ACT nasal spray PLACE 2 SPRAYS INTO THE NOSE DAILY. 48 mL 2  . omeprazole (PRILOSEC) 20 MG capsule TAKE ONE CAPSULE BY MOUTH once a DAY BEFORE A MEAL 90 capsule 3  . rosuvastatin (CRESTOR) 10 MG tablet Take 1 tablet (10 mg total) by mouth daily. 90 tablet 3  . timolol (TIMOPTIC) 0.5 % ophthalmic solution Place 1 drop into both eyes 2 (two) times daily.      No current facility-administered medications on file prior to visit.    Review of Systems  Constitutional: Negative for activity change, appetite change, fatigue, fever and unexpected weight change.  HENT: Positive for sinus pain. Negative for congestion, ear pain, rhinorrhea,  sinus pressure and sore throat.   Eyes: Negative for pain, redness and visual disturbance.  Respiratory: Negative for cough, shortness of breath and wheezing.   Cardiovascular: Negative for chest pain and palpitations.  Gastrointestinal: Negative for abdominal pain, blood in stool, constipation and diarrhea.  Endocrine: Negative for polydipsia  and polyuria.  Genitourinary: Negative for dysuria, frequency and urgency.  Musculoskeletal: Negative for arthralgias, back pain and myalgias.  Skin: Negative for pallor and rash.  Allergic/Immunologic: Negative for environmental allergies.  Neurological: Positive for dizziness and headaches. Negative for tremors, seizures, syncope, facial asymmetry, speech difficulty, weakness and numbness.       Occ off balance in ams -being careful  Hematological: Negative for adenopathy. Does not bruise/bleed easily.  Psychiatric/Behavioral: Negative for decreased concentration and dysphoric mood. The patient is not nervous/anxious.        Objective:   Physical Exam Constitutional:      General: She is not in acute distress.    Appearance: She is well-developed and normal weight. She is not ill-appearing or diaphoretic.  HENT:     Head: Normocephalic and atraumatic.     Comments: Tender over R frontal and ethmoid sinus (worse on R than L) No temple tenderness    Right Ear: Tympanic membrane and ear canal normal.     Left Ear: Tympanic membrane and ear canal normal.     Nose: Nose normal. No congestion or rhinorrhea.     Mouth/Throat:     Mouth: Mucous membranes are moist.     Pharynx: Oropharynx is clear.  Eyes:     General: No scleral icterus.       Right eye: No discharge.        Left eye: No discharge.     Extraocular Movements: Extraocular movements intact.     Conjunctiva/sclera: Conjunctivae normal.     Pupils: Pupils are equal, round, and reactive to light.  Neck:     Thyroid: No thyromegaly.     Vascular: No carotid bruit or JVD.    Cardiovascular:     Rate and Rhythm: Normal rate and regular rhythm.     Heart sounds: Normal heart sounds. No gallop.   Pulmonary:     Effort: Pulmonary effort is normal. No respiratory distress.     Breath sounds: Normal breath sounds. No wheezing or rales.  Abdominal:     General: Bowel sounds are normal. There is no distension or abdominal bruit.     Palpations: Abdomen is soft. There is no mass.     Tenderness: There is no abdominal tenderness.  Musculoskeletal:     Cervical back: Normal range of motion and neck supple. No tenderness.  Lymphadenopathy:     Cervical: No cervical adenopathy.  Skin:    General: Skin is warm and dry.     Findings: No rash.  Neurological:     Mental Status: She is alert.     Deep Tendon Reflexes: Reflexes are normal and symmetric.           Assessment & Plan:   Problem List Items Addressed This Visit      Cardiovascular and Mediastinum   Essential hypertension - Primary    Stopped amlodipine/benazepril due to hives  Strongly suspect it was from benazepril (not amlodipine) bp is labile-also having headaches  Will try 2.5 mg of amlodipine daily and see how blood pressure responds (she will call)  If hives/rash or any other side eff- inst to call and stop it  Hesitant to try classic ca channel blocker or beta blocker due to baseline low pulse rate      Relevant Medications   amLODipine (NORVASC) 2.5 MG tablet     Respiratory   Acute sinusitis    Pain/tenderness in R frontal/ethmoid sinus  tx with amoxicillin  Update if not  starting to improve in a week or if worsening   Nasal saline is ok if congested  Does have seasonal allergies       Relevant Medications   amoxicillin (AMOXIL) 500 MG capsule

## 2020-03-25 ENCOUNTER — Other Ambulatory Visit: Payer: Self-pay

## 2020-03-25 ENCOUNTER — Ambulatory Visit
Admission: RE | Admit: 2020-03-25 | Discharge: 2020-03-25 | Disposition: A | Discharge status: Home / Self Care | Payer: Medicare PPO | Source: Ambulatory Visit | Attending: Family Medicine | Admitting: Family Medicine

## 2020-03-25 DIAGNOSIS — Z1231 Encounter for screening mammogram for malignant neoplasm of breast: Secondary | ICD-10-CM

## 2020-03-26 ENCOUNTER — Other Ambulatory Visit: Payer: Self-pay | Admitting: Family Medicine

## 2020-03-26 ENCOUNTER — Telehealth: Payer: Self-pay

## 2020-03-26 DIAGNOSIS — J0100 Acute maxillary sinusitis, unspecified: Secondary | ICD-10-CM

## 2020-03-26 MED ORDER — HYDROCHLOROTHIAZIDE 25 MG PO TABS: 25.0000 mg | ORAL_TABLET | Freq: Every day | ORAL | 1 refills | Status: DC

## 2020-03-26 NOTE — Telephone Encounter (Signed)
Called patient reviewed medications. Will make change today. I have made follow up in office. Will call if any change or new symptoms.

## 2020-03-26 NOTE — Telephone Encounter (Signed)
Stay off amlodipine  For blood pressure- start hctz 25 mg  (I sent to pharmacy_ It is a diuretic and will cause more frequent urination after taking it so stick to mornings for dosing   Drink fluids Try nasal saline spray  If congested-mucinex may help sinus pressure (but I don't think she is)  Warm compresses on sinus area may help  Ibuprofen will also help (she is already taking)   F/u with me in about 10-14 days to see how bp is on hctz and check labs   If any new symptoms let us know

## 2020-03-26 NOTE — Telephone Encounter (Signed)
Pt said that she saw Dr Glori Bickers on 03/18/20 and amlodipine besylate 2.5 mg was started; pt went to beach from 03/21/20 -03/24/20; after taking amlodipine besylate 2.5 mg for couple of days pt broke out in rash and pt stopped the medication. Pt still has 2 more days of taking Amoxicillin 500 mg. Pt said she is not dizzy but she feels off balance. Pt said she has soreness over bridge of nose and around eyebrows. Pt having bad h/a behind eyes on and off; now H/A with pain level of 3 - 4. Pt said ibuprofen helps h/a but effect does not last.  No head congestion or runny noise and no CP or SOB; on 03/25/20  At 12:25 pm BP was 160/74 P 56 and this morning 9 AM BP 161/86 P 53. Pt said she feels fine except h/a and pressure from sinus. Pt has been taking zyrtec every night but not relieving sinus pressure and pt wants to know if Dr Glori Bickers could suggest another med to try either OTC or prescription instead of zyrtec. Pt also wants to know if needs different med for BP. Pt request cb. [facility]. Updated allergy med list.

## 2020-03-30 NOTE — Telephone Encounter (Signed)
I would like her to be seen by ENT for sinus problem and dizziness since she is not making much progress.  Is she ok with that?   Does she pref [facility] or [facility] ?

## 2020-03-30 NOTE — Telephone Encounter (Signed)
Pt agreed with referral. She said she has seen Melony Overly, MD in the past in [place] and would like to be referred to him ASAP

## 2020-03-30 NOTE — Telephone Encounter (Signed)
Ref done  Will route to [facility] 

## 2020-03-30 NOTE — Telephone Encounter (Signed)
Pt left v/m; pt taking HCTZ 25 mg as instructed; pt is taking ibuprofen; pt is sore at bridge of nose; pt is not getting any better and request what else can be done for pt to feel better. Room is not spinning but pt wants to sit with eyes closed and pt feels woozy.

## 2020-03-31 ENCOUNTER — Telehealth: Payer: Self-pay | Admitting: *Deleted

## 2020-03-31 DIAGNOSIS — R42 Dizziness and giddiness: Secondary | ICD-10-CM

## 2020-03-31 DIAGNOSIS — J0100 Acute maxillary sinusitis, unspecified: Secondary | ICD-10-CM

## 2020-03-31 DIAGNOSIS — R0981 Nasal congestion: Secondary | ICD-10-CM

## 2020-03-31 MED ORDER — MECLIZINE HCL 25 MG PO TABS: 25.0000 mg | ORAL_TABLET | Freq: Three times a day (TID) | ORAL | 0 refills | Status: DC | PRN

## 2020-03-31 MED ORDER — PREDNISONE 10 MG PO TABS: ORAL_TABLET | ORAL | 0 refills | Status: DC

## 2020-03-31 MED ORDER — LORAZEPAM 0.5 MG PO TABS: 0.5000 mg | ORAL_TABLET | Freq: Two times a day (BID) | ORAL | 1 refills | Status: DC | PRN

## 2020-03-31 NOTE — Telephone Encounter (Signed)
Pt's daughter got on the phone and was upset about the situation. She said if we are not going to let pt take sudafed then we need to get her something to help. Pt's sinuses are swollen and her eyes are puffy under them. Pt can't blow any of the congestion out of her nose it's all in her head and making her really dizzy. Pt's daughter is upset because we can't see her in the office and they refused to take her to UC due to covid. She said pt lives alone and it's dangerous to be alone due to her fall risk from being dizzy. They said pt was so confused earlier that she didn't mean to say Ativan she was trying to say that she needed some antivert sent in and that's the med daughter wants called in. Daughter did tell me that pt is already taking OTC dramamine and that's not helping. Daughter said they will go ahead and see someone else if you can put a referral in urgently somewhere else. Daughter also said until she is seen Dr. Glori Bickers needs to call her something in. I did advise we had sent in an abx earlier this month and daughter said she thinks she needs another round of abx or something stronger because pt looks "bad" they said they are very worried about mother, I did also advise pt may want to start back using nasal spray on a regular basis until she can be seen

## 2020-03-31 NOTE — Telephone Encounter (Signed)
Patient left a voicemail stating that she called last week and was trying to get a referral to Dr. Lucia Gaskins. Patient stated that she has not heard anything back about the referral. Patient stated that she went ahead and called Dr. Pollie Friar office and is not able to see him until 04/20/20. Patient stated that she is having pressure in her ears and sinuses. Patient stated that she had an inner ear infection a long time ago, Patient stated she is still feeling woozy and once before Dr. Glori Bickers gave he some Ativan for problems like this. Patient wants to know if Dr. Glori Bickers will send her a script in for Ativan to help with this. Patient stated that she is concerned that she may fall because she is wobbly at times. Pharmacy [facility] [address]

## 2020-03-31 NOTE — Telephone Encounter (Signed)
Ref done  Routine to [facility]

## 2020-03-31 NOTE — Addendum Note (Signed)
Addended by: Loura Pardon A on: 03/31/2020 06:38 PM   Modules accepted: Orders

## 2020-03-31 NOTE — Telephone Encounter (Signed)
Sudafed causes anxiety and elevated BP and pulse Not a good choice for her

## 2020-03-31 NOTE — Telephone Encounter (Signed)
Pt notified of Dr. Marliss Coots comments and verbalized understanding. Pt said she will try the ativan and see if that helps until she can see ENT, that ENT knows her and she would like to keep appt, she also had them put her on a cancellation list.   **pt has another question** daughter advise her that maybe an OTC decongestant like sudafed may help her sxs. Pt said she hasn't tried any OTC meds but wanted to see if sudafed is okay to take if not what OTC med does Dr. Glori Bickers recommend.

## 2020-03-31 NOTE — Telephone Encounter (Signed)
I sent it  Be careful because ativan is sedating and also can cause falls   If she is open to seeing another ENT we can see if anyone else can see her sooner, just let me know

## 2020-03-31 NOTE — Telephone Encounter (Signed)
Referral has been sent over to Dr. Lucia Gaskins. I attmepted to contact their office to verify that referral had been received. I was unable to reach anyone, and I left a VM for them to return phone call.

## 2020-03-31 NOTE — Telephone Encounter (Signed)
I pended a px for meclizine-watch out for sedation  Please cancel the ativan  Since the abx did not help we can try a round of prednisone to help the inflammation in sinuses/nasal passages and ear tubes  I pended if she is interested   Send this back to me and I will place new ENT ref as well   As always-if symptoms are severe she needs to get attention in UC or ER since we cannot see respiratory patients at this time   Keep me posted

## 2020-03-31 NOTE — Telephone Encounter (Signed)
Pt notified of Dr. Marliss Coots comments and instructions. Pt did want to try the prednisone, so both Rxs sent in. Ativan Rx cancel   Will route back to PCP to do new ENT referral

## 2020-04-01 NOTE — Telephone Encounter (Signed)
Noted. Working on referral

## 2020-04-05 DIAGNOSIS — R519 Headache, unspecified: Secondary | ICD-10-CM | POA: Diagnosis not present

## 2020-04-05 DIAGNOSIS — J0141 Acute recurrent pansinusitis: Secondary | ICD-10-CM | POA: Diagnosis not present

## 2020-04-05 DIAGNOSIS — R42 Dizziness and giddiness: Secondary | ICD-10-CM | POA: Diagnosis not present

## 2020-04-05 DIAGNOSIS — G8929 Other chronic pain: Secondary | ICD-10-CM | POA: Diagnosis not present

## 2020-04-12 ENCOUNTER — Other Ambulatory Visit: Payer: Self-pay

## 2020-04-12 ENCOUNTER — Encounter: Payer: Self-pay | Admitting: Family Medicine

## 2020-04-12 ENCOUNTER — Ambulatory Visit (INDEPENDENT_AMBULATORY_CARE_PROVIDER_SITE_OTHER): Payer: Medicare PPO | Admitting: Family Medicine

## 2020-04-12 VITALS — BP 131/80 | HR 58 | Temp 97.0°F | Ht 61.0 in | Wt 149.3 lb

## 2020-04-12 DIAGNOSIS — I10 Essential (primary) hypertension: Secondary | ICD-10-CM

## 2020-04-12 DIAGNOSIS — J0100 Acute maxillary sinusitis, unspecified: Secondary | ICD-10-CM

## 2020-04-12 LAB — BASIC METABOLIC PANEL
BUN: 16 mg/dL (ref 6–23)
CO2: 31 mEq/L (ref 19–32)
Calcium: 9.8 mg/dL (ref 8.4–10.5)
Chloride: 98 mEq/L (ref 96–112)
Creatinine, Ser: 0.88 mg/dL (ref 0.40–1.20)
GFR: 62.35 mL/min (ref >60.00)
Glucose, Bld: 87 mg/dL (ref 70–99)
Potassium: 3.8 mEq/L (ref 3.5–5.1)
Sodium: 138 mEq/L (ref 135–145)

## 2020-04-12 NOTE — Patient Instructions (Addendum)
Call Dr Victorio Palm office and tell them about your side effects from the levaquin (shoulder, dreams)- he may want to change it   Blood pressure looks better  Checking some labs today  Eat a healthy diet/ stay active when feeling better   Stay on the hctz 25 mg daily   So glad you are starting to feel better

## 2020-04-12 NOTE — Assessment & Plan Note (Addendum)
bp in fair control at this time  BP Readings from Last 1 Encounters:  04/12/20 131/80   No changes needed-doing well on hctz 25 mg  Benazepril-amlodipine stopped due to a rash and she feels better  Most recent labs reviewed  Labs drawn today for bmet Disc lifstyle change with low sodium diet and exercise

## 2020-04-12 NOTE — Progress Notes (Signed)
Subjective:    Patient ID: Deborah Gardner, female    DOB: 1946/03/15, 74 y.o.   MRN: [id number]  This visit occurred during the SARS-CoV-2 public health emergency.  Safety protocols were in place, including screening questions prior to the visit, additional usage of staff PPE, and extensive cleaning of exam room while observing appropriate contact time as indicated for disinfecting solutions.    HPI Pt presents for HTN / change in medication and needs labs  Wt Readings from Last 3 Encounters:  04/12/20 149 lb 5 oz (67.7 kg)  03/18/20 148 lb 9 oz (67.4 kg)  01/13/20 149 lb 11.2 oz (67.9 kg)   28.21 kg/m   HTN bp is stable today  No cp or palpitations or headaches or edema  No side effects to medicines  BP Readings from Last 3 Encounters:  04/12/20 136/82  03/18/20 (!) 144/80  01/13/20 138/80     Pulse Readings from Last 3 Encounters:  04/12/20 (!) 58  03/18/20 (!) 56  01/13/20 60   Overall improved with hctz 25 mg  She has not noticed any side effects, tolerating well  Thinks it is working   At home bp range from 120s to low 150s/70s for the most part Yesterday 129/79  Last visit we had her in for ? Allergic txn to amlodipine/benazepril due to hives  Tried holding the benazepril  She called back and had re occurrence of rash so she stopped amlodipine   We replaced that with hctz 25 mg   She has also seen ENT for ongoing sinus problems (frontal and periorbital headache and light headedness)  Dr Wilburn Cornelia She was px levaquin with the plan to fu at 6 wk for f/u and sinus CT  We treated with prednisone prior to that along with amoxicillin (also meclizine for dizziness)  She became too sedated with meclizine and stopped it    Finished with all the prednisone  She says today her L shoulder is bothering her (? Tendon) -unsure if due to the levaquin  Also having vivid dreams    Patient Active Problem List   Diagnosis Date Noted  . Itching 01/13/2020  . UTI  (urinary tract infection) 08/11/2019  . Dizziness 01/21/2019  . Nasal congestion 01/21/2019  . Acute sinusitis 09/10/2018  . Estrogen deficiency 05/07/2018  . Pelvic prolapse 05/07/2018  . Overactive bladder 05/07/2018  . Memory change 11/03/2016  . Osteoarthritis of right knee 05/22/2016  . Routine general medical examination at a health care facility 12/02/2014  . Encounter for Medicare annual wellness exam 11/21/2013  . Insomnia 11/21/2013  . Subclinical hyperthyroidism 06/18/2012  . Pruritus ani 03/08/2011  . UNSPECIFIED ESOPHAGITIS 08/12/2010  . OSTEOARTHRITIS, GENERALIZED, HAND 03/21/2010  . 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 ROTATOR CUFF SYNDROME, LEFT 03/21/2010  . ALLERGIC RHINITIS 11/03/2008  . ARTHRALGIA 10/08/2008  . ADENOMATOUS COLONIC POLYP 08/19/2007  . ESOPHAGITIS, REFLUX 08/19/2007  . HIATAL HERNIA 08/19/2007  . Hyperlipidemia 03/05/2007  . G E R D 03/05/2007  . Osteopenia 03/05/2007  . Essential hypertension 02/08/2007  . IBS 02/08/2007  . FIBROCYSTIC BREAST DISEASE 02/08/2007  . OSTEOARTHRITIS 02/08/2007  . [place] DISEASE, LUMBAR 02/08/2007  . STRESS INCONTINENCE 02/08/2007   Past Medical History:  Diagnosis Date  . Allergic rhinitis   . Allergy   . Arthritis   . Cataract    bil  . Colon polyps   . Esophagitis   . GERD (gastroesophageal reflux disease)   . Glaucoma   . HH (hiatus hernia)   .  HTN (hypertension)   . Hyperlipidemia   . Increased pressure in the eye   . OA (osteoarthritis)    hands  . Osteoporosis   . Rectal pain    Past Surgical History:  Procedure Laterality Date  . ABDOMINAL HYSTERECTOMY     still has ovaries  . APPENDECTOMY    . BREAST CYST ASPIRATION  1/03  . CATARACT EXTRACTION  9/07  . ESOPHAGOGASTRODUODENOSCOPY  11/08  . HEMORRHOID SURGERY    . rectal sphincterotomy    . Retinal laser surgery    . UPPER GASTROINTESTINAL ENDOSCOPY     Social History   Tobacco Use  . Smoking status: Never Smoker  . Smokeless  tobacco: Never Used  Vaping Use  . Vaping Use: Never used  Substance Use Topics  . Alcohol use: No    Alcohol/week: 0.0 standard drinks  . Drug use: No   Family History  Problem Relation Age of Onset  . Colon cancer Father   . Pneumonia Father   . Thyroid disease Mother   . Depression Mother   . Hypertension Mother   . Osteoporosis Mother   . Liver disease Brother        from alcohol  . Breast cancer Paternal Aunt   . Alcohol abuse Brother        terminal   . Alcohol abuse Brother   . Thyroid disease Other        half sister  . Colon cancer Other        Aunt  . Breast cancer Maternal Aunt   . Breast cancer Paternal Aunt   . Colon polyps Neg Hx   . Esophageal cancer Neg Hx   . Stomach cancer Neg Hx   . Rectal cancer Neg Hx    Allergies  Allergen Reactions  . Atorvastatin Other (See Comments)    Memory loss  . Lotrel [Amlodipine Besy-Benazepril Hcl] Hives    Most likely from the benazepril   . Sulfa Antibiotics     Lip swelling  . Amlodipine Besylate Rash   Current Outpatient Medications on File Prior to Visit  Medication Sig Dispense Refill  . aspirin 81 MG tablet Take 81 mg by mouth daily.    . Calcium Carb-Cholecalciferol (CALCIUM 600 + D PO) Take 1 tablet by mouth daily.    Marland Kitchen escitalopram (LEXAPRO) 10 MG tablet Take 1 tablet (10 mg total) by mouth daily. 90 tablet 3  . fluticasone (FLONASE) 50 MCG/ACT nasal spray PLACE 2 SPRAYS INTO THE NOSE DAILY. (Patient taking differently: 2 sprays. PLACE 2 SPRAYS INTO THE NOSE TWICE.) 48 mL 2  . hydrochlorothiazide (HYDRODIURIL) 25 MG tablet Take 1 tablet (25 mg total) by mouth daily. 30 tablet 1  . omeprazole (PRILOSEC) 20 MG capsule TAKE ONE CAPSULE BY MOUTH ONCE A DAY BEFORE A MEAL 90 capsule 0  . rosuvastatin (CRESTOR) 10 MG tablet Take 1 tablet (10 mg total) by mouth daily. 90 tablet 3  . timolol (TIMOPTIC) 0.5 % ophthalmic solution Place 1 drop into both eyes 2 (two) times daily.      No current facility-administered  medications on file prior to visit.    Review of Systems  Constitutional: Negative for activity change, appetite change, fatigue, fever and unexpected weight change.  HENT: Negative for congestion, ear pain, rhinorrhea, sinus pressure and sore throat.   Eyes: Negative for pain, redness and visual disturbance.  Respiratory: Negative for cough, shortness of breath and wheezing.   Cardiovascular: Negative for chest pain and  palpitations.  Gastrointestinal: Negative for abdominal pain, blood in stool, constipation and diarrhea.  Endocrine: Negative for polydipsia and polyuria.  Genitourinary: Negative for dysuria, frequency and urgency.  Musculoskeletal: Positive for arthralgias. Negative for back pain and myalgias.       Left shoulder pain   Skin: Negative for pallor and rash.  Allergic/Immunologic: Negative for environmental allergies.  Neurological: Positive for dizziness. Negative for syncope and headaches.  Hematological: Negative for adenopathy. Does not bruise/bleed easily.  Psychiatric/Behavioral: Negative for decreased concentration and dysphoric mood. The patient is not nervous/anxious.        Objective:   Physical Exam Constitutional:      General: She is not in acute distress.    Appearance: Normal appearance. She is well-developed and normal weight. She is not ill-appearing or diaphoretic.  HENT:     Head: Normocephalic and atraumatic.     Nose: No congestion.  Eyes:     General: No scleral icterus.       Right eye: No discharge.        Left eye: No discharge.     Conjunctiva/sclera: Conjunctivae normal.     Pupils: Pupils are equal, round, and reactive to light.  Neck:     Thyroid: No thyromegaly.     Vascular: No carotid bruit or JVD.  Cardiovascular:     Rate and Rhythm: Normal rate and regular rhythm.     Pulses: Normal pulses.     Heart sounds: No murmur heard.  No gallop.   Pulmonary:     Effort: Pulmonary effort is normal. No respiratory distress.      Breath sounds: Normal breath sounds. No wheezing or rales.  Abdominal:     General: Bowel sounds are normal. There is no distension or abdominal bruit.     Palpations: Abdomen is soft. There is no mass.     Tenderness: There is no abdominal tenderness.  Musculoskeletal:        General: No swelling.     Cervical back: Normal range of motion and neck supple. No tenderness.     Right lower leg: No edema.     Left lower leg: No edema.  Lymphadenopathy:     Cervical: No cervical adenopathy.  Skin:    General: Skin is warm and dry.     Coloration: Skin is not pale.     Findings: No erythema or rash.  Neurological:     Mental Status: She is alert.     Sensory: No sensory deficit.     Coordination: Coordination normal.     Deep Tendon Reflexes: Reflexes are normal and symmetric. Reflexes normal.  Psychiatric:        Mood and Affect: Mood normal.           Assessment & Plan:   Problem List Items Addressed This Visit      Cardiovascular and Mediastinum   Essential hypertension - Primary    bp in fair control at this time  BP Readings from Last 1 Encounters:  04/12/20 131/80   No changes needed-doing well on hctz 25 mg  Benazepril-amlodipine stopped due to a rash and she feels better  Most recent labs reviewed  Labs drawn today for bmet Disc lifstyle change with low sodium diet and exercise        Relevant Orders   Basic metabolic panel (Completed)     Respiratory   Acute sinusitis    Doing better under care of ENT- taking levaquin and finished prednisone Still  having some dizziness  CT of sinuses and ENT f/u is planned  Some shoulder pain-this could be levaquin side eff We discussed it and she plans to call the office and let them know she has a potential side eff

## 2020-04-12 NOTE — Assessment & Plan Note (Signed)
Doing better under care of ENT- taking levaquin and finished prednisone Still having some dizziness  CT of sinuses and ENT f/u is planned  Some shoulder pain-this could be levaquin side eff We discussed it and she plans to call the office and let them know she has a potential side eff

## 2020-04-14 ENCOUNTER — Other Ambulatory Visit: Payer: Self-pay | Admitting: Family Medicine

## 2020-04-17 ENCOUNTER — Other Ambulatory Visit: Payer: Self-pay | Admitting: Family Medicine

## 2020-04-20 ENCOUNTER — Ambulatory Visit (INDEPENDENT_AMBULATORY_CARE_PROVIDER_SITE_OTHER): Payer: Medicare PPO | Admitting: Otolaryngology

## 2020-04-27 DIAGNOSIS — M5124 Other intervertebral disc displacement, thoracic region: Secondary | ICD-10-CM | POA: Diagnosis not present

## 2020-04-27 DIAGNOSIS — M9905 Segmental and somatic dysfunction of pelvic region: Secondary | ICD-10-CM | POA: Diagnosis not present

## 2020-04-27 DIAGNOSIS — M9904 Segmental and somatic dysfunction of sacral region: Secondary | ICD-10-CM | POA: Diagnosis not present

## 2020-04-27 DIAGNOSIS — M9903 Segmental and somatic dysfunction of lumbar region: Secondary | ICD-10-CM | POA: Diagnosis not present

## 2020-05-19 DIAGNOSIS — J321 Chronic frontal sinusitis: Secondary | ICD-10-CM | POA: Diagnosis not present

## 2020-05-19 DIAGNOSIS — J302 Other seasonal allergic rhinitis: Secondary | ICD-10-CM | POA: Diagnosis not present

## 2020-05-19 DIAGNOSIS — G8929 Other chronic pain: Secondary | ICD-10-CM | POA: Diagnosis not present

## 2020-05-19 DIAGNOSIS — R519 Headache, unspecified: Secondary | ICD-10-CM | POA: Diagnosis not present

## 2020-05-19 DIAGNOSIS — J0141 Acute recurrent pansinusitis: Secondary | ICD-10-CM | POA: Diagnosis not present

## 2020-06-22 ENCOUNTER — Other Ambulatory Visit: Payer: Self-pay | Admitting: Family Medicine

## 2020-06-29 DIAGNOSIS — M9904 Segmental and somatic dysfunction of sacral region: Secondary | ICD-10-CM | POA: Diagnosis not present

## 2020-06-29 DIAGNOSIS — M9903 Segmental and somatic dysfunction of lumbar region: Secondary | ICD-10-CM | POA: Diagnosis not present

## 2020-06-29 DIAGNOSIS — M9905 Segmental and somatic dysfunction of pelvic region: Secondary | ICD-10-CM | POA: Diagnosis not present

## 2020-06-29 DIAGNOSIS — M5136 Other intervertebral disc degeneration, lumbar region: Secondary | ICD-10-CM | POA: Diagnosis not present

## 2020-08-02 ENCOUNTER — Telehealth: Payer: Self-pay | Admitting: *Deleted

## 2020-08-02 NOTE — Telephone Encounter (Signed)
Pt notified and appt scheduled.

## 2020-08-02 NOTE — Telephone Encounter (Signed)
Patient left a voicemail stating that she continues to have itching. Patient stated that she has seen Dr. Glori Bickers, a dermotologist and allergist. Patient stated that she needs something done about it. Patient wants to know if she can come into the office and possibly have a steroid shot or something because it is really bothering her. Patient stated some days it worse than others. Patient requested a call back with what Dr. Glori Bickers would recommend. Pharmacy [facility] [address]

## 2020-08-02 NOTE — Telephone Encounter (Signed)
Please schedule in office visit .

## 2020-08-05 ENCOUNTER — Other Ambulatory Visit: Payer: Self-pay

## 2020-08-05 ENCOUNTER — Encounter: Payer: Self-pay | Admitting: Family Medicine

## 2020-08-05 ENCOUNTER — Ambulatory Visit (INDEPENDENT_AMBULATORY_CARE_PROVIDER_SITE_OTHER): Payer: Medicare PPO | Admitting: Family Medicine

## 2020-08-05 ENCOUNTER — Telehealth: Payer: Self-pay | Admitting: *Deleted

## 2020-08-05 VITALS — BP 128/84 | HR 61 | Temp 96.9°F | Ht 61.0 in | Wt 151.3 lb

## 2020-08-05 DIAGNOSIS — L509 Urticaria, unspecified: Secondary | ICD-10-CM

## 2020-08-05 NOTE — Telephone Encounter (Signed)
Pt left a message at Triage. Pt did call allergist Dr. Donneta Romberg about food allergies. They advised her that last time they did test pt for food allergies (pt wasn't aware), pt was advised she is allergic to milk, eggs, and shellfish all none anaphylactic reactions she said they are going to send a copy to PCP. Pt said she told them what's going on and Dr. Glori Bickers wanted her to f/u with them. They didn't schedule an appt yet they said they would talk to Dr. Donneta Romberg and see what he wants to do 1st. But Dr. Donneta Romberg isn't in the office until next week so pt said she guesses she will have to "suffer until them" unless Dr. Glori Bickers can suggest anything or send something in until she can f/u with Dr. Rush Landmark office next week

## 2020-08-05 NOTE — Progress Notes (Signed)
Subjective:    Patient ID: Deborah Gardner, female    DOB: 02-05-1946, 75 y.o.   MRN: [id number]  This visit occurred during the SARS-CoV-2 public health emergency.  Safety protocols were in place, including screening questions prior to the visit, additional usage of staff PPE, and extensive cleaning of exam room while observing appropriate contact time as indicated for disinfecting solutions.    HPI Pt presents with itching   Ongoing  Started in the summer/june  Presented with rash on R neck and then rest of body  Thought allergic/contact derm and her gyn tx with prednisone and hydroxyzine Looked like bug bites at the time  Then saw dermatology Dr Pearline Cables and put on medrol dosepak in November    Allergist-was allergic to cats and cockroaches and dust mites  tx with steroid  Not tested for foods  Allergy note from Dr Donneta Romberg reviewed today (august 2021)   Itching waxes and wanes  Always has a rash - shows up more if the scratches (hives)   Has not been able to pair her hives with any trigger   Not anxious/ mood is very good   Temperature   Has stopped all of her products (creams/lotions)  Uses free detergents   Has not had food testing at all   Not aware of tick bites  Eats beef maybe twice per month (occ weekly)   This flare started yesterday evening    meds tried Prednisone -works claritin  Benadryl  Hydroxyzine pepcid  kenolog cream   Avoiding hot water    Med allergies Amlodipine  Ace    Lab Results  Component Value Date   ALT 11 02/17/2020   AST 10 02/17/2020   ALKPHOS 68 02/17/2020   BILITOT 0.5 02/17/2020   Lab Results  Component Value Date   WBC 11.2 (H) 02/17/2020   HGB 12.3 02/17/2020   HCT 37.5 02/17/2020   MCV 89.1 02/17/2020   PLT 242.0 02/17/2020   Lab Results  Component Value Date   TSH 0.37 02/17/2020    Patient Active Problem List   Diagnosis Date Noted  . Urticaria 01/13/2020  . UTI (urinary tract infection) 08/11/2019   . Dizziness 01/21/2019  . Nasal congestion 01/21/2019  . Acute sinusitis 09/10/2018  . Estrogen deficiency 05/07/2018  . Pelvic prolapse 05/07/2018  . Overactive bladder 05/07/2018  . Memory change 11/03/2016  . Osteoarthritis of right knee 05/22/2016  . Routine general medical examination at a health care facility 12/02/2014  . Encounter for Medicare annual wellness exam 11/21/2013  . Insomnia 11/21/2013  . Subclinical hyperthyroidism 06/18/2012  . Pruritus ani 03/08/2011  . UNSPECIFIED ESOPHAGITIS 08/12/2010  . OSTEOARTHRITIS, GENERALIZED, HAND 03/21/2010  . 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 ROTATOR CUFF SYNDROME, LEFT 03/21/2010  . ALLERGIC RHINITIS 11/03/2008  . ARTHRALGIA 10/08/2008  . ADENOMATOUS COLONIC POLYP 08/19/2007  . ESOPHAGITIS, REFLUX 08/19/2007  . HIATAL HERNIA 08/19/2007  . Hyperlipidemia 03/05/2007  . G E R D 03/05/2007  . Osteopenia 03/05/2007  . Essential hypertension 02/08/2007  . IBS 02/08/2007  . FIBROCYSTIC BREAST DISEASE 02/08/2007  . OSTEOARTHRITIS 02/08/2007  . [place] DISEASE, LUMBAR 02/08/2007  . STRESS INCONTINENCE 02/08/2007   Past Medical History:  Diagnosis Date  . Allergic rhinitis   . Allergy   . Arthritis   . Cataract    bil  . Colon polyps   . Esophagitis   . GERD (gastroesophageal reflux disease)   . Glaucoma   . HH (hiatus hernia)   . HTN (  hypertension)   . Hyperlipidemia   . Increased pressure in the eye   . OA (osteoarthritis)    hands  . Osteoporosis   . Rectal pain    Past Surgical History:  Procedure Laterality Date  . ABDOMINAL HYSTERECTOMY     still has ovaries  . APPENDECTOMY    . BREAST CYST ASPIRATION  1/03  . CATARACT EXTRACTION  9/07  . ESOPHAGOGASTRODUODENOSCOPY  11/08  . HEMORRHOID SURGERY    . rectal sphincterotomy    . Retinal laser surgery    . UPPER GASTROINTESTINAL ENDOSCOPY     Social History   Tobacco Use  . Smoking status: Never Smoker  . Smokeless tobacco: Never Used  Vaping Use  .  Vaping Use: Never used  Substance Use Topics  . Alcohol use: No    Alcohol/week: 0.0 standard drinks  . Drug use: No   Family History  Problem Relation Age of Onset  . Colon cancer Father   . Pneumonia Father   . Thyroid disease Mother   . Depression Mother   . Hypertension Mother   . Osteoporosis Mother   . Liver disease Brother        from alcohol  . Breast cancer Paternal Aunt   . Alcohol abuse Brother        terminal   . Alcohol abuse Brother   . Thyroid disease Other        half sister  . Colon cancer Other        Aunt  . Breast cancer Maternal Aunt   . Breast cancer Paternal Aunt   . Colon polyps Neg Hx   . Esophageal cancer Neg Hx   . Stomach cancer Neg Hx   . Rectal cancer Neg Hx    Allergies  Allergen Reactions  . Atorvastatin Other (See Comments)    Memory loss  . Lotrel [Amlodipine Besy-Benazepril Hcl] Hives    Most likely from the benazepril   . Sulfa Antibiotics     Lip swelling  . Amlodipine Besylate Rash   Current Outpatient Medications on File Prior to Visit  Medication Sig Dispense Refill  . Ascorbic Acid (VITAMIN C) 1000 MG tablet Take 1,000 mg by mouth daily.    Marland Kitchen aspirin 81 MG tablet Take 81 mg by mouth daily.    . Calcium Carb-Cholecalciferol (CALCIUM 600 + D PO) Take 1 tablet by mouth daily.    . Cholecalciferol (VITAMIN D3) 25 MCG (1000 UT) CAPS Take 1 capsule by mouth in the morning and at bedtime.    . diphenhydrAMINE (BENADRYL) 25 MG tablet Take 25 mg by mouth at bedtime.    Marland Kitchen escitalopram (LEXAPRO) 10 MG tablet Take 1 tablet (10 mg total) by mouth daily. 90 tablet 3  . fluticasone (FLONASE) 50 MCG/ACT nasal spray PLACE 2 SPRAYS INTO THE NOSE DAILY. (Patient taking differently: 2 sprays. PLACE 2 SPRAYS INTO THE NOSE TWICE.) 48 mL 2  . hydrochlorothiazide (HYDRODIURIL) 25 MG tablet TAKE 1 TABLET BY MOUTH EVERY DAY 90 tablet 1  . loratadine (CLARITIN) 10 MG tablet Take 10 mg by mouth daily.    Marland Kitchen omeprazole (PRILOSEC) 20 MG capsule TAKE ONE  CAPSULE BY MOUTH ONCE A DAY BEFORE A MEAL 90 capsule 1  . rosuvastatin (CRESTOR) 10 MG tablet TAKE 1 TABLET BY MOUTH EVERY DAY 90 tablet 3  . timolol (TIMOPTIC) 0.5 % ophthalmic solution Place 1 drop into both eyes 2 (two) times daily.     Marland Kitchen triamcinolone (KENALOG) 0.1 %  Apply 1 application topically 3 (three) times daily as needed.     No current facility-administered medications on file prior to visit.    Review of Systems  Constitutional: Negative for activity change, appetite change, fatigue, fever and unexpected weight change.  HENT: Negative for congestion, ear pain, rhinorrhea, sinus pressure and sore throat.   Eyes: Negative for pain, redness and visual disturbance.  Respiratory: Negative for cough, shortness of breath and wheezing.   Cardiovascular: Negative for chest pain and palpitations.  Gastrointestinal: Negative for abdominal pain, blood in stool, constipation and diarrhea.  Endocrine: Negative for polydipsia and polyuria.  Genitourinary: Negative for dysuria, frequency and urgency.  Musculoskeletal: Negative for arthralgias, back pain and myalgias.  Skin: Positive for rash. Negative for color change and pallor.  Allergic/Immunologic: Negative for environmental allergies.  Neurological: Negative for dizziness, syncope and headaches.  Hematological: Negative for adenopathy. Does not bruise/bleed easily.  Psychiatric/Behavioral: Negative for decreased concentration and dysphoric mood. The patient is not nervous/anxious.        Objective:   Physical Exam Constitutional:      General: She is not in acute distress.    Appearance: Normal appearance. She is normal weight. She is not ill-appearing or diaphoretic.  HENT:     Head: Normocephalic and atraumatic.     Mouth/Throat:     Mouth: Mucous membranes are moist.  Eyes:     General: No scleral icterus.       Right eye: No discharge.        Left eye: No discharge.     Conjunctiva/sclera: Conjunctivae normal.     Pupils:  Pupils are equal, round, and reactive to light.  Neck:     Vascular: No carotid bruit.  Cardiovascular:     Rate and Rhythm: Normal rate.     Pulses: Normal pulses.     Heart sounds: Normal heart sounds. No murmur heard.   Pulmonary:     Effort: Pulmonary effort is normal.     Breath sounds: Normal breath sounds. No stridor. No wheezing.  Musculoskeletal:     Cervical back: Normal range of motion and neck supple. No tenderness.     Right lower leg: No edema.     Left lower leg: No edema.  Lymphadenopathy:     Cervical: No cervical adenopathy.  Skin:    Coloration: Skin is not pale.     Findings: Rash present.     Comments: Small whelps on posterior neck /chest today  No papules or vesicles  Dermographia noted   Neurological:     Mental Status: She is alert.     Cranial Nerves: No cranial nerve deficit.     Sensory: No sensory deficit.  Psychiatric:        Mood and Affect: Mood normal.           Assessment & Plan:   Problem List Items Addressed This Visit      Musculoskeletal and Integument   Urticaria - Primary    Chronic since summer  Not much improvement with antihistamines/ H2 blocker Much improvement with steroids (temporary) Has not been tested for food allergies Disc remote poss of alpha gal (no tick bites however)  Uses scent free products and stays cool  Recommend allergist f/u to discuss poss food allergies Disc poss drug allergy  Will hold hctz for 1 week and update  Then trial of holding crestor  No angioedema  Will update if symptoms suddenly worsen

## 2020-08-05 NOTE — Telephone Encounter (Signed)
Pt called back, Dr. Rush Landmark office got back in touch with pt. They want her to take Pepcid 40 mg BID, levocentrizine at bedtime, xyzal at bedtime and benadryl PRN at bedtime, also zyrtec in the am.  Pt's question is do you still want her to stick with your plan of stopping HCTZ for one week and if that doesn't help stop crestor for one week, or just stick with the plan Dr. Donneta Romberg said

## 2020-08-05 NOTE — Telephone Encounter (Signed)
Pt notified of Dr. Tower's instructions and verbalized understanding  

## 2020-08-05 NOTE — Patient Instructions (Addendum)
Keep a diet journal and a journal of symptoms  ? If possible food allergy   Consider food allergy testing -please call your allergist to discuss  Get an appointment    Stop hctz for a week - let me know if symptoms improve  If not -start it back   Then stop crestor for a week-the same

## 2020-08-05 NOTE — Assessment & Plan Note (Signed)
Chronic since summer  Not much improvement with antihistamines/ H2 blocker Much improvement with steroids (temporary) Has not been tested for food allergies Disc remote poss of alpha gal (no tick bites however)  Uses scent free products and stays cool  Recommend allergist f/u to discuss poss food allergies Disc poss drug allergy  Will hold hctz for 1 week and update  Then trial of holding crestor  No angioedema  Will update if symptoms suddenly worsen

## 2020-08-05 NOTE — Telephone Encounter (Signed)
Thanks for letting me know!   I do want her to try eliminating the 2 drugs for a week one at a time just to see if it makes a difference Thanks

## 2020-08-10 ENCOUNTER — Telehealth: Payer: Self-pay | Admitting: *Deleted

## 2020-08-10 NOTE — Telephone Encounter (Signed)
That would be a rare occurrence unless traveling outside the country, I think. I would still like her to check back in for a visit with her allergy doctor and see what they think as well.

## 2020-08-10 NOTE — Telephone Encounter (Signed)
Patient left a voicemail stating that her daughter has been doing some research and wants to know if she should be checked for parasites. Patient stated as of yesterday she is doing better with the hives and allergies.  Patient stated that a large area has come up below her wrist Patient stated her daughter is wondering if her bowel problems may be coming from soil because before it got real cold she was out working in the soil in her yard.. Called patient back and was advised the area below her wrist is about the size of a quarter and is the way the hives show up on he skin.

## 2020-08-10 NOTE — Telephone Encounter (Signed)
Called once and line was busy  Called back and no answer and no VM (kept ringing)

## 2020-08-11 NOTE — Telephone Encounter (Signed)
Left VM requesting pt to call the office back 

## 2020-08-12 NOTE — Telephone Encounter (Signed)
Spoke to pt. Advised her of Dr Alba Cory message. She will get with her allergist.

## 2020-08-17 ENCOUNTER — Telehealth: Payer: Self-pay | Admitting: *Deleted

## 2020-08-17 DIAGNOSIS — L509 Urticaria, unspecified: Secondary | ICD-10-CM

## 2020-08-17 NOTE — Telephone Encounter (Signed)
I ordered an alpha gal panel  Let her know that this is not a test for tick bites  It is a test for allergic rxn to meat that a tick bite can cause  We discussed the fact that she gets tick bites in the summer/had some last summer   I was unsure if her allergist already did the test   Please schedule lab visit Thanks

## 2020-08-17 NOTE — Telephone Encounter (Signed)
Left VM requesting pt to call the office back 

## 2020-08-17 NOTE — Telephone Encounter (Signed)
The order number for that panel is [id number]  ZGF

## 2020-08-17 NOTE — Addendum Note (Signed)
Addended by: Loura Pardon A on: 08/17/2020 01:41 PM   Modules accepted: Orders

## 2020-08-17 NOTE — Telephone Encounter (Signed)
We discussed the possibility for alpha gal allergy when she was here in light of the fact she may have had tick bites last summer   Terri or Tam- which test do I order for this here?  (there are several in epic) Thanks !

## 2020-08-17 NOTE — Telephone Encounter (Signed)
Patient left a voicemail stating that she is still having trouble with hives. Patient stated that she is wondering if Dr. Glori Bickers will order some lab work to check for tick bites before she schedules an appointment with the allergist. Patient stated a friend of hers was having problems with hives and her doctor did a ALPHA-GL to check for tick bites. Patient wants to know if she has been checked for tick bites and if not can that be ordered.

## 2020-08-18 ENCOUNTER — Other Ambulatory Visit (INDEPENDENT_AMBULATORY_CARE_PROVIDER_SITE_OTHER): Payer: Medicare PPO

## 2020-08-18 ENCOUNTER — Other Ambulatory Visit: Payer: Self-pay

## 2020-08-18 DIAGNOSIS — T781XXA Other adverse food reactions, not elsewhere classified, initial encounter: Secondary | ICD-10-CM | POA: Diagnosis not present

## 2020-08-18 DIAGNOSIS — L509 Urticaria, unspecified: Secondary | ICD-10-CM

## 2020-08-18 NOTE — Telephone Encounter (Signed)
Pt notified of Dr. Marliss Coots comments and lab appt scheduled. Pt will call allergist to double check to make sure they have not done this test already, if so she will call back and cancel appt

## 2020-08-23 ENCOUNTER — Telehealth: Payer: Self-pay

## 2020-08-23 NOTE — Telephone Encounter (Signed)
Pt left v/m requesting status of lab results for alpha gal panel; see on pts lab tab alpha gal panel was listed on 08/18/20 as active and collected. I spoke with Terri and Irene Shipper in lab and not listed in computer when report will be ready. Sending to Dr Glori Bickers and [place] CMA.

## 2020-08-23 NOTE — Telephone Encounter (Signed)
Sounds like we are still waiting for a result - we will continue to watch for it

## 2020-08-25 LAB — ALPHA-GAL PANEL
Beef IgE: 0.18 kU/L
Class: 0
Class: 0
Galactose-alpha-1,3-galactose IgE: 2.82 kU/L — ABNORMAL HIGH
LAMB/MUTTON IGE: <0.1 kU/L
Pork IgE: <0.1 kU/L

## 2020-09-06 ENCOUNTER — Encounter: Payer: Self-pay | Admitting: Family Medicine

## 2020-09-06 DIAGNOSIS — T781XXA Other adverse food reactions, not elsewhere classified, initial encounter: Secondary | ICD-10-CM | POA: Insufficient documentation

## 2020-10-16 ENCOUNTER — Other Ambulatory Visit: Payer: Self-pay | Admitting: Family Medicine

## 2020-10-21 DIAGNOSIS — M5136 Other intervertebral disc degeneration, lumbar region: Secondary | ICD-10-CM | POA: Diagnosis not present

## 2020-10-21 DIAGNOSIS — M9904 Segmental and somatic dysfunction of sacral region: Secondary | ICD-10-CM | POA: Diagnosis not present

## 2020-10-21 DIAGNOSIS — M9905 Segmental and somatic dysfunction of pelvic region: Secondary | ICD-10-CM | POA: Diagnosis not present

## 2020-10-21 DIAGNOSIS — M9903 Segmental and somatic dysfunction of lumbar region: Secondary | ICD-10-CM | POA: Diagnosis not present

## 2020-10-26 ENCOUNTER — Ambulatory Visit: Payer: Medicare PPO | Admitting: Family Medicine

## 2020-10-26 ENCOUNTER — Other Ambulatory Visit: Payer: Self-pay

## 2020-10-26 ENCOUNTER — Encounter: Payer: Self-pay | Admitting: Family Medicine

## 2020-10-26 VITALS — BP 142/80 | HR 55 | Temp 96.7°F | Wt 153.2 lb

## 2020-10-26 DIAGNOSIS — M25512 Pain in left shoulder: Secondary | ICD-10-CM | POA: Diagnosis not present

## 2020-10-26 DIAGNOSIS — M7582 Other shoulder lesions, left shoulder: Secondary | ICD-10-CM | POA: Diagnosis not present

## 2020-10-26 MED ORDER — TRIAMCINOLONE ACETONIDE 40 MG/ML IJ SUSP
40.0000 mg | Freq: Once | INTRAMUSCULAR | Status: AC
  Administered 2020-10-26: 40 mg via INTRAMUSCULAR

## 2020-10-26 MED ORDER — LIDOCAINE HCL 1 % IJ SOLN
10.0000 mL | Freq: Once | INTRAMUSCULAR | Status: AC
  Administered 2020-10-26: 10 mL via INTRADERMAL

## 2020-10-26 NOTE — Addendum Note (Signed)
Addended by: Elyse Jarvis on: 10/26/2020 04:12 PM   Modules accepted: Orders

## 2020-10-26 NOTE — Progress Notes (Signed)
   Subjective:    Patient ID: Deborah Gardner, female    DOB: 1946-05-01, 75 y.o.   MRN: [id number]  HPI She complains of a 3-week history of left shoulder pain.  No history of overuse or injury.  This interferes with her ADLs and that she cannot comb her hair and has difficulty putting her clothes on.  She complains of pain with any motion of the shoulder.  No grinding weakness numbness or tingling. She would like an injection which did help the last time.  Review of Systems     Objective:   Physical Exam Pain on motion of the shoulder in any direction.  Drop arm test was uncomfortable but strength was noted.  Neer's and Hawkins test was uncomfortable.  Slight grinding was noted.       Assessment & Plan:  Rotator cuff tendinitis, left Left shoulder was prepped in the posterolateral position with Betadine.  40 mg of Kenalog and 3 cc of Xylocaine was injected into the subacromial bursa without difficulty.  She obtained least 50% reduction in pain within the first 5 minutes.  She will return if further trouble.

## 2020-11-15 ENCOUNTER — Telehealth: Payer: Self-pay

## 2020-11-15 NOTE — Telephone Encounter (Signed)
Aware, will see her then 

## 2020-11-15 NOTE — Telephone Encounter (Signed)
Patient called and stated that last week she was sick with a cold, but she is better now except since then she has been having a lot of sinus pressure. Patient denies any other symptoms besides this. Patient stated that she deals with this everytime she gets sick and is requesting an antibiotic. Patient scheduled a VV with Dr. Glori Bickers on Wednesday (5/11) at 10:30.

## 2020-11-17 ENCOUNTER — Encounter: Payer: Self-pay | Admitting: Family Medicine

## 2020-11-17 ENCOUNTER — Telehealth (INDEPENDENT_AMBULATORY_CARE_PROVIDER_SITE_OTHER): Payer: Medicare PPO | Admitting: Family Medicine

## 2020-11-17 ENCOUNTER — Other Ambulatory Visit: Payer: Self-pay

## 2020-11-17 VITALS — BP 148/80 | Temp 97.1°F

## 2020-11-17 DIAGNOSIS — J0100 Acute maxillary sinusitis, unspecified: Secondary | ICD-10-CM

## 2020-11-17 DIAGNOSIS — T781XXA Other adverse food reactions, not elsewhere classified, initial encounter: Secondary | ICD-10-CM | POA: Diagnosis not present

## 2020-11-17 MED ORDER — AMOXICILLIN-POT CLAVULANATE 875-125 MG PO TABS: 1.0000 | ORAL_TABLET | Freq: Two times a day (BID) | ORAL | 0 refills | Status: DC

## 2020-11-17 NOTE — Assessment & Plan Note (Signed)
Much improved with avoidance of beef and pork No further rash Sees allergist Continues to monitor

## 2020-11-17 NOTE — Progress Notes (Signed)
Virtual Visit via Video Note  I connected with Deborah Gardner on 11/17/20 at 10:30 AM EDT by a video enabled telemedicine application and verified that I am speaking with the correct person using two identifiers.  Location: Patient: home Provider: office   I discussed the limitations of evaluation and management by telemedicine and the availability of in person appointments. The patient expressed understanding and agreed to proceed.  Parties involved in encounter  Patient: Deborah Gardner   Provider:  Loura Pardon MD   History of Present Illness: Pt presents for c/o congestion and facial pain  She has a cold about a week ago-that got better   Sinus symptoms  Gets up with a headache Facial and ear pressure and pain forehead/between eyes a little tender Nasal mucous- ? 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 Allergic reaction to alpha-gal 09/06/2020  . Urticaria 01/13/2020  . UTI (urinary tract infection) 08/11/2019  . Dizziness 01/21/2019  . Nasal congestion 01/21/2019  . Acute sinusitis 09/10/2018  . Estrogen deficiency 05/07/2018  . Pelvic prolapse 05/07/2018  . Overactive bladder 05/07/2018  . Memory change 11/03/2016  . Osteoarthritis of right knee 05/22/2016  . Routine general medical examination at a health care facility 12/02/2014  . Encounter for Medicare annual wellness exam 11/21/2013  . Insomnia 11/21/2013  . Subclinical hyperthyroidism 06/18/2012  . Pruritus ani 03/08/2011  . UNSPECIFIED  ESOPHAGITIS 08/12/2010  . OSTEOARTHRITIS, GENERALIZED, HAND 03/21/2010  . 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 ROTATOR CUFF SYNDROME, LEFT 03/21/2010  . ALLERGIC RHINITIS 11/03/2008  . ARTHRALGIA 10/08/2008  . ADENOMATOUS COLONIC POLYP 08/19/2007  . ESOPHAGITIS, REFLUX 08/19/2007  . HIATAL HERNIA 08/19/2007  . Hyperlipidemia 03/05/2007  . G E R D 03/05/2007  . Osteopenia 03/05/2007  . Essential hypertension 02/08/2007  . IBS 02/08/2007  . FIBROCYSTIC BREAST DISEASE 02/08/2007  . OSTEOARTHRITIS 02/08/2007  . 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 STRESS INCONTINENCE 02/08/2007   Past Medical History:  Diagnosis Date  . Allergic rhinitis   . Allergy   . Arthritis   . Cataract    bil  . Colon polyps   . Esophagitis   . GERD (gastroesophageal reflux disease)   . Glaucoma   . HH (hiatus hernia)   . HTN (hypertension)   . Hyperlipidemia   . Increased pressure in the eye   . OA (osteoarthritis)    hands  . Osteoporosis   . Rectal pain    Past Surgical History:  Procedure Laterality Date  . ABDOMINAL HYSTERECTOMY     still has ovaries  . APPENDECTOMY    . BREAST CYST ASPIRATION  1/03  . CATARACT EXTRACTION  9/07  . ESOPHAGOGASTRODUODENOSCOPY  11/08  . HEMORRHOID SURGERY    . rectal sphincterotomy    . Retinal laser surgery    . UPPER GASTROINTESTINAL ENDOSCOPY     Social History  Tobacco Use  . Smoking status: Never Smoker  . Smokeless tobacco: Never Used  Vaping Use  . Vaping Use: Never used  Substance Use Topics  . Alcohol use: No    Alcohol/week: 0.0 standard drinks  . Drug use: No   Family History  Problem Relation Age of Onset  . Colon cancer Father   . Pneumonia Father   . Thyroid disease Mother   . Depression Mother   . Hypertension Mother   . Osteoporosis Mother   . Liver disease Brother        from alcohol  . Breast cancer Paternal Aunt   . Alcohol abuse Brother        terminal   . Alcohol abuse Brother   . Thyroid disease Other         half sister  . Colon cancer Other        Aunt  . Breast cancer Maternal Aunt   . Breast cancer Paternal Aunt   . Colon polyps Neg Hx   . Esophageal cancer Neg Hx   . Stomach cancer Neg Hx   . Rectal cancer Neg Hx    Allergies  Allergen Reactions  . Atorvastatin Other (See Comments)    Memory loss  . Lotrel [Amlodipine Besy-Benazepril Hcl] Hives    Most likely from the benazepril   . Sulfa Antibiotics     Lip swelling  . Amlodipine Besylate Rash   Current Outpatient Medications on File Prior to Visit  Medication Sig Dispense Refill  . Ascorbic Acid (VITAMIN C) 1000 MG tablet Take 1,000 mg by mouth daily.    Marland Kitchen aspirin 81 MG tablet Take 81 mg by mouth daily.    . Calcium Carb-Cholecalciferol (CALCIUM 600 + D PO) Take 1 tablet by mouth daily.    . Cholecalciferol (VITAMIN D3) 25 MCG (1000 UT) CAPS Take 1 capsule by mouth in the morning and at bedtime.    . diphenhydrAMINE (BENADRYL) 25 MG tablet Take 25 mg by mouth at bedtime.    Marland Kitchen escitalopram (LEXAPRO) 10 MG tablet Take 1 tablet (10 mg total) by mouth daily. 90 tablet 3  . fluticasone (FLONASE) 50 MCG/ACT nasal spray PLACE 2 SPRAYS INTO THE NOSE DAILY. (Patient taking differently: 2 sprays. PLACE 2 SPRAYS INTO THE NOSE TWICE.) 48 mL 2  . hydrochlorothiazide (HYDRODIURIL) 25 MG tablet TAKE 1 TABLET BY MOUTH EVERY DAY 90 tablet 1  . loratadine (CLARITIN) 10 MG tablet Take 10 mg by mouth daily.    . Multiple Vitamins-Minerals (ZINC PO) Take 1 tablet by mouth daily.    Marland Kitchen omeprazole (PRILOSEC) 20 MG capsule TAKE ONE CAPSULE BY MOUTH ONCE A DAY BEFORE A MEAL 90 capsule 1  . rosuvastatin (CRESTOR) 10 MG tablet TAKE 1 TABLET BY MOUTH EVERY DAY 90 tablet 3  . timolol (TIMOPTIC) 0.5 % ophthalmic solution Place 1 drop into both eyes 2 (two) times daily.     Marland Kitchen triamcinolone cream (KENALOG) 0.1 % Apply 1 application topically 3 (three) times daily as needed.     No current facility-administered medications on file prior to visit.   Review of  Systems  Constitutional: Negative for chills, fever and malaise/fatigue.  HENT: Positive for congestion and sinus pain. Negative for ear pain and sore throat.   Eyes: Negative for blurred vision, discharge and redness.  Respiratory: Negative for cough, shortness of breath and stridor.   Cardiovascular: Negative for chest pain, palpitations and leg swelling.  Gastrointestinal: Negative for abdominal pain, diarrhea, nausea and  vomiting.  Musculoskeletal: Negative for myalgias.  Skin: Negative for rash.  Neurological: Positive for headaches. Negative for dizziness.    Observations/Objective: Patient appears well, in no distress Weight is baseline  No facial swelling or asymmetry Notes tenderness when palpating area between brows Normal voice-not hoarse and no slurred speech (does sound congested) No obvious tremor or mobility impairment Moving neck and UEs normally Able to hear the call well  No cough or shortness of breath during interview  Talkative and mentally sharp with no cognitive changes No skin changes on face or neck , no rash or pallor Affect is normal    Assessment and Plan:  Problem List Items Addressed This Visit      Respiratory   Acute sinusitis - Primary    With facial pain and congestion more than a week after viral uri  Discussed symptomatic care incl flonase and nasal saline Fluids/rest  Expectorant if needed augmentin px  Update if not starting to improve in a week or if worsening  ER parameters discussed      Relevant Medications   amoxicillin-clavulanate (AUGMENTIN) 875-125 MG tablet     Other   Allergic reaction to alpha-gal    Much improved with avoidance of beef and pork No further rash Sees allergist Continues to monitor         Follow Up Instructions: Take the augmentin as directed  Drink fluids  Continue flonase Add nasal saline spray as needed   Continue to avoid pork and beef  Update if not starting to improve in a week or if  worsening    I discussed the assessment and treatment plan with the patient. The patient was provided an opportunity to ask questions and all were answered. The patient agreed with the plan and demonstrated an understanding of the instructions.   The patient was advised to call back or seek an in-person evaluation if the symptoms worsen or if the condition fails to improve as anticipated.     Loura Pardon, MD

## 2020-11-17 NOTE — Assessment & Plan Note (Signed)
With facial pain and congestion more than a week after viral uri  Discussed symptomatic care incl flonase and nasal saline Fluids/rest  Expectorant if needed augmentin px  Update if not starting to improve in a week or if worsening  ER parameters discussed

## 2020-11-17 NOTE — Patient Instructions (Addendum)
Take the augmentin as directed  Drink fluids  Continue flonase Add nasal saline spray as needed   Continue to avoid pork and beef  Update if not starting to improve in a week or if worsening

## 2020-12-25 ENCOUNTER — Other Ambulatory Visit: Payer: Self-pay | Admitting: Family Medicine

## 2020-12-28 NOTE — Telephone Encounter (Signed)
Please schedule MWV with [place] and CPE with Dr. Glori Bickers with fasting labs prior for sometime after August 10, 22.

## 2020-12-29 NOTE — Telephone Encounter (Signed)
Called pt to schedule appointment. Pt did not answer so I left a voicemail

## 2020-12-31 ENCOUNTER — Other Ambulatory Visit: Payer: Self-pay | Admitting: Family Medicine

## 2021-01-24 ENCOUNTER — Ambulatory Visit (INDEPENDENT_AMBULATORY_CARE_PROVIDER_SITE_OTHER): Payer: Medicare PPO

## 2021-01-24 ENCOUNTER — Other Ambulatory Visit (INDEPENDENT_AMBULATORY_CARE_PROVIDER_SITE_OTHER): Payer: Medicare PPO

## 2021-01-24 ENCOUNTER — Other Ambulatory Visit: Payer: Self-pay

## 2021-01-24 ENCOUNTER — Ambulatory Visit: Payer: Medicare PPO

## 2021-01-24 DIAGNOSIS — Z0000 Encounter for general adult medical examination without abnormal findings: Secondary | ICD-10-CM

## 2021-01-24 LAB — COMPREHENSIVE METABOLIC PANEL
ALT: 8 U/L (ref 0–35)
AST: 13 U/L (ref 0–37)
Albumin: 4.3 g/dL (ref 3.5–5.2)
Alkaline Phosphatase: 55 U/L (ref 39–117)
BUN: 12 mg/dL (ref 6–23)
CO2: 32 mEq/L (ref 19–32)
Calcium: 9.6 mg/dL (ref 8.4–10.5)
Chloride: 103 mEq/L (ref 96–112)
Creatinine, Ser: 0.78 mg/dL (ref 0.40–1.20)
GFR: 73.2 mL/min (ref >60.00)
Glucose, Bld: 92 mg/dL (ref 70–99)
Potassium: 4.1 mEq/L (ref 3.5–5.1)
Sodium: 141 mEq/L (ref 135–145)
Total Bilirubin: 0.5 mg/dL (ref 0.2–1.2)
Total Protein: 6.7 g/dL (ref 6.0–8.3)

## 2021-01-24 LAB — CBC WITH DIFFERENTIAL/PLATELET
Basophils Absolute: 0.1 10*3/uL (ref 0.0–0.1)
Basophils Relative: 0.7 % (ref 0.0–3.0)
Eosinophils Absolute: 0.3 10*3/uL (ref 0.0–0.7)
Eosinophils Relative: 3.8 % (ref 0.0–5.0)
HCT: 35.2 % — ABNORMAL LOW (ref 36.0–46.0)
Hemoglobin: 12 g/dL (ref 12.0–15.0)
Lymphocytes Relative: 25.3 % (ref 12.0–46.0)
Lymphs Abs: 1.9 10*3/uL (ref 0.7–4.0)
MCHC: 34 g/dL (ref 30.0–36.0)
MCV: 89.1 fl (ref 78.0–100.0)
Monocytes Absolute: 0.7 10*3/uL (ref 0.1–1.0)
Monocytes Relative: 9.4 % (ref 3.0–12.0)
Neutro Abs: 4.6 10*3/uL (ref 1.4–7.7)
Neutrophils Relative %: 60.8 % (ref 43.0–77.0)
Platelets: 202 10*3/uL (ref 150.0–400.0)
RBC: 3.95 Mil/uL (ref 3.87–5.11)
RDW: 12.6 % (ref 11.5–15.5)
WBC: 7.6 10*3/uL (ref 4.0–10.5)

## 2021-01-24 LAB — VITAMIN D 25 HYDROXY (VIT D DEFICIENCY, FRACTURES): VITD: 58.35 ng/mL (ref 30.00–100.00)

## 2021-01-24 LAB — TSH: TSH: 0.51 u[IU]/mL (ref 0.35–5.50)

## 2021-01-24 LAB — LIPID PANEL
Cholesterol: 131 mg/dL (ref 0–200)
HDL: 61.1 mg/dL (ref >39.00)
LDL Cholesterol: 46 mg/dL (ref 0–99)
NonHDL: 70.27
Total CHOL/HDL Ratio: 2
Triglycerides: 120 mg/dL (ref 0.0–149.0)
VLDL: 24 mg/dL (ref 0.0–40.0)

## 2021-01-24 NOTE — Progress Notes (Signed)
Subjective:   Deborah Gardner is a 75 y.o. female who presents for Medicare Annual (Subsequent) preventive examination.  Review of Systems: N/A     I connected with the patient today by telephone and verified that I am speaking with the correct person using two identifiers. Location patient: home Location nurse: work Persons participating in the telephone visit: patient, nurse.   I discussed the limitations, risks, security and privacy concerns of performing an evaluation and management service by telephone and the availability of in person appointments. I also discussed with the patient that there may be a patient responsible charge related to this service. The patient expressed understanding and verbally consented to this telephonic visit.        Cardiac Risk Factors include: advanced age (>19men, >42 women);hypertension;Other (see comment), Risk factor comments: hyperlipidemia     Objective:    Today's Vitals   There is no height or weight on file to calculate BMI.  Advanced Directives 01/24/2021 05/02/2018 04/26/2017 04/25/2016  Does Patient Have a Medical Advance Directive? Yes Yes Yes Yes  Type of Paramedic of [place];Living will [place];Living will [place];Living will [place];Living will  Does patient want to make changes to medical advance directive? - - - No - Patient declined  Copy of [place] in Chart? No - copy requested No - copy requested No - copy requested No - copy requested    Current Medications (verified) Outpatient Encounter Medications as of 01/24/2021  Medication Sig   Ascorbic Acid (VITAMIN C) 1000 MG tablet Take 1,000 mg by mouth daily.   aspirin 81 MG tablet Take 81 mg by mouth daily.   Calcium Carb-Cholecalciferol (CALCIUM 600 + D PO) Take 1 tablet by mouth daily.   Cholecalciferol (VITAMIN D3) 25 MCG (1000 UT) CAPS Take 1 capsule by mouth in  the morning and at bedtime.   diphenhydrAMINE (BENADRYL) 25 MG tablet Take 25 mg by mouth at bedtime.   escitalopram (LEXAPRO) 10 MG tablet TAKE 1 TABLET BY MOUTH EVERY DAY   fluticasone (FLONASE) 50 MCG/ACT nasal spray PLACE 2 SPRAYS INTO THE NOSE DAILY. (Patient taking differently: 2 sprays. PLACE 2 SPRAYS INTO THE NOSE TWICE.)   hydrochlorothiazide (HYDRODIURIL) 25 MG tablet TAKE 1 TABLET BY MOUTH EVERY DAY   loratadine (CLARITIN) 10 MG tablet Take 10 mg by mouth daily.   Multiple Vitamins-Minerals (ZINC PO) Take 1 tablet by mouth daily.   omeprazole (PRILOSEC) 20 MG capsule TAKE ONE CAPSULE BY MOUTH ONCE A DAY BEFORE A MEAL   rosuvastatin (CRESTOR) 10 MG tablet TAKE 1 TABLET BY MOUTH EVERY DAY   timolol (TIMOPTIC) 0.5 % ophthalmic solution Place 1 drop into both eyes 2 (two) times daily.    triamcinolone cream (KENALOG) 0.1 % Apply 1 application topically 3 (three) times daily as needed.   amoxicillin-clavulanate (AUGMENTIN) 875-125 MG tablet Take 1 tablet by mouth 2 (two) times daily. (Patient not taking: Reported on 01/24/2021)   No facility-administered encounter medications on file as of 01/24/2021.    Allergies (verified) Atorvastatin, Lotrel [amlodipine besy-benazepril hcl], Sulfa antibiotics, and Amlodipine besylate   History: Past Medical History:  Diagnosis Date   Allergic rhinitis    Allergy    Arthritis    Cataract    bil   Colon polyps    Esophagitis    GERD (gastroesophageal reflux disease)    Glaucoma    HH (hiatus hernia)    HTN (hypertension)  Hyperlipidemia    Increased pressure in the eye    OA (osteoarthritis)    hands   Osteoporosis    Rectal pain    Past Surgical History:  Procedure Laterality Date   ABDOMINAL HYSTERECTOMY     still has ovaries   APPENDECTOMY     BREAST CYST ASPIRATION  1/03   CATARACT EXTRACTION  9/07   ESOPHAGOGASTRODUODENOSCOPY  11/08   HEMORRHOID SURGERY     rectal sphincterotomy     Retinal laser surgery     UPPER  GASTROINTESTINAL ENDOSCOPY     Family History  Problem Relation Age of Onset   Colon cancer Father    Pneumonia Father    Thyroid disease Mother    Depression Mother    Hypertension Mother    Osteoporosis Mother    Liver disease Brother        from alcohol   Breast cancer Paternal Aunt    Alcohol abuse Brother        terminal    Alcohol abuse Brother    Thyroid disease Other        half sister   Colon cancer Other        Aunt   Breast cancer Maternal Aunt    Breast cancer Paternal Aunt    Colon polyps Neg Hx    Esophageal cancer Neg Hx    Stomach cancer Neg Hx    Rectal cancer Neg Hx    Social History   Socioeconomic History   Marital status: Widowed    Spouse name: Not on file   Number of children: 2   Years of education: Not on file   Highest education level: Not on file  Occupational History   Occupation: Retired    Fish farm manager: RETIRED  Tobacco Use   Smoking status: Never   Smokeless tobacco: Never  Vaping Use   Vaping Use: Never used  Substance and Sexual Activity   Alcohol use: No    Alcohol/week: 0.0 standard drinks   Drug use: No   Sexual activity: Yes    Partners: Male    Birth control/protection: Surgical  Other Topics Concern   Not on file  Social History Narrative   Married      2 children      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 with husband,  and cares for her brother--who had alcohol and stroke      Cared for elderly mother for years (she passed in 2009)   Social Determinants of Health   Financial Resource Strain: Low Risk    Difficulty of Paying Living Expenses: Not hard at all  Food Insecurity: No Food Insecurity   Worried About Charity fundraiser in the Last Year: Never true   Arboriculturist in the Last Year: Never true  Transportation Needs: No Transportation Needs   Lack of Transportation (Medical): No   Lack of Transportation (Non-Medical): No  Physical Activity: Inactive   Days of Exercise per Week: 0 days   Minutes of  Exercise per Session: 0 min  Stress: No Stress Concern Present   Feeling of Stress : Not at all  Social Connections: Not on file    Tobacco Counseling Counseling given: Not Answered   Clinical Intake:  Pre-visit preparation completed: Yes  Pain : No/denies pain     Nutritional Risks: None Diabetes: No  How often do you need to have someone help you when you read instructions, pamphlets, or other written materials  from your doctor or pharmacy?: 1 - Never  Diabetic: No Nutrition Risk Assessment:  Has the patient had any N/V/D within the last 2 months?  No  Does the patient have any non-healing wounds?  No  Has the patient had any unintentional weight loss or weight gain?  No   Diabetes:  Is the patient diabetic?  No  If diabetic, was a CBG obtained today?   N/A Did the patient bring in their glucometer from home?   N/A How often do you monitor your CBG's? N/A.   Financial Strains and Diabetes Management:  Are you having any financial strains with the device, your supplies or your medication?  N/A .  Does the patient want to be seen by Chronic Care Management for management of their diabetes?   N/A Would the patient like to be referred to a Nutritionist or for Diabetic Management?   N/A   Interpreter Needed?: No  Information entered by :: CJohnson, RN   Activities of Daily Living In your present state of health, do you have any difficulty performing the following activities: 01/24/2021  Hearing? N  Vision? N  Difficulty concentrating or making decisions? N  Walking or climbing stairs? N  Dressing or bathing? N  Doing errands, shopping? N  Preparing Food and eating ? N  Using the Toilet? N  In the past six months, have you accidently leaked urine? Y  Comment wears pads at night  Do you have problems with loss of bowel control? N  Managing your Medications? N  Managing your Finances? N  Housekeeping or managing your Housekeeping? N  Some recent data might be  hidden    Patient Care Team: Tower, Wynelle [name], MD as PCP - General  Indicate any recent Medical Services you may have received from other than [facility] providers in the past year (date may be approximate).     Assessment:   This is a routine wellness examination for [place].  Hearing/Vision screen Vision Screening - Comments:: Patient gets annual eye exams   Dietary issues and exercise activities discussed: Current Exercise Habits: The patient does not participate in regular exercise at present, Exercise limited by: None identified   Goals Addressed             This Visit's Progress    Patient Stated       01/24/2021, I will maintain and continue medications as prescribed.       Depression Screen PHQ 2/9 Scores 01/24/2021 05/02/2018 04/26/2017 04/25/2016 12/02/2014 11/21/2013 06/18/2012  PHQ - 2 Score 0 0 0 0 0 0 0  PHQ- 9 Score 0 0 0 - - - -    Fall Risk Fall Risk  01/24/2021 02/12/2019 05/02/2018 04/26/2017 04/25/2016  Falls in the past year? 0 0 No No Yes  Comment - - - - accidental fall due to slippery shoes outside.  no injury.   Number falls in past yr: 0 0 - - 1  Injury with Fall? 0 0 - - No  Risk for fall due to : Medication side effect - - - History of fall(s)  Follow up Falls evaluation completed;Falls prevention discussed - - - Falls evaluation completed    FALL RISK PREVENTION PERTAINING TO THE HOME:  Any stairs in or around the home? Yes  If so, are there any without handrails? No  Home free of loose throw rugs in walkways, pet beds, electrical cords, etc? Yes  Adequate lighting in your home to reduce risk of falls? Yes  ASSISTIVE DEVICES UTILIZED TO PREVENT FALLS:  Life alert? No  Use of a cane, walker or w/c? No  Grab bars in the bathroom? No  Shower chair or bench in shower? No  Elevated toilet seat or a handicapped toilet? No   TIMED UP AND GO:  Was the test performed?  N/A telephone visit .  Cognitive Function: MMSE - Mini Mental State Exam  01/24/2021 05/02/2018 04/26/2017 04/25/2016  Orientation to time 5 5 5 5   Orientation to Place 5 5 5 5   Registration 3 3 3 3   Attention/ Calculation 5 0 0 0  Recall 3 3 3 3   Language- name 2 objects - 0 0 0  Language- repeat 1 1 1 1   Language- follow 3 step command - 3 3 3   Language- read & follow direction - 0 0 0  Write a sentence - 0 0 0  Copy design - 0 0 0  Total score - 20 20 20         Immunizations Immunization History  Administered Date(s) Administered   Fluad Quad(high Dose 65+) 03/18/2020   Influenza Split 03/11/2011, 03/28/2012, 04/10/2016   Influenza Whole 05/13/2010   Influenza, High Dose Seasonal PF 05/01/2013, 05/18/2014, 09/10/2015, 04/10/2016, 04/12/2017, 05/07/2018, 04/02/2019   PFIZER(Purple Top)SARS-COV-2 Vaccination 09/22/2019, 10/13/2019   Pneumococcal Conjugate-13 12/02/2014   Pneumococcal Polysaccharide-23 03/14/2011   Td 08/30/2004   Zoster, Live 11/24/2009    TDAP status: Due, Education has been provided regarding the importance of this vaccine. Advised may receive this vaccine at local pharmacy or Health Dept. Aware to provide a copy of the vaccination record if obtained from local pharmacy or Health Dept. Verbalized acceptance and understanding.  Flu Vaccine status: Up to date  Pneumococcal vaccine status: Up to date  Covid-19 vaccine status: Declined, Education has been provided regarding the importance of this vaccine but patient still declined. Advised may receive this vaccine at local pharmacy or Health Dept.or vaccine clinic. Aware to provide a copy of the vaccination record if obtained from local pharmacy or Health Dept. Verbalized acceptance and understanding.  Qualifies for Shingles Vaccine? Yes   Zostavax completed Yes   Shingrix Completed?: No.    Education has been provided regarding the importance of this vaccine. Patient has been advised to call insurance company to determine out of pocket expense if they have not yet received this  vaccine. Advised may also receive vaccine at local pharmacy or Health Dept. Verbalized acceptance and understanding.  Screening Tests Health Maintenance  Topic Date Due   COVID-19 Vaccine (3 - Booster for Pfizer series) 02/09/2021 (Originally 03/14/2020)   Zoster Vaccines- Shingrix (1 of 2) 04/26/2021 (Originally 07/13/1993)   TETANUS/TDAP  08/29/2024 (Originally 08/30/2014)   INFLUENZA VACCINE  02/07/2021   MAMMOGRAM  03/25/2021   COLONOSCOPY (Pts 45-43yrs Insurance coverage will need to be confirmed)  06/27/2023   DEXA SCAN  Completed   Hepatitis C Screening  Completed   PNA vac Low Risk Adult  Completed   HPV VACCINES  Aged Out    Health Maintenance  There are no preventive care reminders to display for this patient.   Colorectal cancer screening: Type of screening: Colonoscopy. Completed 06/26/2018. Repeat every 5 years  Mammogram status: Completed 03/25/2020. Repeat every year  Bone Density status: due, will call and schedule along with mammogram this year  Lung Cancer Screening: (Low Dose CT Chest recommended if Age 57-80 years, 30 pack-year currently smoking OR have quit w/in 15years.) does not qualify.  Additional Screening:  Hepatitis C  Screening: does qualify; Completed 04/25/2016  Vision Screening: Recommended annual ophthalmology exams for early detection of glaucoma and other disorders of the eye. Is the patient up to date with their annual eye exam?  Yes  Who is the provider or what is the name of the office in which the patient attends annual eye exams? Dr. Luretha Rued If pt is not established with a provider, would they like to be referred to a provider to establish care? No .   Dental Screening: Recommended annual dental exams for proper oral hygiene  Community Resource Referral / Chronic Care Management: CRR required this visit?  No   CCM required this visit?  No      Plan:     I have personally reviewed and noted the following in the patient's chart:    Medical and social history Use of alcohol, tobacco or illicit drugs  Current medications and supplements including opioid prescriptions.  Functional ability and status Nutritional status Physical activity Advanced directives List of other physicians Hospitalizations, surgeries, and ER visits in previous 12 months Vitals Screenings to include cognitive, depression, and falls Referrals and appointments  In addition, I have reviewed and discussed with patient certain preventive protocols, quality metrics, and best practice recommendations. A written personalized care plan for preventive services as well as general preventive health recommendations were provided to patient.   Due to this being a telephonic visit, the after visit summary with patients personalized plan was offered to patient via office or my-chart. Patient preferred to pick up at office at next visit or via mychart.   Andrez Grime, LPN   10/29/310

## 2021-01-24 NOTE — Patient Instructions (Signed)
Deborah Gardner , Thank you for taking time to come for your Medicare Wellness Visit. I appreciate your ongoing commitment to your health goals. Please review the following plan we discussed and let me know if I can assist you in the future.   Screening recommendations/referrals: Colonoscopy: Up to date, completed 06/26/2018, due 06/2023 Mammogram: Up to date, completed 03/25/2020, due 03/2021 Bone Density: due, will call and schedule along with mammogram this year Recommended yearly ophthalmology/optometry visit for glaucoma screening and checkup Recommended yearly dental visit for hygiene and checkup  Vaccinations: Influenza vaccine: Up to date, completed 03/18/2020, due 02/2021 Pneumococcal vaccine: Completed series Tdap vaccine: decline-insurance  Shingles vaccine: declined  Covid-19:declined  Advanced directives: Please bring a copy of your POA (Power of Attorney) and/or Living Will to your next appointment.   Conditions/risks identified: hypertension, hyperlipidemia   Next appointment: Follow up in one year for your annual wellness visit    Preventive Care 65 Years and Older, Female Preventive care refers to lifestyle choices and visits with your health care provider that can promote health and wellness. What does preventive care include? A yearly physical exam. This is also called an annual well check. Dental exams once or twice a year. Routine eye exams. Ask your health care provider how often you should have your eyes checked. Personal lifestyle choices, including: Daily care of your teeth and gums. Regular physical activity. Eating a healthy diet. Avoiding tobacco and drug use. Limiting alcohol use. Practicing safe sex. Taking low-dose aspirin every day. Taking vitamin and mineral supplements as recommended by your health care provider. What happens during an annual well check? The services and screenings done by your health care provider during your annual well check will  depend on your age, overall health, lifestyle risk factors, and family history of disease. Counseling  Your health care provider may ask you questions about your: Alcohol use. Tobacco use. Drug use. Emotional well-being. Home and relationship well-being. Sexual activity. Eating habits. History of falls. Memory and ability to understand (cognition). Work and work Statistician. Reproductive health. Screening  You may have the following tests or measurements: Height, weight, and BMI. Blood pressure. Lipid and cholesterol levels. These may be checked every 5 years, or more frequently if you are over 58 years old. Skin check. Lung cancer screening. You may have this screening every year starting at age 77 if you have a 30-pack-year history of smoking and currently smoke or have quit within the past 15 years. Fecal occult blood test (FOBT) of the stool. You may have this test every year starting at age 77. Flexible sigmoidoscopy or colonoscopy. You may have a sigmoidoscopy every 5 years or a colonoscopy every 10 years starting at age 77. Hepatitis C blood test. Hepatitis B blood test. Sexually transmitted disease (STD) testing. Diabetes screening. This is done by checking your blood sugar (glucose) after you have not eaten for a while (fasting). You may have this done every 1-3 years. Bone density scan. This is done to screen for osteoporosis. You may have this done starting at age 77. Mammogram. This may be done every 77 years. Talk to your health care provider about how often you should have regular mammograms. Talk with your health care provider about your test results, treatment options, and if necessary, the need for more tests. Vaccines  Your health care provider may recommend certain vaccines, such as: Influenza vaccine. This is recommended every year. Tetanus, diphtheria, and acellular pertussis (Tdap, Td) vaccine. You may need a Td booster  every 10 years. Zoster vaccine. You may  need this after age 19. Pneumococcal 13-valent conjugate (PCV13) vaccine. One dose is recommended after age 76. Pneumococcal polysaccharide (PPSV23) vaccine. One dose is recommended after age 66. Talk to your health care provider about which screenings and vaccines you need and how often you need them. This information is not intended to replace advice given to you by your health care provider. Make sure you discuss any questions you have with your health care provider. Document Released: 07/23/2015 Document Revised: 03/15/2016 Document Reviewed: 04/27/2015 Elsevier Interactive Patient Education  2017 [place] Prevention in the Home Falls can cause injuries. They can happen to people of all ages. There are many things you can do to make your home safe and to help prevent falls. What can I do on the outside of my home? Regularly fix the edges of walkways and driveways and fix any cracks. Remove anything that might make you trip as you walk through a door, such as a raised step or threshold. Trim any bushes or trees on the path to your home. Use bright outdoor lighting. Clear any walking paths of anything that might make someone trip, such as rocks or tools. Regularly check to see if handrails are loose or broken. Make sure that both sides of any steps have handrails. Any raised decks and porches should have guardrails on the edges. Have any leaves, snow, or ice cleared regularly. Use sand or salt on walking paths during winter. Clean up any spills in your garage right away. This includes oil or grease spills. What can I do in the bathroom? Use night lights. Install grab bars by the toilet and in the tub and shower. Do not use towel bars as grab bars. Use non-skid mats or decals in the tub or shower. If you need to sit down in the shower, use a plastic, non-slip stool. Keep the floor dry. Clean up any water that spills on the floor as soon as it happens. Remove soap buildup in  the tub or shower regularly. Attach bath mats securely with double-sided non-slip rug tape. Do not have throw rugs and other things on the floor that can make you trip. What can I do in the bedroom? Use night lights. Make sure that you have a light by your bed that is easy to reach. Do not use any sheets or blankets that are too big for your bed. They should not hang down onto the floor. Have a firm chair that has side arms. You can use this for support while you get dressed. Do not have throw rugs and other things on the floor that can make you trip. What can I do in the kitchen? Clean up any spills right away. Avoid walking on wet floors. Keep items that you use a lot in easy-to-reach places. If you need to reach something above you, use a strong step stool that has a grab bar. Keep electrical cords out of the way. Do not use floor polish or wax that makes floors slippery. If you must use wax, use non-skid floor wax. Do not have throw rugs and other things on the floor that can make you trip. What can I do with my stairs? Do not leave any items on the stairs. Make sure that there are handrails on both sides of the stairs and use them. Fix handrails that are broken or loose. Make sure that handrails are as long as the stairways. Check any carpeting to  make sure that it is firmly attached to the stairs. Fix any carpet that is loose or worn. Avoid having throw rugs at the top or bottom of the stairs. If you do have throw rugs, attach them to the floor with carpet tape. Make sure that you have a light switch at the top of the stairs and the bottom of the stairs. If you do not have them, ask someone to add them for you. What else can I do to help prevent falls? Wear shoes that: Do not have high heels. Have rubber bottoms. Are comfortable and fit you well. Are closed at the toe. Do not wear sandals. If you use a stepladder: Make sure that it is fully opened. Do not climb a closed  stepladder. Make sure that both sides of the stepladder are locked into place. Ask someone to hold it for you, if possible. Clearly mark and make sure that you can see: Any grab bars or handrails. First and last steps. Where the edge of each step is. Use tools that help you move around (mobility aids) if they are needed. These include: Canes. Walkers. Scooters. Crutches. Turn on the lights when you go into a dark area. Replace any light bulbs as soon as they burn out. Set up your furniture so you have a clear path. Avoid moving your furniture around. If any of your floors are uneven, fix them. If there are any pets around you, be aware of where they are. Review your medicines with your doctor. Some medicines can make you feel dizzy. This can increase your chance of falling. Ask your doctor what other things that you can do to help prevent falls. This information is not intended to replace advice given to you by your health care provider. Make sure you discuss any questions you have with your health care provider. Document Released: 04/22/2009 Document Revised: 12/02/2015 Document Reviewed: 07/31/2014 Elsevier Interactive Patient Education  2017 [employer].

## 2021-01-24 NOTE — Progress Notes (Signed)
PCP notes:  Health Maintenance: Shingrix- declined Covid booster- declined Dexa- due    Abnormal Screenings: none   Patient concerns: Discuss having repeat colonoscopy   Nurse concerns: none   Next PCP appt.: 02/02/2021 @ 2:30 pm

## 2021-01-27 ENCOUNTER — Other Ambulatory Visit: Payer: Self-pay | Admitting: Family Medicine

## 2021-02-02 ENCOUNTER — Encounter: Payer: Medicare PPO | Admitting: Family Medicine

## 2021-02-09 DIAGNOSIS — M9903 Segmental and somatic dysfunction of lumbar region: Secondary | ICD-10-CM | POA: Diagnosis not present

## 2021-02-09 DIAGNOSIS — M9904 Segmental and somatic dysfunction of sacral region: Secondary | ICD-10-CM | POA: Diagnosis not present

## 2021-02-09 DIAGNOSIS — M5136 Other intervertebral disc degeneration, lumbar region: Secondary | ICD-10-CM | POA: Diagnosis not present

## 2021-02-09 DIAGNOSIS — M9905 Segmental and somatic dysfunction of pelvic region: Secondary | ICD-10-CM | POA: Diagnosis not present

## 2021-02-14 ENCOUNTER — Other Ambulatory Visit: Payer: Self-pay

## 2021-02-14 ENCOUNTER — Encounter: Payer: Self-pay | Admitting: Family Medicine

## 2021-02-14 ENCOUNTER — Ambulatory Visit (INDEPENDENT_AMBULATORY_CARE_PROVIDER_SITE_OTHER): Payer: Medicare PPO | Admitting: Family Medicine

## 2021-02-14 VITALS — BP 140/76 | HR 51 | Temp 97.6°F | Ht 60.0 in | Wt 149.1 lb

## 2021-02-14 DIAGNOSIS — Z0000 Encounter for general adult medical examination without abnormal findings: Secondary | ICD-10-CM | POA: Diagnosis not present

## 2021-02-14 DIAGNOSIS — M85851 Other specified disorders of bone density and structure, right thigh: Secondary | ICD-10-CM | POA: Diagnosis not present

## 2021-02-14 DIAGNOSIS — M85852 Other specified disorders of bone density and structure, left thigh: Secondary | ICD-10-CM | POA: Diagnosis not present

## 2021-02-14 DIAGNOSIS — E7800 Pure hypercholesterolemia, unspecified: Secondary | ICD-10-CM | POA: Diagnosis not present

## 2021-02-14 DIAGNOSIS — E0590 Thyrotoxicosis, unspecified without thyrotoxic crisis or storm: Secondary | ICD-10-CM | POA: Diagnosis not present

## 2021-02-14 DIAGNOSIS — I10 Essential (primary) hypertension: Secondary | ICD-10-CM

## 2021-02-14 NOTE — Assessment & Plan Note (Signed)
Reviewed health habits including diet and exercise and skin cancer prevention Reviewed appropriate screening tests for age  Also reviewed health mt list, fam hx and immunization status , as well as social and family history   See HPI Labs reviewed  amw reviewed  Declines shingrix vaccine  Mammogram due next month-pt has reminder to schedule  Colonoscopy utd (polyps and fam hx) dexa- declines (no falls or fx, counseled on OP prev)

## 2021-02-14 NOTE — Patient Instructions (Addendum)
Don't forget to schedule your mammogram    Think about adding some exercise for bone health  Anything weight bearing is helpful   Make sure to get protein in diet  Dairy, nuts, nut butters, soy, dried beans in addition to meat   Take care of yourself  Stay active physically and mentally

## 2021-02-14 NOTE — Assessment & Plan Note (Signed)
dexa 12/19 Declines treatment or another dexa No falls or fx Disc fall prevention  Takes ca and D  Plans to get more wt bearing exercise

## 2021-02-14 NOTE — Assessment & Plan Note (Signed)
bp in fair control at this time  BP Readings from Last 1 Encounters:  02/14/21 140/76   No changes needed Most recent labs reviewed  Disc lifstyle change with low sodium diet and exercise  Plan to continue hctz 25 mg daily

## 2021-02-14 NOTE — Assessment & Plan Note (Signed)
Disc goals for lipids and reasons to control them Rev last labs with pt Rev low sat fat diet in detail LDL of 46- well controlled  Plan to continue crestor 10 mg daily

## 2021-02-14 NOTE — Progress Notes (Signed)
Subjective:    Patient ID: Deborah Gardner, female    DOB: 17-Oct-1945, 75 y.o.   MRN: [id number]  This visit occurred during the SARS-CoV-2 public health emergency.  Safety protocols were in place, including screening questions prior to the visit, additional usage of staff PPE, and extensive cleaning of exam room while observing appropriate contact time as indicated for disinfecting solutions.   HPI Here for health maintenance exam and to review chronic medical problems    Wt Readings from Last 3 Encounters:  02/14/21 149 lb 1 oz (67.6 kg)  10/26/20 153 lb 3.2 oz (69.5 kg)  08/05/20 151 lb 5 oz (68.6 kg)   29.11 kg/m  Feeling pretty good given her age  Not as much energy given her age   Had amw on 7/18   Covid immunized Gets flu shot every fall   Declines shingrix  Mammogram 9/21 Self breast exam -no lumps  Several aunts with breast cancer  Colonoscopy 12/19 with 5 y recall  Father had colon cancer    Dexa 12/19 -osteopenia Declines treatment  Declines another dexa  Falls- none Fractures-none Supplements - ca and D Exercise -not as much as she should but is active   She goes to the chiropractor-for scoliosis   HTN bp is stable today  No cp or palpitations or headaches or edema  No side effects to medicines  BP Readings from Last 3 Encounters:  02/14/21 140/76  11/17/20 (!) 148/80  10/26/20 (!) 142/80     Hctz 25 mg daily   Lab Results  Component Value Date   CREATININE 0.78 01/24/2021   BUN 12 01/24/2021   NA 141 01/24/2021   K 4.1 01/24/2021   CL 103 01/24/2021   CO2 32 01/24/2021    Subclinical hyperthyroidism in the past Nl tsh today  Lab Results  Component Value Date   TSH 0.51 01/24/2021     Vit D level 58.3   Hyperlipidemia Lab Results  Component Value Date   CHOL 131 01/24/2021   CHOL 140 02/17/2020   CHOL 151 02/10/2019   Lab Results  Component Value Date   HDL 61.10 01/24/2021   HDL 76.30 02/17/2020   HDL 71.20 02/10/2019    Lab Results  Component Value Date   LDLCALC 46 01/24/2021   LDLCALC 52 02/17/2020   LDLCALC 56 02/10/2019   Lab Results  Component Value Date   TRIG 120.0 01/24/2021   TRIG 59.0 02/17/2020   TRIG 122.0 02/10/2019   Lab Results  Component Value Date   CHOLHDL 2 01/24/2021   CHOLHDL 2 02/17/2020   CHOLHDL 2 02/10/2019   No results found for: LDLDIRECT  Crestor 10 mg daily  Eats healthy  Less meat (had alpha gal)- able to eat some things  More vegetables   Lab Results  Component Value Date   ALT 8 01/24/2021   AST 13 01/24/2021   ALKPHOS 55 01/24/2021   BILITOT 0.5 01/24/2021   Lab Results  Component Value Date   WBC 7.6 01/24/2021   HGB 12.0 01/24/2021   HCT 35.2 (L) 01/24/2021   MCV 89.1 01/24/2021   PLT 202.0 01/24/2021   Lab Results  Component Value Date   ALT 8 01/24/2021   AST 13 01/24/2021   ALKPHOS 55 01/24/2021   BILITOT 0.5 01/24/2021    Patient Active Problem List   Diagnosis Date Noted   Allergic reaction to alpha-gal 09/06/2020   Urticaria 01/13/2020   UTI (urinary tract infection) 08/11/2019  Dizziness 01/21/2019   Nasal congestion 01/21/2019   Estrogen deficiency 05/07/2018   Pelvic prolapse 05/07/2018   Overactive bladder 05/07/2018   Memory change 11/03/2016   Osteoarthritis of right knee 05/22/2016   Routine general medical examination at a health care facility 12/02/2014   Encounter for Medicare annual wellness exam 11/21/2013   Insomnia 11/21/2013   Subclinical hyperthyroidism 06/18/2012   Pruritus ani 03/08/2011   UNSPECIFIED ESOPHAGITIS 08/12/2010   OSTEOARTHRITIS, GENERALIZED, HAND 03/21/2010   PATELLO-FEMORAL SYNDROME 03/21/2010   ROTATOR CUFF SYNDROME, LEFT 03/21/2010   ALLERGIC RHINITIS 11/03/2008   ARTHRALGIA 10/08/2008   ADENOMATOUS COLONIC POLYP 08/19/2007   ESOPHAGITIS, REFLUX 08/19/2007   HIATAL HERNIA 08/19/2007   Hyperlipidemia 03/05/2007   G E R D 03/05/2007   Osteopenia 03/05/2007   Essential hypertension  02/08/2007   IBS 02/08/2007   FIBROCYSTIC BREAST DISEASE 02/08/2007   OSTEOARTHRITIS 02/08/2007   [place] DISEASE, LUMBAR 02/08/2007   STRESS INCONTINENCE 02/08/2007   Past Medical History:  Diagnosis Date   Allergic rhinitis    Allergy    Arthritis    Cataract    bil   Colon polyps    Esophagitis    GERD (gastroesophageal reflux disease)    Glaucoma    HH (hiatus hernia)    HTN (hypertension)    Hyperlipidemia    Increased pressure in the eye    OA (osteoarthritis)    hands   Osteoporosis    Rectal pain    Past Surgical History:  Procedure Laterality Date   ABDOMINAL HYSTERECTOMY     still has ovaries   APPENDECTOMY     BREAST CYST ASPIRATION  1/03   CATARACT EXTRACTION  9/07   ESOPHAGOGASTRODUODENOSCOPY  11/08   HEMORRHOID SURGERY     rectal sphincterotomy     Retinal laser surgery     UPPER GASTROINTESTINAL ENDOSCOPY     Social History   Tobacco Use   Smoking status: Never   Smokeless tobacco: Never  Vaping Use   Vaping Use: Never used  Substance Use Topics   Alcohol use: No    Alcohol/week: 0.0 standard drinks   Drug use: No   Family History  Problem Relation Age of Onset   Colon cancer Father    Pneumonia Father    Thyroid disease Mother    Depression Mother    Hypertension Mother    Osteoporosis Mother    Liver disease Brother        from alcohol   Breast cancer Paternal Aunt    Alcohol abuse Brother        terminal    Alcohol abuse Brother    Thyroid disease Other        half sister   Colon cancer Other        Aunt   Breast cancer Maternal Aunt    Breast cancer Paternal Aunt    Colon polyps Neg Hx    Esophageal cancer Neg Hx    Stomach cancer Neg Hx    Rectal cancer Neg Hx    Allergies  Allergen Reactions   Atorvastatin Other (See Comments)    Memory loss   Lotrel [Amlodipine Besy-Benazepril Hcl] Hives    Most likely from the benazepril    Sulfa Antibiotics     Lip swelling   Amlodipine Besylate Rash   Current Outpatient  Medications on File Prior to Visit  Medication Sig Dispense Refill   aspirin 81 MG tablet Take 81 mg by mouth daily.  Calcium Carb-Cholecalciferol (CALCIUM 600 + D PO) Take 1 tablet by mouth daily.     Cholecalciferol (VITAMIN D3) 25 MCG (1000 UT) CAPS Take 1 capsule by mouth in the morning and at bedtime.     escitalopram (LEXAPRO) 10 MG tablet TAKE 1 TABLET BY MOUTH EVERY DAY 90 tablet 0   fluticasone (FLONASE) 50 MCG/ACT nasal spray PLACE 2 SPRAYS INTO THE NOSE DAILY. (Patient taking differently: 2 sprays. PLACE 2 SPRAYS INTO THE NOSE TWICE.) 48 mL 2   hydrochlorothiazide (HYDRODIURIL) 25 MG tablet TAKE 1 TABLET BY MOUTH EVERY DAY 90 tablet 1   loratadine (CLARITIN) 10 MG tablet Take 10 mg by mouth daily as needed.     omeprazole (PRILOSEC) 20 MG capsule TAKE ONE CAPSULE BY MOUTH ONCE A DAY BEFORE A MEAL 90 capsule 0   rosuvastatin (CRESTOR) 10 MG tablet TAKE 1 TABLET BY MOUTH EVERY DAY 90 tablet 3   timolol (TIMOPTIC) 0.5 % ophthalmic solution Place 1 drop into both eyes 2 (two) times daily.      triamcinolone cream (KENALOG) 0.1 % Apply 1 application topically 3 (three) times daily as needed.     No current facility-administered medications on file prior to visit.      Review of Systems  Constitutional:  Negative for activity change, appetite change, fatigue, fever and unexpected weight change.  HENT:  Negative for congestion, ear pain, rhinorrhea, sinus pressure and sore throat.   Eyes:  Negative for pain, redness and visual disturbance.  Respiratory:  Negative for cough, shortness of breath and wheezing.   Cardiovascular:  Negative for chest pain and palpitations.  Gastrointestinal:  Negative for abdominal pain, blood in stool, constipation and diarrhea.  Endocrine: Negative for polydipsia and polyuria.  Genitourinary:  Negative for dysuria, frequency and urgency.  Musculoskeletal:  Positive for arthralgias. Negative for back pain and myalgias.  Skin:  Negative for pallor and  rash.  Allergic/Immunologic: Negative for environmental allergies.  Neurological:  Negative for dizziness, syncope and headaches.  Hematological:  Negative for adenopathy. Does not bruise/bleed easily.  Psychiatric/Behavioral:  Negative for decreased concentration and dysphoric mood. The patient is not nervous/anxious.       Objective:   Physical Exam Constitutional:      General: She is not in acute distress.    Appearance: Normal appearance. She is well-developed. She is not ill-appearing or diaphoretic.     Comments: overwt  HENT:     Head: Normocephalic and atraumatic.     Right Ear: Tympanic membrane, ear canal and external ear normal.     Left Ear: Tympanic membrane, ear canal and external ear normal.     Nose: Nose normal. No congestion.     Mouth/Throat:     Mouth: Mucous membranes are moist.     Pharynx: Oropharynx is clear. No posterior oropharyngeal erythema.  Eyes:     General: No scleral icterus.    Extraocular Movements: Extraocular movements intact.     Conjunctiva/sclera: Conjunctivae normal.     Pupils: Pupils are equal, round, and reactive to light.  Neck:     Thyroid: No thyromegaly.     Vascular: No carotid bruit or JVD.  Cardiovascular:     Rate and Rhythm: Normal rate and regular rhythm.     Pulses: Normal pulses.     Heart sounds: Normal heart sounds.    No gallop.  Pulmonary:     Effort: Pulmonary effort is normal. No respiratory distress.     Breath sounds: Normal breath sounds.  No wheezing.     Comments: Good air exch Chest:     Chest wall: No tenderness.  Abdominal:     General: Bowel sounds are normal. There is no distension or abdominal bruit.     Palpations: Abdomen is soft. There is no mass.     Tenderness: There is no abdominal tenderness.     Hernia: No hernia is present.  Genitourinary:    Comments: Breast exam: No mass, nodules, thickening, tenderness, bulging, retraction, inflamation, nipple discharge or skin changes noted.  No  axillary or clavicular LA.     Musculoskeletal:        General: No tenderness. Normal range of motion.     Cervical back: Normal range of motion and neck supple. No rigidity. No muscular tenderness.     Right lower leg: No edema.     Left lower leg: No edema.  Lymphadenopathy:     Cervical: No cervical adenopathy.  Skin:    General: Skin is warm and dry.     Coloration: Skin is not pale.     Findings: No erythema or rash.     Comments: Some lentigines and sks  Neurological:     Mental Status: She is alert. Mental status is at baseline.     Cranial Nerves: No cranial nerve deficit.     Motor: No abnormal muscle tone.     Coordination: Coordination normal.     Gait: Gait normal.     Deep Tendon Reflexes: Reflexes are normal and symmetric. Reflexes normal.  Psychiatric:        Mood and Affect: Mood normal.        Cognition and Memory: Cognition and memory normal.          Assessment & Plan:   Problem List Items Addressed This Visit       Cardiovascular and Mediastinum   Essential hypertension    bp in fair control at this time  BP Readings from Last 1 Encounters:  02/14/21 140/76  No changes needed Most recent labs reviewed  Disc lifstyle change with low sodium diet and exercise  Plan to continue hctz 25 mg daily        Endocrine   Subclinical hyperthyroidism    Nl TSH today  Lab Results  Component Value Date   TSH 0.51 01/24/2021   No clinical changes         Musculoskeletal and Integument   Osteopenia    dexa 12/19 Declines treatment or another dexa No falls or fx Disc fall prevention  Takes ca and D  Plans to get more wt bearing exercise          Other   Hyperlipidemia    Disc goals for lipids and reasons to control them Rev last labs with pt Rev low sat fat diet in detail LDL of 46- well controlled  Plan to continue crestor 10 mg daily          Routine general medical examination at a health care facility - Primary    Reviewed health  habits including diet and exercise and skin cancer prevention Reviewed appropriate screening tests for age  Also reviewed health mt list, fam hx and immunization status , as well as social and family history   See HPI Labs reviewed  amw reviewed  Declines shingrix vaccine  Mammogram due next month-pt has reminder to schedule  Colonoscopy utd (polyps and fam hx) dexa- declines (no falls or fx, counseled on OP prev)

## 2021-02-14 NOTE — Assessment & Plan Note (Signed)
Nl TSH today  Lab Results  Component Value Date   TSH 0.51 01/24/2021   No clinical changes

## 2021-03-16 ENCOUNTER — Other Ambulatory Visit: Payer: Self-pay

## 2021-03-16 ENCOUNTER — Telehealth: Payer: Medicare PPO | Admitting: Medical

## 2021-03-16 VITALS — Wt 150.0 lb

## 2021-03-16 DIAGNOSIS — J0110 Acute frontal sinusitis, unspecified: Secondary | ICD-10-CM

## 2021-03-16 DIAGNOSIS — J3489 Other specified disorders of nose and nasal sinuses: Secondary | ICD-10-CM | POA: Diagnosis not present

## 2021-03-16 DIAGNOSIS — R110 Nausea: Secondary | ICD-10-CM

## 2021-03-16 MED ORDER — ONDANSETRON 4 MG PO TBDP
4.0000 mg | ORAL_TABLET | Freq: Three times a day (TID) | ORAL | 0 refills | Status: DC | PRN
Start: 2021-03-16 — End: 2021-09-27

## 2021-03-16 MED ORDER — AMOXICILLIN 875 MG PO TABS: 875.0000 mg | ORAL_TABLET | Freq: Two times a day (BID) | ORAL | 0 refills | Status: AC

## 2021-03-16 NOTE — Progress Notes (Signed)
Subjective:     Patient ID: Deborah Gardner, female   DOB: January 08, 1946, 75 y.o.   MRN: [id number]  This visit type was conducted due to national recommendations for restrictions regarding the COVID-19 Pandemic (e.g. social distancing) in an effort to limit this patient's exposure and mitigate transmission in our community.  Due to their co-morbid illnesses, this patient is at least at moderate risk for complications without adequate follow up.  This format is felt to be most appropriate for this patient at this time.    Documentation for virtual audio and video telecommunications through [place] encounter:  The patient was located at home. The provider was located in the office. The patient did consent to this visit and is aware of possible charges through their insurance for this visit.  The other persons participating in this telemedicine service were none. Time spent on call was 20 minutes and in review of previous records 20 minutes total.  This virtual service is not related to other E/M service within previous 7 days.   HPI Chief Complaint  Patient presents with   sinus infection    Sinus infections- symptoms- over week ago. Off balance, nausea, dizzy, gets this 1-2 twice a year. Sinus pressure. Having some blurred vision   Virtual consult for possible sinus infection.  She notes feeling bad for over a week now.  She notes sinus pressure, nausea, dizzy, feels like prior sinus infections.   Has pressure in ears.   Feels worse between eyes.  No sore throat.  No fever.  No vomiting.   No diarrhea.  No cough.  No body aches or chills.  Gets similar about once or twice per year.  No sick contacts.   Using some coricidin for flu and cold.  Has recent negative covid tests this week.  Been using Flonase in morning and saline nasal in evening.  No other aggravating or relieving factors. No other complaint.  Past Medical History:  Diagnosis Date   Allergic rhinitis    Allergy     Arthritis    Cataract    bil   Colon polyps    Esophagitis    GERD (gastroesophageal reflux disease)    Glaucoma    HH (hiatus hernia)    HTN (hypertension)    Hyperlipidemia    Increased pressure in the eye    OA (osteoarthritis)    hands   Osteoporosis    Rectal pain    Current Outpatient Medications on File Prior to Visit  Medication Sig Dispense Refill   aspirin 81 MG tablet Take 81 mg by mouth daily.     Calcium Carb-Cholecalciferol (CALCIUM 600 + D PO) Take 1 tablet by mouth daily.     Cholecalciferol (VITAMIN D3) 25 MCG (1000 UT) CAPS Take 1 capsule by mouth in the morning and at bedtime.     escitalopram (LEXAPRO) 10 MG tablet TAKE 1 TABLET BY MOUTH EVERY DAY 90 tablet 0   fluticasone (FLONASE) 50 MCG/ACT nasal spray PLACE 2 SPRAYS INTO THE NOSE DAILY. (Patient taking differently: 2 sprays. PLACE 2 SPRAYS INTO THE NOSE TWICE.) 48 mL 2   hydrochlorothiazide (HYDRODIURIL) 25 MG tablet TAKE 1 TABLET BY MOUTH EVERY DAY 90 tablet 1   loratadine (CLARITIN) 10 MG tablet Take 10 mg by mouth daily as needed.     omeprazole (PRILOSEC) 20 MG capsule TAKE ONE CAPSULE BY MOUTH ONCE A DAY BEFORE A MEAL 90 capsule 0   rosuvastatin (CRESTOR) 10 MG tablet TAKE 1 TABLET  BY MOUTH EVERY DAY 90 tablet 3   timolol (TIMOPTIC) 0.5 % ophthalmic solution Place 1 drop into both eyes 2 (two) times daily.      No current facility-administered medications on file prior to visit.     Review of Systems As in subjective    Objective:   Physical Exam Due to coronavirus pandemic stay at home measures, patient visit was virtual and they were not examined in person.   Wt 150 lb (68 kg)   BMI 29.29 kg/m   Gen: wd, wn No labored breathing or obvious wheezing      Assessment:     Encounter Diagnoses  Name Primary?   Sinus pressure Yes   Acute non-recurrent frontal sinusitis    Nausea        Plan:     We discussed symptoms and concerns.  I advised I cannot completely rule out COVID.  She  does note home COVID test negative this week.  Symptoms have persisted for more than a week and a half.  Begin medication as below.  Continue good hydration, over-the-counter supportive therapy as discussed, nasal saline.  If not much improved in the next 3 to 4 days then call back or recheck.  [name] was seen today for sinus infection.  Diagnoses and all orders for this visit:  Sinus pressure  Acute non-recurrent frontal sinusitis  Nausea  Other orders -     amoxicillin (AMOXIL) 875 MG tablet; Take 1 tablet (875 mg total) by mouth 2 (two) times daily for 10 days. -     ondansetron (ZOFRAN ODT) 4 MG disintegrating tablet; Take 1 tablet (4 mg total) by mouth every 8 (eight) hours as needed for nausea or vomiting. F/u prn

## 2021-03-21 ENCOUNTER — Ambulatory Visit: Payer: Medicare PPO | Admitting: Medical

## 2021-03-21 ENCOUNTER — Other Ambulatory Visit: Payer: Self-pay

## 2021-03-21 VITALS — BP 120/70 | HR 67 | Temp 97.9°F | Wt 151.6 lb

## 2021-03-21 DIAGNOSIS — H6121 Impacted cerumen, right ear: Secondary | ICD-10-CM | POA: Diagnosis not present

## 2021-03-21 DIAGNOSIS — R140 Abdominal distension (gaseous): Secondary | ICD-10-CM

## 2021-03-21 DIAGNOSIS — R42 Dizziness and giddiness: Secondary | ICD-10-CM

## 2021-03-21 DIAGNOSIS — J3489 Other specified disorders of nose and nasal sinuses: Secondary | ICD-10-CM

## 2021-03-21 MED ORDER — PREDNISONE 10 MG PO TABS: ORAL_TABLET | ORAL | 0 refills | Status: DC

## 2021-03-21 NOTE — Progress Notes (Signed)
Subjective:     Patient ID: Deborah Gardner, female   DOB: October 31, 1945, 75 y.o.   MRN: [id number]  HPI Chief Complaint  Patient presents with   Dizziness    Dizziness for about 3 weeks. Feeling woozy headed. Feeling sick on stomach. Antibiotic helped some. Getting vertigo/inner ear a couple times a year.    Here for dizziness.   We did a virtual consult last week for same.  She was seen virtually last week for sinus pressure, sinus infection.   Does feel some improved, but still feels bad in head, pressure, some dizziness.  No fever, no confusion, no slurred speech, no numbness, no tingling, no weakness.  No vision or hearing changes.  No chest pain, palpations.  No edema.   Belly has felt somewhat full and bloated.  Did not have BM for 3 days but had small BM this morning.  Was on coricidin but quit taking this.  No other aggravating or relieving factors. No other complaint.  Past Medical History:  Diagnosis Date   Allergic rhinitis    Allergy    Arthritis    Cataract    bil   Colon polyps    Esophagitis    GERD (gastroesophageal reflux disease)    Glaucoma    HH (hiatus hernia)    HTN (hypertension)    Hyperlipidemia    Increased pressure in the eye    OA (osteoarthritis)    hands   Osteoporosis    Rectal pain    Current Outpatient Medications on File Prior to Visit  Medication Sig Dispense Refill   amoxicillin (AMOXIL) 875 MG tablet Take 1 tablet (875 mg total) by mouth 2 (two) times daily for 10 days. 20 tablet 0   aspirin 81 MG tablet Take 81 mg by mouth daily.     Calcium Carb-Cholecalciferol (CALCIUM 600 + D PO) Take 1 tablet by mouth daily.     Cholecalciferol (VITAMIN D3) 25 MCG (1000 UT) CAPS Take 1 capsule by mouth in the morning and at bedtime.     escitalopram (LEXAPRO) 10 MG tablet TAKE 1 TABLET BY MOUTH EVERY DAY 90 tablet 0   fluticasone (FLONASE) 50 MCG/ACT nasal spray PLACE 2 SPRAYS INTO THE NOSE DAILY. (Patient taking differently: 2 sprays. PLACE 2 SPRAYS  INTO THE NOSE TWICE.) 48 mL 2   hydrochlorothiazide (HYDRODIURIL) 25 MG tablet TAKE 1 TABLET BY MOUTH EVERY DAY 90 tablet 1   loratadine (CLARITIN) 10 MG tablet Take 10 mg by mouth daily as needed.     omeprazole (PRILOSEC) 20 MG capsule TAKE ONE CAPSULE BY MOUTH ONCE A DAY BEFORE A MEAL 90 capsule 0   rosuvastatin (CRESTOR) 10 MG tablet TAKE 1 TABLET BY MOUTH EVERY DAY 90 tablet 3   timolol (TIMOPTIC) 0.5 % ophthalmic solution Place 1 drop into both eyes 2 (two) times daily.      ondansetron (ZOFRAN ODT) 4 MG disintegrating tablet Take 1 tablet (4 mg total) by mouth every 8 (eight) hours as needed for nausea or vomiting. (Patient not taking: Reported on 03/21/2021) 20 tablet 0   No current facility-administered medications on file prior to visit.     Review of Systems As in subjective    Objective:   Physical Exam BP 120/70   Pulse 67   Temp 97.9 F (36.6 C)   Wt 151 lb 9.6 oz (68.8 kg)   BMI 29.61 kg/m   Wt Readings from Last 3 Encounters:  03/21/21 151 lb 9.6 oz (68.8  kg)  03/16/21 150 lb (68 kg)  02/14/21 149 lb 1 oz (67.6 kg)    General appearence: alert, no distress, WD/WN HEENT: normocephalic, sclerae anicteric, impacted cerumen right ear, otherwise left TM flat but no impacted cerumen, nares patent, no discharge , +mild erythema, pharynx normal Oral cavity: MMM, no lesions Neck: supple, no lymphadenopathy, no thyromegaly, no masses Heart: RRR, normal S1, S2, no murmurs Lungs: CTA bilaterally, no wheezes, rhonchi, or rales Abdomen: +bs, soft, non tender, non distended, no masses, no hepatomegaly, no splenomegaly Pulses: 2+ symmetric, upper and lower extremities, normal cap refill       Assessment:     Encounter Diagnoses  Name Primary?   Sinus pressure Yes   Dizziness    Bloating    Impacted cerumen of right ear        Plan:     We discussed symptoms and concerns. Finish out amoxicillin, add prednisone medication as below.  Continue good hydration,  over-the-counter supportive therapy as discussed, nasal saline. Can use OTC Claritin.  Use caution with Zofran since you felt sleepy on this.  If not much improved in the next 3 to 4 days then call back or recheck.  Impacted cerumen -  Discussed findings.  Discussed risk/benefits of procedure and patient agrees to procedure. Successfully used warm water lavage to remove impacted cerumen from right ear canal. Patient tolerated procedure well. Advised they avoid using any cotton swabs or other devices to clean the ear canals.  Use basic hygiene as discussed.  Follow up prn.   Bloating, constipation - work on good hydration, begin trial of miralax daily for a day or 2.  Samples of miralax given.  Normally she doesn't have this issue   Deborah Gardner was seen today for dizziness.  Diagnoses and all orders for this visit:  Sinus pressure  Dizziness  Bloating  Impacted cerumen of right ear  Other orders -     predniSONE (DELTASONE) 10 MG tablet; 6 tablets all together day 1, 5 tablets day 2, 4 tablets day 3, 3 tablets day 4, 2 tablets day 5, 1 tablet day 6.  F/u prn or if not much improved within 5-7 days, then recheck

## 2021-03-29 ENCOUNTER — Other Ambulatory Visit: Payer: Self-pay | Admitting: Family Medicine

## 2021-03-30 ENCOUNTER — Other Ambulatory Visit: Payer: Self-pay | Admitting: Family Medicine

## 2021-04-11 DIAGNOSIS — H40059 Ocular hypertension, unspecified eye: Secondary | ICD-10-CM | POA: Diagnosis not present

## 2021-04-11 DIAGNOSIS — J302 Other seasonal allergic rhinitis: Secondary | ICD-10-CM | POA: Diagnosis not present

## 2021-04-11 DIAGNOSIS — I10 Essential (primary) hypertension: Secondary | ICD-10-CM | POA: Diagnosis not present

## 2021-04-11 DIAGNOSIS — E785 Hyperlipidemia, unspecified: Secondary | ICD-10-CM | POA: Diagnosis not present

## 2021-04-11 DIAGNOSIS — Z809 Family history of malignant neoplasm, unspecified: Secondary | ICD-10-CM | POA: Diagnosis not present

## 2021-04-11 DIAGNOSIS — M810 Age-related osteoporosis without current pathological fracture: Secondary | ICD-10-CM | POA: Diagnosis not present

## 2021-04-11 DIAGNOSIS — K219 Gastro-esophageal reflux disease without esophagitis: Secondary | ICD-10-CM | POA: Diagnosis not present

## 2021-04-11 DIAGNOSIS — F419 Anxiety disorder, unspecified: Secondary | ICD-10-CM | POA: Diagnosis not present

## 2021-04-11 DIAGNOSIS — R32 Unspecified urinary incontinence: Secondary | ICD-10-CM | POA: Diagnosis not present

## 2021-04-15 ENCOUNTER — Other Ambulatory Visit: Payer: Self-pay | Admitting: Family Medicine

## 2021-04-20 ENCOUNTER — Other Ambulatory Visit: Payer: Self-pay | Admitting: Family Medicine

## 2021-04-25 DIAGNOSIS — H40053 Ocular hypertension, bilateral: Secondary | ICD-10-CM | POA: Diagnosis not present

## 2021-04-25 DIAGNOSIS — H35371 Puckering of macula, right eye: Secondary | ICD-10-CM | POA: Diagnosis not present

## 2021-04-25 DIAGNOSIS — I10 Essential (primary) hypertension: Secondary | ICD-10-CM | POA: Diagnosis not present

## 2021-05-11 DIAGNOSIS — M9902 Segmental and somatic dysfunction of thoracic region: Secondary | ICD-10-CM | POA: Diagnosis not present

## 2021-05-11 DIAGNOSIS — M5134 Other intervertebral disc degeneration, thoracic region: Secondary | ICD-10-CM | POA: Diagnosis not present

## 2021-05-11 DIAGNOSIS — M5136 Other intervertebral disc degeneration, lumbar region: Secondary | ICD-10-CM | POA: Diagnosis not present

## 2021-05-11 DIAGNOSIS — M9903 Segmental and somatic dysfunction of lumbar region: Secondary | ICD-10-CM | POA: Diagnosis not present

## 2021-07-06 DIAGNOSIS — S83241D Other tear of medial meniscus, current injury, right knee, subsequent encounter: Secondary | ICD-10-CM | POA: Diagnosis not present

## 2021-07-08 DIAGNOSIS — R200 Anesthesia of skin: Secondary | ICD-10-CM | POA: Diagnosis not present

## 2021-07-08 DIAGNOSIS — M19041 Primary osteoarthritis, right hand: Secondary | ICD-10-CM | POA: Diagnosis not present

## 2021-07-08 DIAGNOSIS — M19042 Primary osteoarthritis, left hand: Secondary | ICD-10-CM | POA: Diagnosis not present

## 2021-07-08 DIAGNOSIS — M180 Bilateral primary osteoarthritis of first carpometacarpal joints: Secondary | ICD-10-CM | POA: Diagnosis not present

## 2021-07-20 DIAGNOSIS — G5613 Other lesions of median nerve, bilateral upper limbs: Secondary | ICD-10-CM | POA: Diagnosis not present

## 2021-07-27 DIAGNOSIS — R200 Anesthesia of skin: Secondary | ICD-10-CM | POA: Diagnosis not present

## 2021-07-28 ENCOUNTER — Other Ambulatory Visit: Payer: Self-pay | Admitting: Family Medicine

## 2021-08-03 DIAGNOSIS — M25561 Pain in right knee: Secondary | ICD-10-CM | POA: Diagnosis not present

## 2021-08-17 ENCOUNTER — Ambulatory Visit (INDEPENDENT_AMBULATORY_CARE_PROVIDER_SITE_OTHER): Payer: Medicare PPO | Admitting: Medical

## 2021-08-17 ENCOUNTER — Other Ambulatory Visit (INDEPENDENT_AMBULATORY_CARE_PROVIDER_SITE_OTHER): Payer: Medicare PPO

## 2021-08-17 VITALS — Wt 152.0 lb

## 2021-08-17 DIAGNOSIS — R6883 Chills (without fever): Secondary | ICD-10-CM | POA: Diagnosis not present

## 2021-08-17 DIAGNOSIS — R051 Acute cough: Secondary | ICD-10-CM | POA: Diagnosis not present

## 2021-08-17 DIAGNOSIS — J988 Other specified respiratory disorders: Secondary | ICD-10-CM

## 2021-08-17 DIAGNOSIS — R059 Cough, unspecified: Secondary | ICD-10-CM | POA: Diagnosis not present

## 2021-08-17 LAB — POCT INFLUENZA A/B
Influenza A, POC: NEGATIVE
Influenza B, POC: NEGATIVE

## 2021-08-17 LAB — POC COVID19 BINAXNOW: SARS Coronavirus 2 Ag: POSITIVE — AB

## 2021-08-17 MED ORDER — MOLNUPIRAVIR EUA 200MG CAPSULE: 4.0000 | ORAL_CAPSULE | Freq: Two times a day (BID) | ORAL | 0 refills | Status: AC

## 2021-08-17 MED ORDER — BENZONATATE 100 MG PO CAPS: 100.0000 mg | ORAL_CAPSULE | Freq: Two times a day (BID) | ORAL | 0 refills | Status: DC | PRN

## 2021-08-17 NOTE — Progress Notes (Signed)
Subjective:     Patient ID: Deborah Gardner, female   DOB: 1946/03/31, 76 y.o.   MRN: [id number]  This visit type was conducted due to national recommendations for restrictions regarding the COVID-19 Pandemic (e.g. social distancing) in an effort to limit this patient's exposure and mitigate transmission in our community.  Due to their co-morbid illnesses, this patient is at least at moderate risk for complications without adequate follow up.  This format is felt to be most appropriate for this patient at this time.    Documentation for virtual audio and video telecommunications through [facility] encounter:  The patient was located at home. The provider was located in the office. The patient did consent to this visit and is aware of possible charges through their insurance for this visit.  The other persons participating in this telemedicine service were none. Time spent on call was 20 minutes and in review of previous records 20 minutes total.  This virtual service is not related to other E/M service within previous 7 days.   HPI Chief Complaint  Patient presents with   flu/ covid    Possible flu/covid- symptoms- chills, diarrhea, sore throat, HA off and on and achy   Virtual consult for illness.  Started with tickle in throat.  Has had some cough, intermittent, not bad.   But now worsening, sore throat, feels terrible, had some diarrhea yesterday, headache, chills.  No fever, some nausea but no vomiting.   Symptoms began 4 days or so with some congestion, but 2 days ago worse symptoms.   No sinus pressure. Ears with some pressure.   Getting some mucous blowing nose.  No wheezing or sob.    No sick contacts.  Using tylenol, coricidin for multi symptoms.   Water intake is good.    No other aggravating or relieving factors. No other complaint.   Past Medical History:  Diagnosis Date   Allergic rhinitis    Allergy    Arthritis    Cataract    bil   Colon polyps    Esophagitis     GERD (gastroesophageal reflux disease)    Glaucoma    HH (hiatus hernia)    HTN (hypertension)    Hyperlipidemia    Increased pressure in the eye    OA (osteoarthritis)    hands   Osteoporosis    Rectal pain    Current Outpatient Medications on File Prior to Visit  Medication Sig Dispense Refill   Calcium Carb-Cholecalciferol (CALCIUM 600 + D PO) Take 1 tablet by mouth daily.     celecoxib (CELEBREX) 200 MG capsule Take by mouth.     Cholecalciferol (VITAMIN D3) 25 MCG (1000 UT) CAPS Take 1 capsule by mouth in the morning and at bedtime.     escitalopram (LEXAPRO) 10 MG tablet TAKE 1 TABLET BY MOUTH EVERY DAY 90 tablet 2   fluticasone (FLONASE) 50 MCG/ACT nasal spray SPRAY 2 SPRAYS INTO EACH NOSTRIL EVERY DAY AS NEEDED 48 mL 1   hydrochlorothiazide (HYDRODIURIL) 25 MG tablet TAKE 1 TABLET BY MOUTH EVERY DAY 90 tablet 2   omeprazole (PRILOSEC) 20 MG capsule TAKE ONE CAPSULE BY MOUTH ONCE A DAY BEFORE A MEAL 90 capsule 2   rosuvastatin (CRESTOR) 10 MG tablet TAKE 1 TABLET BY MOUTH EVERY DAY 90 tablet 3   timolol (TIMOPTIC) 0.5 % ophthalmic solution Place 1 drop into both eyes 2 (two) times daily.      ondansetron (ZOFRAN ODT) 4 MG disintegrating tablet Take 1 tablet (  4 mg total) by mouth every 8 (eight) hours as needed for nausea or vomiting. (Patient not taking: Reported on 03/21/2021) 20 tablet 0   No current facility-administered medications on file prior to visit.    Review of Systems As in subjective    Objective:   Physical Exam Due to coronavirus pandemic stay at home measures, patient visit was virtual and they were not examined in person.   General: Ill-appearing but no labored breathing or witnessed wheezing     Assessment:     Encounter Diagnoses  Name Primary?   Respiratory tract infection Yes   Chills    Acute cough        Plan:     We discussed symptoms and concerns.  She had some initial symptoms 4 to 5 days ago that were mild congestion but then had an  abrupt change in symptoms 2 days ago.  Advise rest, hydration, can continue the over-the-counter Coricidin multi symptom medication she is using so far.  We discussed limitations of virtual consult  She will report to our office in the back parking lot for flu and COVID testing.  If negative I will bring her in and examine her and try to get more information before making decision on treatment otherwise   Addendum: She tested positive for covid today.   Patient Instructions  Your coronavirus/Covid 19 test result was indeed positive!    General recommendations: I recommend you rest, hydrate well with water and clear fluids throughout the day.   You can use Tylenol for pain or fever You can use over the counter Delsym for cough, or you can use Mucinex DM if worse cough and mucus You can use over the counter Emetrol for nausea.     Begin the antiviral molnupiravir for 5 days.  This is a specific medicine for COVID infection meant to reduce the overall risk of severe illness or hospitalization.  I also sent prescription Tessalon Perle cough drops you can use instead of Delsym if desired.   If you are having trouble breathing, if you are very weak, have high fever 103 or higher consistently despite Tylenol, or uncontrollable nausea and vomiting, then call or go to the emergency department.    If you have other questions or have other symptoms or questions you are concerned about then please make a virtual visit  Covid symptoms such as fatigue and cough can linger over 2 weeks, even after the initial fever, aches, chills, and other initial symptoms.   Self Quarantine: The CDC, Centers for Disease Control has recommended a self quarantine of 5 days from the start of your illness until you are symptom-free including at least 24 hours of no symptoms including no fever, no shortness of breath, and no body aches and chills, by day 5 before returning to work or general contact with the  public.  What does self quarantine mean: avoiding contact with people as much as possible.   Particularly in your house, isolate your self from others in a separate room, wear a mask when possible in the room, particularly if coughing a lot.   Have others bring food, water, medications, etc., to your door, but avoid direct contact with your household contacts during this time to avoid spreading the infection to them.   If you have a separate bathroom and living quarters during the next 2 weeks away from others, that would be preferable.    If you can't completely isolate, then wear a mask, wash  hands frequently with soap and water for at least 15 seconds, minimize close contact with others, and have a friend or family member check regularly from a distance to make sure you are not getting seriously worse.     You should not be going out in public, should not be going to stores, to work or other public places until all your symptoms have resolved and at least 5 days + 24 hours of no symptoms at all have transpired.   Ideally you should avoid contact with others for a full 5 days if possible.  One of the goals is to limit spread to high risk people; people that are older and elderly, people with multiple health issues like diabetes, heart disease, lung disease, and anybody that has weakened immune systems such as people with cancer or on immunosuppressive therapy.       Shantera was seen today for flu/ covid.  Diagnoses and all orders for this visit:  Respiratory tract infection  Chills  Acute cough  F/u in our back parking lot for testing today

## 2021-08-17 NOTE — Patient Instructions (Signed)
Your coronavirus/Covid 19 test result was indeed positive!    General recommendations: I recommend you rest, hydrate well with water and clear fluids throughout the day.   You can use Tylenol for pain or fever You can use over the counter Delsym for cough, or you can use Mucinex DM if worse cough and mucus You can use over the counter Emetrol for nausea.     Begin the antiviral molnupiravir for 5 days.  This is a specific medicine for COVID infection meant to reduce the overall risk of severe illness or hospitalization.  I also sent prescription Tessalon Perle cough drops you can use instead of Delsym if desired.   If you are having trouble breathing, if you are very weak, have high fever 103 or higher consistently despite Tylenol, or uncontrollable nausea and vomiting, then call or go to the emergency department.    If you have other questions or have other symptoms or questions you are concerned about then please make a virtual visit  Covid symptoms such as fatigue and cough can linger over 2 weeks, even after the initial fever, aches, chills, and other initial symptoms.   Self Quarantine: The CDC, Centers for Disease Control has recommended a self quarantine of 5 days from the start of your illness until you are symptom-free including at least 24 hours of no symptoms including no fever, no shortness of breath, and no body aches and chills, by day 5 before returning to work or general contact with the public.  What does self quarantine mean: avoiding contact with people as much as possible.   Particularly in your house, isolate your self from others in a separate room, wear a mask when possible in the room, particularly if coughing a lot.   Have others bring food, water, medications, etc., to your door, but avoid direct contact with your household contacts during this time to avoid spreading the infection to them.   If you have a separate bathroom and living quarters during the next 2 weeks  away from others, that would be preferable.    If you can't completely isolate, then wear a mask, wash hands frequently with soap and water for at least 15 seconds, minimize close contact with others, and have a friend or family member check regularly from a distance to make sure you are not getting seriously worse.     You should not be going out in public, should not be going to stores, to work or other public places until all your symptoms have resolved and at least 5 days + 24 hours of no symptoms at all have transpired.   Ideally you should avoid contact with others for a full 5 days if possible.  One of the goals is to limit spread to high risk people; people that are older and elderly, people with multiple health issues like diabetes, heart disease, lung disease, and anybody that has weakened immune systems such as people with cancer or on immunosuppressive therapy.

## 2021-08-22 ENCOUNTER — Other Ambulatory Visit: Payer: Self-pay | Admitting: Medical

## 2021-08-22 ENCOUNTER — Telehealth: Payer: Self-pay

## 2021-08-22 MED ORDER — PROMETHAZINE HCL 12.5 MG PO TABS: 12.5000 mg | ORAL_TABLET | Freq: Four times a day (QID) | ORAL | 0 refills | Status: DC | PRN

## 2021-08-22 NOTE — Telephone Encounter (Signed)
Called Daughter Juliann Pulse & states she didn't give patient antiviral last night and she is feeling some better today not as nauseous.   She has ate a pop-tart and kept that down this morning.  Stomach isn't hurting as bad and feeling some better.  Really thinks the antiviral is making her very sick so would like to stop taking that if that is ok, she has 4 pills left.   Pt prefers pills instead of suppository but may not need if stops the antiviral and IF the bad nausea and pain doesn't come back this evening (seems to get worse at night). Vitals today BP 153/86 (high for her but hadn't taken BP med yet today) Temp 97.6 Pulse 56 Weight 149.5   Please advise

## 2021-08-22 NOTE — Telephone Encounter (Signed)
Daughter Kathy informed.

## 2021-08-22 NOTE — Telephone Encounter (Signed)
Daughter advised that patient having dry heaves bad, not vomiting but Zofran not helping.  Didn't know if nausea coming from antiviral medication, has 4 pills left.  She has a hiatal hernia and that has flared up also.  Daughter has been staying with her the last 2 days because she was not doing well at all.  Daughter is making her drink some fluids so she doesn't get dehydrated. No shortness of breath or trouble breathing.  Can you prescribe her phenergan to help with nausea and do you think she should continue taking the antiviral?

## 2021-08-25 ENCOUNTER — Encounter: Payer: Self-pay | Admitting: Family Medicine

## 2021-08-25 ENCOUNTER — Telehealth: Payer: Self-pay

## 2021-08-25 ENCOUNTER — Ambulatory Visit (INDEPENDENT_AMBULATORY_CARE_PROVIDER_SITE_OTHER): Payer: Medicare PPO | Admitting: Family Medicine

## 2021-08-25 ENCOUNTER — Other Ambulatory Visit: Payer: Self-pay

## 2021-08-25 VITALS — BP 128/72 | HR 64 | Temp 98.0°F | Ht 60.0 in | Wt 145.8 lb

## 2021-08-25 DIAGNOSIS — K5900 Constipation, unspecified: Secondary | ICD-10-CM | POA: Diagnosis not present

## 2021-08-25 DIAGNOSIS — K219 Gastro-esophageal reflux disease without esophagitis: Secondary | ICD-10-CM | POA: Diagnosis not present

## 2021-08-25 DIAGNOSIS — U071 COVID-19: Secondary | ICD-10-CM

## 2021-08-25 DIAGNOSIS — R110 Nausea: Secondary | ICD-10-CM

## 2021-08-25 DIAGNOSIS — R109 Unspecified abdominal pain: Secondary | ICD-10-CM

## 2021-08-25 NOTE — Patient Instructions (Addendum)
Increase your omeprazole to 40mg --since you've already been taking 1 twice daily, for the next few days, increase to 2 pills (40mg ) twice daily. Once your stomach is feeling better, you can back down to 1 pill twice daily, and eventually back to the 20mg  once a day, as you previously took.  If, despite the higher omeprazole dose, you still have a lot of nausea, you can use either the zofran (ondansetron) or the phenergan. It is important to continue to stay well hydrated, using medications to help with nausea, if needed.   Restart the lexapro, because sometimes stopping that suddenly can make you feel sick.

## 2021-08-25 NOTE — Progress Notes (Addendum)
Chief Complaint  Patient presents with   Fatigue    Positive covid 08/17/21. Did not take all of the antiviral. Feels like she wants to vomit, but never does-dry heaves. Some abdominal pain. Wonder if her hernia is aggravated. Has only taken mucinex and omeprazole today. They think she might be dehydrated.   Patient tested postiive for COVID 2/8. She didn't tolerate the molnupiravir--stated it caused abdominal pain, nausea and rectal pain.  She stopped taking it.  Thinks it aggravated her hiatal hernia.  She scheduled appt because she still feels weak and just not feeling good, not able to eat a lot. She feels like she is doing a good job staying hydrated, stating that her urine was completely clear (not yellow).   She is having nausea, epigastric pain, a lot of reflux, "gas coming into throat". Taking prilosec OTC 20mg , sometimes has taken it twice daily (only once so far today). She took zofran Fri, Sat, Sun Monday Shane called in phenergan, never took it. She states that nausea had eased off with the zofran by the time she got the phenergan called in.  Urine was clear, not yellow. Denies dysuria. She reports some constipation, since not eating much. Yesterday ate eggo waffle, jello.  Beet chips and rice last night. Last BM yesterday, hard, straining. Taking stool softeners at night.  PMH, PSH, SH reviewed  Outpatient Encounter Medications as of 08/25/2021  Medication Sig Note   guaiFENesin (MUCINEX) 600 MG 12 hr tablet Take by mouth 2 (two) times daily.    omeprazole (PRILOSEC) 20 MG capsule TAKE ONE CAPSULE BY MOUTH ONCE A DAY BEFORE A MEAL    benzonatate (TESSALON) 100 MG capsule Take 1 capsule (100 mg total) by mouth 2 (two) times daily as needed for cough. (Patient not taking: Reported on 08/25/2021) 08/25/2021: Never took   Calcium Carb-Cholecalciferol (CALCIUM 600 + D PO) Take 1 tablet by mouth daily. (Patient not taking: Reported on 08/25/2021)    celecoxib (CELEBREX) 200 MG capsule  Take by mouth. (Patient not taking: Reported on 08/25/2021)    Cholecalciferol (VITAMIN D3) 25 MCG (1000 UT) CAPS Take 1 capsule by mouth in the morning and at bedtime. (Patient not taking: Reported on 08/25/2021)    escitalopram (LEXAPRO) 10 MG tablet TAKE 1 TABLET BY MOUTH EVERY DAY (Patient not taking: Reported on 08/25/2021)    fluticasone (FLONASE) 50 MCG/ACT nasal spray SPRAY 2 SPRAYS INTO EACH NOSTRIL EVERY DAY AS NEEDED (Patient not taking: Reported on 08/25/2021)    hydrochlorothiazide (HYDRODIURIL) 25 MG tablet TAKE 1 TABLET BY MOUTH EVERY DAY (Patient not taking: Reported on 08/25/2021)    ondansetron (ZOFRAN ODT) 4 MG disintegrating tablet Take 1 tablet (4 mg total) by mouth every 8 (eight) hours as needed for nausea or vomiting. (Patient not taking: Reported on 03/21/2021)    promethazine (PHENERGAN) 12.5 MG tablet Take 1 tablet (12.5 mg total) by mouth every 6 (six) hours as needed for nausea or vomiting. (Patient not taking: Reported on 08/25/2021)    rosuvastatin (CRESTOR) 10 MG tablet TAKE 1 TABLET BY MOUTH EVERY DAY (Patient not taking: Reported on 08/25/2021)    timolol (TIMOPTIC) 0.5 % ophthalmic solution Place 1 drop into both eyes 2 (two) times daily.  (Patient not taking: Reported on 08/25/2021)    No facility-administered encounter medications on file as of 08/25/2021.    Allergies  Allergen Reactions   Atorvastatin Other (See Comments)    Memory loss   Lotrel [Amlodipine Besy-Benazepril Hcl] Hives  Most likely from the benazepril    Sulfa Antibiotics     Lip swelling   Amlodipine Besylate Rash   ROS: No fever or chills, but reports getting hot/cold No chest pain, shortness of breath. Not coughing much. No rashes No dysuria.  +Constipation Nausea, reflux and epigastric discomfort per HPI   PHYSICAL EXAM: BP 128/72    Pulse 64    Temp 98 F (36.7 C) (Tympanic)    Ht 5' (1.524 m)    Wt 145 lb 12.8 oz (66.1 kg)    SpO2 98%    BMI 28.47 kg/m   Elderly female, appears  to not feel well, nauseated and slightly weak. She is accompanied by her daughter (who also has COVID and is supposed to be in isolation). She is alert and oriented. HEENT: conjunctiva and sclera are clear.  Wearing mask (asked to wear N-95; had surgical mask off her face when nurse first walked in) Neck: no lymphadenopathy or mass Heart: regular rate and rhythm Lungs: clear bilaterally Abdomen: soft, normal bowel sounds.  No organomegaly or mass.  Very slight epigastric tenderness. No rebound or guarding Extremities: no edema Neuro: normal gait, grossly normal strength. Not tremulous. Alert and oriented.  ASSESSMENT/PLAN:  COVID-19 virus infection  Abdominal pain, unspecified abdominal location - Plan: POCT Urinalysis DIP (Proadvantage Device)  Gastroesophageal reflux disease, unspecified whether esophagitis present - pt to increase her omeprazole to 40mg  BID for 1-2d, then likely can decrease to 40mg  qd, and back to 20mg  daily when better. Proper diet reviewed  Nausea - related to COVID, flare of GERD/HH. Increase PPI dose as directed. Use zofran prn.  Has phenergan, more sedating, use with caution  Constipation, unspecified constipation type - encouraged increased fluid intake, high fiber diet.  Should correct itself when eating better  Discussed  normal findings on exam (did not have her remove her mask to assess mucus membranes).  I suspect some mild dehydration, since she wasn't able to give [place] a urine sample (despite reporting that her urine was completely clear, not yellow) earlier.  Offered, labs, but patient declined.  Discussed importance of using the medications to help with nausea so that she can stay well hydrated.  If worsening dehydration, will need to go to ER for IV fluids.  Discussed that it is possible that some of her GI complaints could be related to abrupt cessation of her SSRI, and she should resume her lexapro.  Reminded of recs re: isolation (daughter present with  her is unvaccinated and supposed to be in isolation)  I spent 35 minutes dedicated to the care of this patient, including pre-visit review of records, face to face time, post-visit ordering of testing and documentation.

## 2021-08-25 NOTE — Telephone Encounter (Signed)
Appt made

## 2021-08-25 NOTE — Telephone Encounter (Signed)
Pt called and said still weak and just not feeling good, not able to eat a lot, is trying to keep hydrated, constant nausea and issues with hiatal hernia.  She just thinks she should be feeling better by now.  Doesn't sleep well at night, so weak she trembles.  Has slight cough, nothing bad.  Do you think she needs to be seen? She wants to know what you think? Or your recommendations.

## 2021-09-27 ENCOUNTER — Encounter: Payer: Self-pay | Admitting: Medical

## 2021-09-27 ENCOUNTER — Ambulatory Visit: Payer: Medicare PPO | Admitting: Medical

## 2021-09-27 VITALS — BP 124/72 | HR 59 | Ht 61.0 in | Wt 153.0 lb

## 2021-09-27 DIAGNOSIS — J301 Allergic rhinitis due to pollen: Secondary | ICD-10-CM

## 2021-09-27 DIAGNOSIS — M810 Age-related osteoporosis without current pathological fracture: Secondary | ICD-10-CM

## 2021-09-27 DIAGNOSIS — E2839 Other primary ovarian failure: Secondary | ICD-10-CM

## 2021-09-27 DIAGNOSIS — E0590 Thyrotoxicosis, unspecified without thyrotoxic crisis or storm: Secondary | ICD-10-CM

## 2021-09-27 DIAGNOSIS — I10 Essential (primary) hypertension: Secondary | ICD-10-CM

## 2021-09-27 DIAGNOSIS — K2100 Gastro-esophageal reflux disease with esophagitis, without bleeding: Secondary | ICD-10-CM

## 2021-09-27 DIAGNOSIS — M1990 Unspecified osteoarthritis, unspecified site: Secondary | ICD-10-CM | POA: Diagnosis not present

## 2021-09-27 DIAGNOSIS — Z780 Asymptomatic menopausal state: Secondary | ICD-10-CM | POA: Diagnosis not present

## 2021-09-27 DIAGNOSIS — Z7185 Encounter for immunization safety counseling: Secondary | ICD-10-CM

## 2021-09-27 DIAGNOSIS — Z1231 Encounter for screening mammogram for malignant neoplasm of breast: Secondary | ICD-10-CM

## 2021-09-27 DIAGNOSIS — E7800 Pure hypercholesterolemia, unspecified: Secondary | ICD-10-CM | POA: Diagnosis not present

## 2021-09-27 MED ORDER — LORATADINE 10 MG PO TABS: 10.0000 mg | ORAL_TABLET | Freq: Every day | ORAL | 3 refills | Status: DC

## 2021-09-27 NOTE — Patient Instructions (Addendum)
Please call to schedule your mammogram and bone density test ? ?[facility]  ?[id number] ?[address], [address] ?[place], [place] [address] ? ? ? ?Osteoporosis ?Osteoporosis is when the bones get thin and weak. This can cause your bones to break (fracture) more easily. ?What are the causes? ?The exact cause of this condition is not known. ?What increases the risk? ?Having family members with this condition. ?Not eating enough healthy foods. ?Taking certain medicines. ?Being female. ?Being age 78 or older. ?Smoking or using other products that contain nicotine or tobacco, such as e-cigarettes or chewing tobacco. ?Not exercising. ?Being of European or Asian ancestry. ?Having a small body frame. ?What are the signs or symptoms? ?A broken bone might be the first sign, especially if the break results from a fall or injury that usually would not cause a bone to break. ?Other signs and symptoms include: ?Pain in the neck or low back. ?Being hunched over (stooped posture). ?Getting shorter. ?How is this treated? ?Eating more foods with more calcium and vitamin D in them. ?Doing exercises. ?Stopping tobacco use. ?Limiting how much alcohol you drink. ?Taking medicines to slow bone loss or help make the bones stronger. ?Taking supplements of calcium and vitamin D every day. ?Taking medicines to replace chemicals in the body (hormone replacement medicines). ?Monitoring your levels of calcium and vitamin D. ?The goal of treatment is to strengthen your bones and lower your risk for a bone break. ?Follow these instructions at home: ?Eating and drinking ?Eat plenty of calcium and vitamin D. These nutrients are good for your bones. Good sources of calcium and vitamin D include: ?Some fish, such as salmon and tuna. ?Foods that have calcium and vitamin D added to them (fortified foods), such as some breakfast cereals. ?Egg yolks. ?Cheese. ?Liver. ? ?Activity ?Do exercises as told by your doctor. Ask  your doctor what exercises are safe for you. You should do: ?Exercises that make your muscles work to hold your body weight up (weight-bearing exercises). These include tai chi, yoga, and walking. ?Exercises to make your muscles stronger. One example is lifting weights. ?Lifestyle ?Do not drink alcohol if: ?Your doctor tells you not to drink. ?You are pregnant, may be pregnant, or are planning to become pregnant. ?If you drink alcohol: ?Limit how much you use to: ?0-1 drink a day for women. ?0-2 drinks a day for men. ?Know how much alcohol is in your drink. In the [place] one drink equals one 12 oz bottle of beer (355 mL), one 5 oz glass of wine (148 mL), or one 1? oz glass of hard liquor (44 mL). ?Do not smoke or use any products that contain nicotine or tobacco. If you need help quitting, ask your doctor. ?Preventing falls ?Use tools to help you move around (mobility aids) as needed. These include canes, walkers, scooters, and crutches. ?Keep rooms well-lit. ?Put away things on the floor that could make you trip. These include cords and rugs. ?Install safety rails on stairs. Install grab bars in bathrooms. ?Use rubber mats in slippery areas, like bathrooms. ?Wear shoes that: ?Fit you well. ?Support your feet. ?Have closed toes. ?Have rubber soles or low heels. ?Tell your doctor about all of the medicines you are taking. Some medicines can make you more likely to fall. ?General instructions ?Take over-the-counter and prescription medicines only as told by your doctor. ?Keep all follow-up visits. ?Contact a doctor if: ?You have not been tested (screened) for osteoporosis and you are: ?A  woman who is age 13 or older. ?A man who is age 66 or older. ?Get help right away if: ?You fall. ?You get hurt. ?Summary ?Osteoporosis happens when your bones get thin and weak. ?Weak bones can break (fracture) more easily. ?Eat plenty of calcium and vitamin D. These are good for your bones. ?Tell your doctor about all of the medicines  that you take. ?This information is not intended to replace advice given to you by your health care provider. Make sure you discuss any questions you have with your health care provider. ?Document Revised: 12/11/2019 Document Reviewed: 12/11/2019 ?Elsevier Patient Education ? 2022 [place]. ? ? ? ?Treatment options: ? ?Your bone density test shows osteoporosis from 2019, or low bone mass which puts you at risk for fractures if you fall.  A second result called the FRAX score that predicts your chance of a fracture within the next 10 years shows higher than average risk of a fracture.   ? ?Taking medications to treat osteoporosis reduces fracture risk by 50-70%!   1 in 65 American women and 1 in 83 men over age 44 years old have osteoporosis.   ? ?Recommendations:   ?Regular exercise including aerobic and weightbearing exercise, vitamin D supplementation, limiting alcohol and not smoking.   ? ?We generally recommend adding a medication at this point to reduce your risk of fracture by reducing the rate of bone turnover or helping to increase bone mass.  Examples would be Fosamax weekly oral medication, Prolia injection every 6 months, or other options.  ? ?I want you to consider the options below, then let me know what you may want to do.   All of the medications have potential risks, but they all have the benefit of slowing down the rate of bone turnover as we get older, helping to reduce the risk of a bone fracture. ? ?Options for therapy:  ? ?Bisphosphonate such as alendronate (Fosamax) ?This is a medication given weekly that strengthens bones by slowing down the rate of bone loss.  This is usually the first-line medication given the lower cost and does not require injection.  Many people tolerate this medication but some do not.  This medicine has to be taken weekly first thing in the morning on empty stomach, and you cannot lie down for an hour after taking the medication.  Risks of the medication includes  trouble swallowing the medication, inflammation of the esophagus, gastric ulcers, and rare risk of breakdown of the jawbone.  These medications are usually given for 5 years (3 years if injectable bisphosphonate like Reclast). ? ? ?Evenity  ?Compared with bisphosphonates, this type of medication called monoclonal antibodies produce similar or better bone density results and reduces the chance of all types of fractures. Evenity is delivered via a shot under the skin every month.   Recent research indicates there could be a high risk of spinal column fractures after stopping the drug.  A very rare complication of bisphosphonates and denosumab is a break or crack in the middle of the thighbone.  A second rare complication is delayed healing of the jawbone (osteonecrosis of the jaw). This can occur after an invasive dental procedure such as removing a tooth. ? ? ?Forteo ?This medication increases bone density and strength, it is a synthetic version of the parathyroid hormone, and it is given as a daily injection for 2 years.  Risk include leg cramps, nausea, dizziness, elevated calcium, joint pain, and rare risk of bone cancer in  animal studies. ? ?Prolia ?This is an injection given twice yearly that prevents bone dissolving osteoclast cells from forming.  Its a good option for women who can't tolerate bisphosphonate.  The advantages not having to take something every day or every month and it is well-tolerated for the most part.  This type of medication is called a monoclonal antibody, so there are risk of low blood calcium, skin infections, rash, and rarer potential risks are breakdown or death of jawbone, and rare risk of fracture of the thigh bone.  This medication is given ongoing. ? ?Calcitonin  ?This is an old drug that helps prevent bone loss, used as a daily nasal spray or injection.  It  reduces spinal fractures, but is not effective in other types of fractures so it is usually not the first-line option.  Side  effects can include flushing, rash.  There is a small increase in cancer risk with this medication. ? ?Evista ?This is a selective estrogen receptor modulator (SERM), and is used in breast cancer preve

## 2021-09-27 NOTE — Progress Notes (Signed)
Subjective: ? Deborah Gardner is a 76 y.o. female who presents for ?Chief Complaint  ?Patient presents with  ? med check  ?  Med check. Declines shingles shot ?  ?   ?Medical Team: ?Deborah Gardner, orthopedics/hand surgery ?Deborah Gardner, neurology ?Deborah Gardner, ENT ?Deborah Gardner, GI ?Dentist, Deborah Gardner ?Eye doctor, Deborah Gardner ?Deborah Gardner, [place] ?Prior PCP Dr. Loura Gardner ? ? ?Concerns: ?Wants to change PCP for convenience.   Its easier for her to get in and out for appt here, particularly for acute issues.   ? ?Has hx/o scoliosis, some back pains, and several joints with arthritis.   Sees ortho, and sees chiropractor every 3 months.  Recently was put on Celebrex and this works well for her.  Uses several days per week.  Was using multiple ibuprofen dosing daily prior to Celebrex. ? ?fell 07/2021 in her home, tripped with slippery socks ? ?Had covid recently and this was the worse illness she thinks she has ever had, took weeks to resolve.   Did horribly with molnapurnavir.     ? ?Has history of osteoporosis, last bone density 2019.  Not currently on medication for osteoporosis . Has hx/o scoliosis as well. ? ?HTN - compliant with medicaiton ? ?Hyperlipemia - compliant with medication. ? ?No other aggravating or relieving factors.   ? ?No other c/o. ? ?Past Medical History:  ?Diagnosis Date  ? Allergic rhinitis   ? Allergy   ? Arthritis   ? Cataract   ? bil  ? Colon polyps   ? Esophagitis   ? GERD (gastroesophageal reflux disease)   ? HH (hiatus hernia)   ? HTN (hypertension)   ? Hyperlipidemia   ? Increased pressure in the eye   ? OA (osteoarthritis)   ? hands  ? Ocular tension increased   ? at risk for glaucoma  ? Osteoporosis   ? Rectal pain   ? ?Current Outpatient Medications on File Prior to Visit  ?Medication Sig Dispense Refill  ? celecoxib (CELEBREX) 200 MG capsule Take by mouth. Uses prn    ? Cholecalciferol (VITAMIN D3) 25 MCG (1000 UT) CAPS Take 1 capsule by  mouth in the morning and at bedtime.    ? escitalopram (LEXAPRO) 10 MG tablet TAKE 1 TABLET BY MOUTH EVERY DAY 90 tablet 2  ? fluticasone (FLONASE) 50 MCG/ACT nasal spray SPRAY 2 SPRAYS INTO EACH NOSTRIL EVERY DAY AS NEEDED 48 mL 1  ? hydrochlorothiazide (HYDRODIURIL) 25 MG tablet TAKE 1 TABLET BY MOUTH EVERY DAY 90 tablet 2  ? omeprazole (PRILOSEC) 20 MG capsule TAKE ONE CAPSULE BY MOUTH ONCE A DAY BEFORE A MEAL 90 capsule 2  ? rosuvastatin (CRESTOR) 10 MG tablet TAKE 1 TABLET BY MOUTH EVERY DAY 90 tablet 3  ? timolol (TIMOPTIC) 0.5 % ophthalmic solution Place 1 drop into both eyes 2 (two) times daily.    ? guaiFENesin (MUCINEX) 600 MG 12 hr tablet Take by mouth 2 (two) times daily.    ? ?No current facility-administered medications on file prior to visit.  ? ? ? ?The following portions of the patient's history were reviewed and updated as appropriate: allergies, current medications, past family history, past medical history, past social history, past surgical history and problem list. ? ?ROS ?Otherwise as in subjective above ? ? ? ?Objective: ?BP 124/72   Pulse (!) 59   Ht '5\' 1"'$  (1.549 m)   Wt 153 lb (69.4 kg)   BMI 28.91 kg/m?  ? ?  General appearance: alert, no distress, well developed, well nourished ?Neck: supple, no lymphadenopathy, no thyromegaly, no masses ?Heart: RRR, normal S1, S2, no murmurs ?Lungs: CTA bilaterally, no wheezes, rhonchi, or rales ?Pulses: 2+ radial pulses, 2+ pedal pulses, normal cap refill ?Ext: no edema ? ? ? ? ?Assessment: ?Encounter Diagnoses  ?Name Primary?  ? Essential hypertension Yes  ? Pure hypercholesterolemia   ? Allergic rhinitis due to pollen, unspecified seasonality   ? Age related osteoporosis, unspecified pathological fracture presence   ? Osteoarthritis, unspecified osteoarthritis type, unspecified site   ? Subclinical hyperthyroidism   ? Gastroesophageal reflux disease with esophagitis, unspecified whether hemorrhage   ? Estrogen deficiency   ? Post-menopausal   ?  Osteoporosis, unspecified osteoporosis type, unspecified pathological fracture presence   ? Encounter for screening mammogram for malignant neoplasm of breast   ? Vaccine counseling   ? ? ? ?Plan: ?Hypertension - continue current medication, controlled ? ?Hyperlipidemia - continue statin.  Reviewed 12/2020 labs that were fine ? ?Allergic rhinism - script sent at her request as below ? ?Osteoporosis - discussed diagnosis, significance, possible complications.   She didn't seem to understand the difference between this, OA and scoliosis at first.  Advised updated bone density scan.   Spent time discussed medication options regarding osteoporosis, risks/benefits, discussed need for weight bearing and aerobic exercise.   She will consider options. ? ?OA, scoliosis - continue f/u with ortho, chiropractor, limit NSAID use in general ? ?Advised she call and schedule mammogram and bone density, orders placed. ? ?Vaccines: ?I recommend Shingrix and Tdap vaccines, updated Prevnar 20 vaccine.    ? ? ?Deborah Gardner was seen today for med check. ? ?Diagnoses and all orders for this visit: ? ?Essential hypertension ? ?Pure hypercholesterolemia ? ?Allergic rhinitis due to pollen, unspecified seasonality ? ?Age related osteoporosis, unspecified pathological fracture presence ? ?Osteoarthritis, unspecified osteoarthritis type, unspecified site ? ?Subclinical hyperthyroidism ? ?Gastroesophageal reflux disease with esophagitis, unspecified whether hemorrhage ? ?Estrogen deficiency ?-     DG Bone Density; Future ? ?Post-menopausal ?-     DG Bone Density; Future ? ?Osteoporosis, unspecified osteoporosis type, unspecified pathological fracture presence ?-     DG Bone Density; Future ? ?Encounter for screening mammogram for malignant neoplasm of breast ?-     MM DIGITAL SCREENING BILATERAL; Future ? ?Vaccine counseling ? ?Other orders ?-     loratadine (CLARITIN) 10 MG tablet; Take 1 tablet (10 mg total) by mouth daily. ? ? ? ?Follow up: summer  2023 for fasting med check. ?

## 2021-09-28 ENCOUNTER — Other Ambulatory Visit: Payer: Self-pay | Admitting: Medical

## 2021-09-28 DIAGNOSIS — Z780 Asymptomatic menopausal state: Secondary | ICD-10-CM

## 2021-09-28 DIAGNOSIS — Z1231 Encounter for screening mammogram for malignant neoplasm of breast: Secondary | ICD-10-CM

## 2021-09-28 DIAGNOSIS — M810 Age-related osteoporosis without current pathological fracture: Secondary | ICD-10-CM

## 2021-09-28 DIAGNOSIS — E2839 Other primary ovarian failure: Secondary | ICD-10-CM

## 2021-10-04 ENCOUNTER — Ambulatory Visit
Admission: RE | Admit: 2021-10-04 | Discharge: 2021-10-04 | Disposition: A | Discharge status: Home / Self Care | Payer: Medicare PPO | Source: Ambulatory Visit | Attending: Medical | Admitting: Medical

## 2021-10-04 DIAGNOSIS — Z1231 Encounter for screening mammogram for malignant neoplasm of breast: Secondary | ICD-10-CM | POA: Diagnosis not present

## 2021-11-09 DIAGNOSIS — M67442 Ganglion, left hand: Secondary | ICD-10-CM | POA: Diagnosis not present

## 2021-11-09 DIAGNOSIS — G5603 Carpal tunnel syndrome, bilateral upper limbs: Secondary | ICD-10-CM | POA: Diagnosis not present

## 2021-11-09 DIAGNOSIS — M65332 Trigger finger, left middle finger: Secondary | ICD-10-CM | POA: Diagnosis not present

## 2021-12-07 ENCOUNTER — Other Ambulatory Visit: Payer: Self-pay | Admitting: Family Medicine

## 2021-12-07 DIAGNOSIS — M9903 Segmental and somatic dysfunction of lumbar region: Secondary | ICD-10-CM | POA: Diagnosis not present

## 2021-12-07 DIAGNOSIS — M9904 Segmental and somatic dysfunction of sacral region: Secondary | ICD-10-CM | POA: Diagnosis not present

## 2021-12-07 DIAGNOSIS — M5136 Other intervertebral disc degeneration, lumbar region: Secondary | ICD-10-CM | POA: Diagnosis not present

## 2021-12-07 DIAGNOSIS — M545 Low back pain, unspecified: Secondary | ICD-10-CM | POA: Diagnosis not present

## 2021-12-09 ENCOUNTER — Telehealth: Payer: Self-pay

## 2021-12-09 ENCOUNTER — Other Ambulatory Visit: Payer: Self-pay

## 2021-12-09 MED ORDER — OMEPRAZOLE 20 MG PO CPDR: DELAYED_RELEASE_CAPSULE | ORAL | 0 refills | Status: DC

## 2021-12-09 NOTE — Telephone Encounter (Signed)
Done kh

## 2021-12-09 NOTE — Telephone Encounter (Signed)
Pt needs refill Omeprazole to [facility] [place], (she forgot to ask at her last appt)

## 2021-12-16 NOTE — Telephone Encounter (Signed)
done

## 2022-01-26 ENCOUNTER — Other Ambulatory Visit: Payer: Self-pay | Admitting: Family Medicine

## 2022-01-27 ENCOUNTER — Ambulatory Visit (INDEPENDENT_AMBULATORY_CARE_PROVIDER_SITE_OTHER): Payer: Medicare PPO

## 2022-01-27 VITALS — Ht 62.0 in | Wt 153.0 lb

## 2022-01-27 DIAGNOSIS — Z0000 Encounter for general adult medical examination without abnormal findings: Secondary | ICD-10-CM

## 2022-01-27 NOTE — Patient Instructions (Signed)
[name] , Thank you for taking time to come for your Medicare Wellness Visit. I appreciate your ongoing commitment to your health goals. Please review the following plan we discussed and let me know if I can assist you in the future.   Screening recommendations/referrals: Colonoscopy: completed 06/26/2018, due 06/27/2023 Mammogram: completed 10/04/2021, due 10/06/2022 Bone Density: scheduled for 03/15/2022 Recommended yearly ophthalmology/optometry visit for glaucoma screening and checkup Recommended yearly dental visit for hygiene and checkup  Vaccinations: Influenza vaccine: due 02/07/2022 Pneumococcal vaccine: completed 12/02/2014 Tdap vaccine: decline Shingles vaccine: decline   Covid-19: 10/13/2019, 09/22/2019  Advanced directives: Please bring a copy of your POA (Power of Attorney) and/or Living Will to your next appointment.   Conditions/risks identified: none  Next appointment: Follow up in one year for your annual wellness visit    Preventive Care 65 Years and Older, Female Preventive care refers to lifestyle choices and visits with your health care provider that can promote health and wellness. What does preventive care include? A yearly physical exam. This is also called an annual well check. Dental exams once or twice a year. Routine eye exams. Ask your health care provider how often you should have your eyes checked. Personal lifestyle choices, including: Daily care of your teeth and gums. Regular physical activity. Eating a healthy diet. Avoiding tobacco and drug use. Limiting alcohol use. Practicing safe sex. Taking low-dose aspirin every day. Taking vitamin and mineral supplements as recommended by your health care provider. What happens during an annual well check? The services and screenings done by your health care provider during your annual well check will depend on your age, overall health, lifestyle risk factors, and family history of disease. Counseling  Your  health care provider may ask you questions about your: Alcohol use. Tobacco use. Drug use. Emotional well-being. Home and relationship well-being. Sexual activity. Eating habits. History of falls. Memory and ability to understand (cognition). Work and work Statistician. Reproductive health. Screening  You may have the following tests or measurements: Height, weight, and BMI. Blood pressure. Lipid and cholesterol levels. These may be checked every 5 years, or more frequently if you are over 71 years old. Skin check. Lung cancer screening. You may have this screening every year starting at age 63 if you have a 30-pack-year history of smoking and currently smoke or have quit within the past 15 years. Fecal occult blood test (FOBT) of the stool. You may have this test every year starting at age 57. Flexible sigmoidoscopy or colonoscopy. You may have a sigmoidoscopy every 5 years or a colonoscopy every 10 years starting at age 42. Hepatitis C blood test. Hepatitis B blood test. Sexually transmitted disease (STD) testing. Diabetes screening. This is done by checking your blood sugar (glucose) after you have not eaten for a while (fasting). You may have this done every 1-3 years. Bone density scan. This is done to screen for osteoporosis. You may have this done starting at age 49. Mammogram. This may be done every 1-2 years. Talk to your health care provider about how often you should have regular mammograms. Talk with your health care provider about your test results, treatment options, and if necessary, the need for more tests. Vaccines  Your health care provider may recommend certain vaccines, such as: Influenza vaccine. This is recommended every year. Tetanus, diphtheria, and acellular pertussis (Tdap, Td) vaccine. You may need a Td booster every 10 years. Zoster vaccine. You may need this after age 46. Pneumococcal 13-valent conjugate (PCV13) vaccine. One  dose is recommended after age  21. Pneumococcal polysaccharide (PPSV23) vaccine. One dose is recommended after age 20. Talk to your health care provider about which screenings and vaccines you need and how often you need them. This information is not intended to replace advice given to you by your health care provider. Make sure you discuss any questions you have with your health care provider. Document Released: 07/23/2015 Document Revised: 03/15/2016 Document Reviewed: 04/27/2015 Elsevier Interactive Patient Education  2017 [place] Prevention in the Home Falls can cause injuries. They can happen to people of all ages. There are many things you can do to make your home safe and to help prevent falls. What can I do on the outside of my home? Regularly fix the edges of walkways and driveways and fix any cracks. Remove anything that might make you trip as you walk through a door, such as a raised step or threshold. Trim any bushes or trees on the path to your home. Use bright outdoor lighting. Clear any walking paths of anything that might make someone trip, such as rocks or tools. Regularly check to see if handrails are loose or broken. Make sure that both sides of any steps have handrails. Any raised decks and porches should have guardrails on the edges. Have any leaves, snow, or ice cleared regularly. Use sand or salt on walking paths during winter. Clean up any spills in your garage right away. This includes oil or grease spills. What can I do in the bathroom? Use night lights. Install grab bars by the toilet and in the tub and shower. Do not use towel bars as grab bars. Use non-skid mats or decals in the tub or shower. If you need to sit down in the shower, use a plastic, non-slip stool. Keep the floor dry. Clean up any water that spills on the floor as soon as it happens. Remove soap buildup in the tub or shower regularly. Attach bath mats securely with double-sided non-slip rug tape. Do not have throw  rugs and other things on the floor that can make you trip. What can I do in the bedroom? Use night lights. Make sure that you have a light by your bed that is easy to reach. Do not use any sheets or blankets that are too big for your bed. They should not hang down onto the floor. Have a firm chair that has side arms. You can use this for support while you get dressed. Do not have throw rugs and other things on the floor that can make you trip. What can I do in the kitchen? Clean up any spills right away. Avoid walking on wet floors. Keep items that you use a lot in easy-to-reach places. If you need to reach something above you, use a strong step stool that has a grab bar. Keep electrical cords out of the way. Do not use floor polish or wax that makes floors slippery. If you must use wax, use non-skid floor wax. Do not have throw rugs and other things on the floor that can make you trip. What can I do with my stairs? Do not leave any items on the stairs. Make sure that there are handrails on both sides of the stairs and use them. Fix handrails that are broken or loose. Make sure that handrails are as long as the stairways. Check any carpeting to make sure that it is firmly attached to the stairs. Fix any carpet that is loose or worn.  Avoid having throw rugs at the top or bottom of the stairs. If you do have throw rugs, attach them to the floor with carpet tape. Make sure that you have a light switch at the top of the stairs and the bottom of the stairs. If you do not have them, ask someone to add them for you. What else can I do to help prevent falls? Wear shoes that: Do not have high heels. Have rubber bottoms. Are comfortable and fit you well. Are closed at the toe. Do not wear sandals. If you use a stepladder: Make sure that it is fully opened. Do not climb a closed stepladder. Make sure that both sides of the stepladder are locked into place. Ask someone to hold it for you, if  possible. Clearly mark and make sure that you can see: Any grab bars or handrails. First and last steps. Where the edge of each step is. Use tools that help you move around (mobility aids) if they are needed. These include: Canes. Walkers. Scooters. Crutches. Turn on the lights when you go into a dark area. Replace any light bulbs as soon as they burn out. Set up your furniture so you have a clear path. Avoid moving your furniture around. If any of your floors are uneven, fix them. If there are any pets around you, be aware of where they are. Review your medicines with your doctor. Some medicines can make you feel dizzy. This can increase your chance of falling. Ask your doctor what other things that you can do to help prevent falls. This information is not intended to replace advice given to you by your health care provider. Make sure you discuss any questions you have with your health care provider. Document Released: 04/22/2009 Document Revised: 12/02/2015 Document Reviewed: 07/31/2014 Elsevier Interactive Patient Education  2017 [employer].

## 2022-01-27 NOTE — Progress Notes (Addendum)
I connected with Deborah Gardner today by telephone and verified that I am speaking with the correct person using two identifiers. Location patient: home Location provider: work Persons participating in the virtual visit: Deborah, Buzzell LPN.   I discussed the limitations, risks, security and privacy concerns of performing an evaluation and management service by telephone and the availability of in person appointments. I also discussed with the patient that there may be a patient responsible charge related to this service. The patient expressed understanding and verbally consented to this telephonic visit.    Interactive audio and video telecommunications were attempted between this provider and patient, however failed, due to patient having technical difficulties OR patient did not have access to video capability.  We continued and completed visit with audio only.     Vital signs may be patient reported or missing.  Subjective:   Deborah Gardner is a 76 y.o. female who presents for Medicare Annual (Subsequent) preventive examination.  Review of Systems     Cardiac Risk Factors include: advanced age (>63mn, >>28women);dyslipidemia;hypertension     Objective:    Today's Vitals   01/27/22 1026  Weight: 153 lb (69.4 kg)  Height: '5\' 2"'$  (1.575 m)   Body mass index is 27.98 kg/m.     01/27/2022   10:32 AM 01/24/2021    2:43 PM 05/02/2018    3:32 PM 04/26/2017    8:29 AM 04/25/2016   10:03 AM  Advanced Directives  Does Patient Have a Medical Advance Directive? Yes Yes Yes Yes Yes  Type of AParamedicof [place]ur[place]Living will [place]Living will [place]uchanan DamLiving will H[place]Living will HUniversity at BuffaloLiving will  Does patient want to make changes to medical advance directive?     No - Patient declined  Copy of [place] Chart? No - copy requested No  - copy requested No - copy requested No - copy requested No - copy requested    Current Medications (verified) Outpatient Encounter Medications as of 01/27/2022  Medication Sig   acetaminophen (TYLENOL) 325 MG tablet Take 650 mg by mouth every 6 (six) hours as needed.   celecoxib (CELEBREX) 200 MG capsule Take by mouth. Uses prn   Cholecalciferol (VITAMIN D3) 25 MCG (1000 UT) CAPS Take 1 capsule by mouth in the morning and at bedtime.   escitalopram (LEXAPRO) 10 MG tablet TAKE 1 TABLET BY MOUTH EVERY DAY   fluticasone (FLONASE) 50 MCG/ACT nasal spray SPRAY 2 SPRAYS INTO EACH NOSTRIL EVERY DAY AS NEEDED   hydrochlorothiazide (HYDRODIURIL) 25 MG tablet TAKE 1 TABLET BY MOUTH EVERY DAY   loratadine (CLARITIN) 10 MG tablet Take 1 tablet (10 mg total) by mouth daily.   omeprazole (PRILOSEC) 20 MG capsule Take one tablet before a meal daily   rosuvastatin (CRESTOR) 10 MG tablet TAKE 1 TABLET BY MOUTH EVERY DAY   timolol (TIMOPTIC) 0.5 % ophthalmic solution Place 1 drop into both eyes 2 (two) times daily.   guaiFENesin (MUCINEX) 600 MG 12 hr tablet Take by mouth 2 (two) times daily. (Patient not taking: Reported on 01/27/2022)   No facility-administered encounter medications on file as of 01/27/2022.    Allergies (verified) Atorvastatin, Lotrel [amlodipine besy-benazepril hcl], Molnupiravir, Sulfa antibiotics, and Amlodipine besylate   History: Past Medical History:  Diagnosis Date   Allergic rhinitis    Allergy    Arthritis    Cataract    bil   Colon  polyps    Esophagitis    GERD (gastroesophageal reflux disease)    HH (hiatus hernia)    HTN (hypertension)    Hyperlipidemia    Increased pressure in the eye    OA (osteoarthritis)    hands   Ocular tension increased    at risk for glaucoma   Osteoporosis    Rectal pain    Past Surgical History:  Procedure Laterality Date   ABDOMINAL HYSTERECTOMY     still has ovaries   APPENDECTOMY     BREAST CYST ASPIRATION  07/2001    CATARACT EXTRACTION  03/2006   ESOPHAGOGASTRODUODENOSCOPY  05/2007   HEMORRHOID SURGERY     KNEE ARTHROSCOPY WITH MENISCAL REPAIR  2021   rectal sphincterotomy     Retinal laser surgery     UPPER GASTROINTESTINAL ENDOSCOPY     Family History  Problem Relation Age of Onset   Colon cancer Father    Pneumonia Father    Thyroid disease Mother    Depression Mother    Hypertension Mother    Osteoporosis Mother    Liver disease Brother        from alcohol   Breast cancer Paternal Aunt    Alcohol abuse Brother        terminal    Alcohol abuse Brother    Thyroid disease Other        half sister   Colon cancer Other        Aunt   Breast cancer Maternal Aunt    Breast cancer Paternal Aunt    Colon polyps Neg Hx    Esophageal cancer Neg Hx    Stomach cancer Neg Hx    Rectal cancer Neg Hx    Social History   Socioeconomic History   Marital status: Widowed    Spouse name: Not on file   Number of children: 2   Years of education: Not on file   Highest education level: Not on file  Occupational History   Occupation: Retired    Fish farm manager: RETIRED  Tobacco Use   Smoking status: Never   Smokeless tobacco: Never  Vaping Use   Vaping Use: Never used  Substance and Sexual Activity   Alcohol use: No    Alcohol/week: 0.0 standard drinks of alcohol   Drug use: No   Sexual activity: Not Currently    Partners: Male    Birth control/protection: Surgical  Other Topics Concern   Not on file  Social History Narrative   Married      2 children      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 with husband,  and cares for her brother--who had alcohol and stroke      Cared for elderly mother for years (she passed in 2009)   Social Determinants of Health   Financial Resource Strain: Low Risk  (01/27/2022)   Overall Financial Resource Strain (CARDIA)    Difficulty of Paying Living Expenses: Not hard at all  Food Insecurity: No Food Insecurity (01/27/2022)   Hunger Vital Sign    Worried About  Running Out of Food in the Last Year: Never true    [place] in the Last Year: Never true  Transportation Needs: No Transportation Needs (01/27/2022)   PRAPARE - Hydrologist (Medical): No    Lack of Transportation (Non-Medical): No  Physical Activity: Inactive (01/27/2022)   Exercise Vital Sign    Days of Exercise per Week: 0 days  Minutes of Exercise per Session: 0 min  Stress: No Stress Concern Present (01/27/2022)   [place]    Feeling of Stress : Not at all  Social Connections: Unknown (06/26/2018)   Social Connection and Isolation Panel [NHANES]    Frequency of Communication with Friends and Family: Not on file    Frequency of Social Gatherings with Friends and Family: Not on file    Attends Religious Services: Not on file    Active Member of Clubs or Organizations: Not on file    Attends Archivist Meetings: Not on file    Marital Status: Widowed    Tobacco Counseling Counseling given: Not Answered   Clinical Intake:  Pre-visit preparation completed: Yes  Pain : No/denies pain     Nutritional Status: BMI 25 -29 Overweight Nutritional Risks: None Diabetes: No  How often do you need to have someone help you when you read instructions, pamphlets, or other written materials from your doctor or pharmacy?: 1 - Never What is the last grade level you completed in school?: 12th grade  Diabetic? no  Interpreter Needed?: No  Information entered by :: NAllen LPN   Activities of Daily Living    01/27/2022   10:33 AM  In your present state of health, do you have any difficulty performing the following activities:  Hearing? 0  Vision? 0  Difficulty concentrating or making decisions? 0  Walking or climbing stairs? 0  Dressing or bathing? 0  Doing errands, shopping? 0  Preparing Food and eating ? N  Using the Toilet? N  In the past six months, have you  accidently leaked urine? Y  Do you have problems with loss of bowel control? N  Managing your Medications? N  Managing your Finances? N  Housekeeping or managing your Housekeeping? N    Patient Care Team: Tysinger, Camelia Eng, PA-C as PCP - General (Family Medicine)  Indicate any recent Medical Services you may have received from other than [facility] providers in the past year (date may be approximate).     Assessment:   This is a routine wellness examination for Jalaysia.  Hearing/Vision screen Vision Screening - Comments:: Regular eye exams, Dr. Nicki Reaper  Dietary issues and exercise activities discussed: Current Exercise Habits: The patient does not participate in regular exercise at present   Goals Addressed             This Visit's Progress    Patient Stated       01/27/2022, wants to start walking       Depression Screen    01/27/2022   10:33 AM 09/27/2021    1:01 PM 01/24/2021    2:45 PM 05/02/2018    3:33 PM 04/26/2017    8:30 AM 04/25/2016    9:54 AM 12/02/2014    2:35 PM  PHQ 2/9 Scores  PHQ - 2 Score 0 0 0 0 0 0 0  PHQ- 9 Score 0  0 0 0      Fall Risk    01/27/2022   10:33 AM 09/27/2021    1:00 PM 01/24/2021    2:44 PM 02/12/2019   10:37 AM 05/02/2018    3:33 PM  Fall Risk   Falls in the past year? 0 1 0 0 No  Number falls in past yr: 0 0 0 0   Injury with Fall? 0 0 0 0   Risk for fall due to : Medication side effect Impaired balance/gait  Medication side effect    Follow up Falls evaluation completed;Education provided;Falls prevention discussed Falls evaluation completed Falls evaluation completed;Falls prevention discussed      FALL RISK PREVENTION PERTAINING TO THE HOME:  Any stairs in or around the home? No  If so, are there any without handrails?  N/a Home free of loose throw rugs in walkways, pet beds, electrical cords, etc? Yes  Adequate lighting in your home to reduce risk of falls? Yes   ASSISTIVE DEVICES UTILIZED TO PREVENT FALLS:  Life alert?  No  Use of a cane, walker or w/c? No  Grab bars in the bathroom? Yes  Shower chair or bench in shower? No  Elevated toilet seat or a handicapped toilet? Yes   TIMED UP AND GO:  Was the test performed? No .      Cognitive Function:    01/24/2021    2:47 PM 05/02/2018    3:33 PM 04/26/2017    8:30 AM 04/25/2016   10:13 AM  MMSE - Mini Mental State Exam  Orientation to time '5 5 5 5  '$ Orientation to Place '5 5 5 5  '$ Registration '3 3 3 3  '$ Attention/ Calculation 5 0 0 0  Recall '3 3 3 3  '$ Language- name 2 objects  0 0 0  Language- repeat '1 1 1 1  '$ Language- follow 3 step command  '3 3 3  '$ Language- read & follow direction  0 0 0  Write a sentence  0 0 0  Copy design  0 0 0  Total score  '20 20 20        '$ 01/27/2022   10:36 AM  6CIT Screen  What Year? 0 points  What month? 0 points  What time? 0 points  Count back from 20 0 points  Months in reverse 0 points  Repeat phrase 4 points  Total Score 4 points    Immunizations Immunization History  Administered Date(s) Administered   Fluad Quad(high Dose 65+) 03/18/2020   Influenza Split 03/11/2011, 03/28/2012, 04/10/2016   Influenza Whole 05/13/2010   Influenza, High Dose Seasonal PF 05/01/2013, 05/18/2014, 09/10/2015, 04/10/2016, 04/12/2017, 05/07/2018, 04/02/2019, 04/20/2021   PFIZER(Purple Top)SARS-COV-2 Vaccination 09/22/2019, 10/13/2019   Pneumococcal Conjugate-13 12/02/2014   Pneumococcal Polysaccharide-23 03/14/2011   Td 08/30/2004   Zoster, Live 11/24/2009    TDAP status: Due, Education has been provided regarding the importance of this vaccine. Advised may receive this vaccine at local pharmacy or Health Dept. Aware to provide a copy of the vaccination record if obtained from local pharmacy or Health Dept. Verbalized acceptance and understanding.  Flu Vaccine status: Up to date  Pneumococcal vaccine status: Up to date  Covid-19 vaccine status: Completed vaccines  Qualifies for Shingles Vaccine? Yes   Zostavax  completed Yes   Shingrix Completed?: No.    Education has been provided regarding the importance of this vaccine. Patient has been advised to call insurance company to determine out of pocket expense if they have not yet received this vaccine. Advised may also receive vaccine at local pharmacy or Health Dept. Verbalized acceptance and understanding.  Screening Tests Health Maintenance  Topic Date Due   Zoster Vaccines- Shingrix (1 of 2) Never done   TETANUS/TDAP  08/29/2024 (Originally 08/30/2014)   COVID-19 Vaccine (3 - Pfizer series) 03/02/2025 (Originally 12/08/2019)   INFLUENZA VACCINE  02/07/2022   MAMMOGRAM  10/05/2022   COLONOSCOPY (Pts 45-20yr Insurance coverage will need to be confirmed)  06/27/2023   Pneumonia Vaccine 76 Years old  Completed  DEXA SCAN  Completed   Hepatitis C Screening  Completed   HPV VACCINES  Aged Out    Health Maintenance  Health Maintenance Due  Topic Date Due   Zoster Vaccines- Shingrix (1 of 2) Never done    Colorectal cancer screening: Type of screening: Colonoscopy. Completed 06/26/2018. Repeat every 5 years  Mammogram status: Completed 10/04/2021. Repeat every year  Bone Density status: scheduled for 03/15/2022  Lung Cancer Screening: (Low Dose CT Chest recommended if Age 67-80 years, 30 pack-year currently smoking OR have quit w/in 15years.) does not qualify.   Lung Cancer Screening Referral: no  Additional Screening:  Hepatitis C Screening: does qualify; Completed 04/25/2016  Vision Screening: Recommended annual ophthalmology exams for early detection of glaucoma and other disorders of the eye. Is the patient up to date with their annual eye exam?  Yes  Who is the provider or what is the name of the office in which the patient attends annual eye exams? Dr. Nicki Reaper If pt is not established with a provider, would they like to be referred to a provider to establish care? No .   Dental Screening: Recommended annual dental exams for proper  oral hygiene  Community Resource Referral / Chronic Care Management: CRR required this visit?  No   CCM required this visit?  No      Plan:     I have personally reviewed and noted the following in the patient's chart:   Medical and social history Use of alcohol, tobacco or illicit drugs  Current medications and supplements including opioid prescriptions.  Functional ability and status Nutritional status Physical activity Advanced directives List of other physicians Hospitalizations, surgeries, and ER visits in previous 12 months Vitals Screenings to include cognitive, depression, and falls Referrals and appointments  In addition, I have reviewed and discussed with patient certain preventive protocols, quality metrics, and best practice recommendations. A written personalized care plan for preventive services as well as general preventive health recommendations were provided to patient.     Kellie Simmering, LPN   03/07/5620   Nurse Notes: none  Due to this being a virtual visit, the after visit summary with patients personalized plan was offered to patient via mail or my-chart.to pick up at office at next visit

## 2022-01-31 ENCOUNTER — Telehealth: Payer: Self-pay | Admitting: Medical

## 2022-01-31 ENCOUNTER — Other Ambulatory Visit: Payer: Self-pay

## 2022-01-31 DIAGNOSIS — I10 Essential (primary) hypertension: Secondary | ICD-10-CM

## 2022-01-31 MED ORDER — HYDROCHLOROTHIAZIDE 25 MG PO TABS: 25.0000 mg | ORAL_TABLET | Freq: Every day | ORAL | 2 refills | Status: DC

## 2022-01-31 NOTE — Telephone Encounter (Signed)
Meds sent

## 2022-01-31 NOTE — Telephone Encounter (Signed)
Fax from [facility] HCTZ 25 mg 1 tablet by mouth daily #90  "Patient requests new rx, completely out of meds

## 2022-02-10 ENCOUNTER — Other Ambulatory Visit: Payer: Self-pay | Admitting: Medical

## 2022-02-15 DIAGNOSIS — F419 Anxiety disorder, unspecified: Secondary | ICD-10-CM | POA: Diagnosis not present

## 2022-02-15 DIAGNOSIS — H40059 Ocular hypertension, unspecified eye: Secondary | ICD-10-CM | POA: Diagnosis not present

## 2022-02-15 DIAGNOSIS — E785 Hyperlipidemia, unspecified: Secondary | ICD-10-CM | POA: Diagnosis not present

## 2022-02-15 DIAGNOSIS — K219 Gastro-esophageal reflux disease without esophagitis: Secondary | ICD-10-CM | POA: Diagnosis not present

## 2022-02-15 DIAGNOSIS — I10 Essential (primary) hypertension: Secondary | ICD-10-CM | POA: Diagnosis not present

## 2022-02-15 DIAGNOSIS — R32 Unspecified urinary incontinence: Secondary | ICD-10-CM | POA: Diagnosis not present

## 2022-02-15 DIAGNOSIS — J301 Allergic rhinitis due to pollen: Secondary | ICD-10-CM | POA: Diagnosis not present

## 2022-02-15 DIAGNOSIS — M1990 Unspecified osteoarthritis, unspecified site: Secondary | ICD-10-CM | POA: Diagnosis not present

## 2022-02-15 DIAGNOSIS — K5900 Constipation, unspecified: Secondary | ICD-10-CM | POA: Diagnosis not present

## 2022-02-20 DIAGNOSIS — M5136 Other intervertebral disc degeneration, lumbar region: Secondary | ICD-10-CM | POA: Diagnosis not present

## 2022-02-20 DIAGNOSIS — M9904 Segmental and somatic dysfunction of sacral region: Secondary | ICD-10-CM | POA: Diagnosis not present

## 2022-02-20 DIAGNOSIS — M9905 Segmental and somatic dysfunction of pelvic region: Secondary | ICD-10-CM | POA: Diagnosis not present

## 2022-02-20 DIAGNOSIS — M9903 Segmental and somatic dysfunction of lumbar region: Secondary | ICD-10-CM | POA: Diagnosis not present

## 2022-03-06 ENCOUNTER — Other Ambulatory Visit: Payer: Self-pay | Admitting: Medical

## 2022-03-07 ENCOUNTER — Other Ambulatory Visit: Payer: Self-pay | Admitting: Family Medicine

## 2022-03-08 DIAGNOSIS — M5136 Other intervertebral disc degeneration, lumbar region: Secondary | ICD-10-CM | POA: Diagnosis not present

## 2022-03-08 DIAGNOSIS — M9905 Segmental and somatic dysfunction of pelvic region: Secondary | ICD-10-CM | POA: Diagnosis not present

## 2022-03-08 DIAGNOSIS — M9904 Segmental and somatic dysfunction of sacral region: Secondary | ICD-10-CM | POA: Diagnosis not present

## 2022-03-08 DIAGNOSIS — M9903 Segmental and somatic dysfunction of lumbar region: Secondary | ICD-10-CM | POA: Diagnosis not present

## 2022-03-15 ENCOUNTER — Ambulatory Visit
Admission: RE | Admit: 2022-03-15 | Discharge: 2022-03-15 | Disposition: A | Discharge status: Home / Self Care | Payer: Medicare PPO | Source: Ambulatory Visit | Attending: Medical | Admitting: Medical

## 2022-03-15 DIAGNOSIS — Z780 Asymptomatic menopausal state: Secondary | ICD-10-CM

## 2022-03-15 DIAGNOSIS — M810 Age-related osteoporosis without current pathological fracture: Secondary | ICD-10-CM

## 2022-03-15 DIAGNOSIS — E2839 Other primary ovarian failure: Secondary | ICD-10-CM

## 2022-03-15 DIAGNOSIS — M85851 Other specified disorders of bone density and structure, right thigh: Secondary | ICD-10-CM | POA: Diagnosis not present

## 2022-04-03 DIAGNOSIS — M5136 Other intervertebral disc degeneration, lumbar region: Secondary | ICD-10-CM | POA: Diagnosis not present

## 2022-04-03 DIAGNOSIS — M9905 Segmental and somatic dysfunction of pelvic region: Secondary | ICD-10-CM | POA: Diagnosis not present

## 2022-04-03 DIAGNOSIS — M9904 Segmental and somatic dysfunction of sacral region: Secondary | ICD-10-CM | POA: Diagnosis not present

## 2022-04-03 DIAGNOSIS — M9903 Segmental and somatic dysfunction of lumbar region: Secondary | ICD-10-CM | POA: Diagnosis not present

## 2022-04-04 ENCOUNTER — Other Ambulatory Visit: Payer: Self-pay | Admitting: Orthopedic Surgery

## 2022-04-04 DIAGNOSIS — L989 Disorder of the skin and subcutaneous tissue, unspecified: Secondary | ICD-10-CM | POA: Diagnosis not present

## 2022-04-04 DIAGNOSIS — R2232 Localized swelling, mass and lump, left upper limb: Secondary | ICD-10-CM | POA: Diagnosis not present

## 2022-04-04 DIAGNOSIS — G5603 Carpal tunnel syndrome, bilateral upper limbs: Secondary | ICD-10-CM | POA: Diagnosis not present

## 2022-04-20 ENCOUNTER — Other Ambulatory Visit: Payer: Self-pay | Admitting: Family Medicine

## 2022-05-02 DIAGNOSIS — H5213 Myopia, bilateral: Secondary | ICD-10-CM | POA: Diagnosis not present

## 2022-05-02 DIAGNOSIS — H35371 Puckering of macula, right eye: Secondary | ICD-10-CM | POA: Diagnosis not present

## 2022-05-05 ENCOUNTER — Telehealth: Payer: Self-pay | Admitting: Medical

## 2022-05-05 MED ORDER — ROSUVASTATIN CALCIUM 10 MG PO TABS: 10.0000 mg | ORAL_TABLET | Freq: Every day | ORAL | 3 refills | Status: DC

## 2022-05-05 MED ORDER — ESCITALOPRAM OXALATE 10 MG PO TABS: 10.0000 mg | ORAL_TABLET | Freq: Every day | ORAL | 1 refills | Status: DC

## 2022-05-05 NOTE — Telephone Encounter (Signed)
Pt needs refill Rosuvastatin & Escitalopram to [facility], I scheduled med check for 11/23

## 2022-05-06 ENCOUNTER — Other Ambulatory Visit: Payer: Self-pay | Admitting: Family Medicine

## 2022-05-07 ENCOUNTER — Other Ambulatory Visit: Payer: Self-pay | Admitting: Medical

## 2022-05-22 ENCOUNTER — Other Ambulatory Visit: Payer: Self-pay

## 2022-05-22 ENCOUNTER — Ambulatory Visit: Payer: Medicare PPO | Admitting: Medical

## 2022-05-22 ENCOUNTER — Encounter (HOSPITAL_BASED_OUTPATIENT_CLINIC_OR_DEPARTMENT_OTHER): Payer: Self-pay | Admitting: Orthopedic Surgery

## 2022-05-22 VITALS — BP 120/70 | HR 66 | Wt 156.4 lb

## 2022-05-22 DIAGNOSIS — M8580 Other specified disorders of bone density and structure, unspecified site: Secondary | ICD-10-CM | POA: Diagnosis not present

## 2022-05-22 DIAGNOSIS — J988 Other specified respiratory disorders: Secondary | ICD-10-CM

## 2022-05-22 NOTE — Patient Instructions (Addendum)
Recommendations You should be getting '1200mg'$  of calcium daily, either through 3-4 servings of dairy or over the counter supplements of calcium  You should be getting about 1000 units of vitamin D daily  I recommend weight bearing exercise such as resistant bands or weights 1-2 times per week  Exercise with walking or other aerobic exercise daily  We will plan to repeat your bone density test in 2 years   Respiratory tract infection: Drink plenty of water throughout the day Consider mucinex or coricidin HBP over the counter for mucous and congestion If worse sinus pressure later in the week, call and I can put you on antibiotic . Currently symptoms don't suggest bacterial sinus infection

## 2022-05-22 NOTE — Progress Notes (Signed)
Subjective:  Deborah Gardner is a 76 y.o. female who presents for Chief Complaint  Patient presents with   med check    Med check. Discuss vitamin C and Calcium, possible sinus infection- 2-3 days ago- was on vacation and had A/C running on her.      Here for concerns  She notes possible sinus infection.   Was at the beach recently, had air conditioning blowing on her a lot, and thinks she has sinus infection.   Using zinc and vitamin C.   Been going on 2-3 days.   Headaches was bad last few days, but overall feels better today.  Has had a little cough, more yesterday.  Has sinus headaches, no fever, no NVD, has mild sore throat.   No ear pain.  Recently she increase to calcium '600mg'$  BID whereas she was using once daily prior.   Was on vitamin D 1000 u daily.   Taking '50mg'$  zinc, vitamin C '500mg'$ , Ca 500+D3 7mg in one pill  Having carpal tunnel syndrome in 1 week, Dr. KFredna Dow  No other aggravating or relieving factors.    No other c/o.  Past Medical History:  Diagnosis Date   Allergic rhinitis    Allergy    Arthritis    Cataract    bil   Colon polyps    Esophagitis    GERD (gastroesophageal reflux disease)    HH (hiatus hernia)    HTN (hypertension)    Hyperlipidemia    Increased pressure in the eye    OA (osteoarthritis)    hands   Ocular tension increased    at risk for glaucoma   Osteoporosis    Rectal pain    Current Outpatient Medications on File Prior to Visit  Medication Sig Dispense Refill   Calcium Carb-Cholecalciferol (CALCIUM + VITAMIN D3 PO) Take by mouth.     escitalopram (LEXAPRO) 10 MG tablet Take 1 tablet (10 mg total) by mouth daily. 90 tablet 1   fluticasone (FLONASE) 50 MCG/ACT nasal spray SPRAY 2 SPRAYS INTO EACH NOSTRIL EVERY DAY AS NEEDED 48 mL 1   hydrochlorothiazide (HYDRODIURIL) 25 MG tablet Take 1 tablet (25 mg total) by mouth daily. 90 tablet 2   Ibuprofen 200 MG CAPS Take by mouth.     loratadine (CLARITIN) 10 MG tablet Take 1 tablet (10 mg  total) by mouth daily. 90 tablet 3   omeprazole (PRILOSEC) 20 MG capsule TAKE ONE TABLET BEFORE A MEAL DAILY 90 capsule 0   rosuvastatin (CRESTOR) 10 MG tablet Take 1 tablet (10 mg total) by mouth daily. 90 tablet 3   timolol (TIMOPTIC) 0.5 % ophthalmic solution Place 1 drop into both eyes 2 (two) times daily.     Zinc Acetate, Oral, (ZINC ACETATE PO) Take by mouth.     acetaminophen (TYLENOL) 325 MG tablet Take 650 mg by mouth every 6 (six) hours as needed. (Patient not taking: Reported on 05/22/2022)     celecoxib (CELEBREX) 200 MG capsule Take by mouth. Uses prn (Patient not taking: Reported on 05/22/2022)     No current facility-administered medications on file prior to visit.     The following portions of the patient's history were reviewed and updated as appropriate: allergies, current medications, past family history, past medical history, past social history, past surgical history and problem list.  ROS Otherwise as in subjective above  Objective: BP 120/70   Pulse 66   Wt 156 lb 6.4 oz (70.9 kg)   BMI 29.55 kg/m  General appearance: alert, no distress, well developed, well nourished HEENT: normocephalic, sclerae anicteric, conjunctiva pink and moist, TMs pearly, nares patent, clear discharge, mild erythema, pharynx normal Oral cavity: MMM, no lesions Neck: supple, no lymphadenopathy, no thyromegaly, no masses Heart: RRR, normal S1, S2, no murmurs Lungs: CTA bilaterally, no wheezes, rhonchi, or rales Pulses: 2+ radial pulses, 2+ pedal pulses, normal cap refill Ext: no edema   Assessment: Encounter Diagnoses  Name Primary?   Respiratory tract infection Yes   Osteopenia, unspecified location      Plan: Discussed symptoms, concerned, discussed bone density test from 03/2022.    Discussed following recommendations  Patient Instructions  Recommendations You should be getting '1200mg'$  of calcium daily, either through 3-4 servings of dairy or over the counter  supplements of calcium  You should be getting about 1000 units of vitamin D daily  I recommend weight bearing exercise such as resistant bands or weights 1-2 times per week  Exercise with walking or other aerobic exercise daily  We will plan to repeat your bone density test in 2 years   Respiratory tract infection: Drink plenty of water throughout the day Consider mucinex or coricidin HBP over the counter for mucous and congestion If worse sinus pressure later in the week, call and I can put you on antibiotic . Currently symptoms don't suggest bacterial sinus infection       I wished her well on her carpal tunnel surgery in 1 week   Deborah Gardner was seen today for med check.  Diagnoses and all orders for this visit:  Respiratory tract infection  Osteopenia, unspecified location    Follow up: soon for med check

## 2022-05-24 ENCOUNTER — Encounter (HOSPITAL_BASED_OUTPATIENT_CLINIC_OR_DEPARTMENT_OTHER)
Admission: RE | Admit: 2022-05-24 | Discharge: 2022-05-24 | Disposition: A | Discharge status: Home / Self Care | Payer: Medicare PPO | Source: Ambulatory Visit | Attending: Orthopedic Surgery | Admitting: Orthopedic Surgery

## 2022-05-24 DIAGNOSIS — Z01810 Encounter for preprocedural cardiovascular examination: Secondary | ICD-10-CM | POA: Insufficient documentation

## 2022-05-24 DIAGNOSIS — M9903 Segmental and somatic dysfunction of lumbar region: Secondary | ICD-10-CM | POA: Diagnosis not present

## 2022-05-24 DIAGNOSIS — M5136 Other intervertebral disc degeneration, lumbar region: Secondary | ICD-10-CM | POA: Diagnosis not present

## 2022-05-24 DIAGNOSIS — M9904 Segmental and somatic dysfunction of sacral region: Secondary | ICD-10-CM | POA: Diagnosis not present

## 2022-05-24 DIAGNOSIS — M9905 Segmental and somatic dysfunction of pelvic region: Secondary | ICD-10-CM | POA: Diagnosis not present

## 2022-05-24 LAB — BASIC METABOLIC PANEL
Anion gap: 12 (ref 5–15)
BUN: 16 mg/dL (ref 8–23)
CO2: 28 mmol/L (ref 22–32)
Calcium: 9.3 mg/dL (ref 8.9–10.3)
Chloride: 100 mmol/L (ref 98–111)
Creatinine, Ser: 0.91 mg/dL (ref 0.44–1.00)
GFR, Estimated: >60 mL/min (ref >60)
Glucose, Bld: 88 mg/dL (ref 70–99)
Potassium: 3.7 mmol/L (ref 3.5–5.1)
Sodium: 140 mmol/L (ref 135–145)

## 2022-05-24 NOTE — Progress Notes (Signed)

## 2022-05-24 NOTE — Progress Notes (Signed)
Chart reviewed by Dr. Daiva Huge, ok to proceed as planned with surgery at [facility].

## 2022-05-29 ENCOUNTER — Encounter (HOSPITAL_BASED_OUTPATIENT_CLINIC_OR_DEPARTMENT_OTHER)
Admission: RE | Disposition: A | Discharge status: Home / Self Care | Payer: Self-pay | Source: Home / Self Care | Attending: Orthopedic Surgery

## 2022-05-29 ENCOUNTER — Ambulatory Visit (HOSPITAL_BASED_OUTPATIENT_CLINIC_OR_DEPARTMENT_OTHER): Payer: Medicare PPO | Admitting: Certified Registered"

## 2022-05-29 ENCOUNTER — Other Ambulatory Visit: Payer: Self-pay

## 2022-05-29 ENCOUNTER — Ambulatory Visit (HOSPITAL_BASED_OUTPATIENT_CLINIC_OR_DEPARTMENT_OTHER)
Admission: RE | Admit: 2022-05-29 | Discharge: 2022-05-29 | Disposition: A | Discharge status: Home / Self Care | Payer: Medicare PPO | Attending: Orthopedic Surgery | Admitting: Orthopedic Surgery

## 2022-05-29 ENCOUNTER — Encounter (HOSPITAL_BASED_OUTPATIENT_CLINIC_OR_DEPARTMENT_OTHER): Payer: Self-pay | Admitting: Orthopedic Surgery

## 2022-05-29 DIAGNOSIS — K219 Gastro-esophageal reflux disease without esophagitis: Secondary | ICD-10-CM | POA: Insufficient documentation

## 2022-05-29 DIAGNOSIS — M1990 Unspecified osteoarthritis, unspecified site: Secondary | ICD-10-CM

## 2022-05-29 DIAGNOSIS — Z79899 Other long term (current) drug therapy: Secondary | ICD-10-CM | POA: Insufficient documentation

## 2022-05-29 DIAGNOSIS — I4510 Unspecified right bundle-branch block: Secondary | ICD-10-CM | POA: Insufficient documentation

## 2022-05-29 DIAGNOSIS — G5601 Carpal tunnel syndrome, right upper limb: Secondary | ICD-10-CM

## 2022-05-29 DIAGNOSIS — K449 Diaphragmatic hernia without obstruction or gangrene: Secondary | ICD-10-CM | POA: Diagnosis not present

## 2022-05-29 DIAGNOSIS — I10 Essential (primary) hypertension: Secondary | ICD-10-CM

## 2022-05-29 DIAGNOSIS — Z01818 Encounter for other preprocedural examination: Secondary | ICD-10-CM

## 2022-05-29 HISTORY — PX: CARPAL TUNNEL RELEASE: SHX101

## 2022-05-29 SURGERY — CARPAL TUNNEL RELEASE
Anesthesia: Monitor Anesthesia Care | Site: Hand | Laterality: Right

## 2022-05-29 MED ORDER — CEFAZOLIN SODIUM-DEXTROSE 2-4 GM/100ML-% IV SOLN
2.0000 g | INTRAVENOUS | Status: AC
  Administered 2022-05-29: 2 g via INTRAVENOUS

## 2022-05-29 MED ORDER — PROPOFOL 10 MG/ML IV BOLUS
INTRAVENOUS | Status: DC | PRN
  Administered 2022-05-29: 20 mg via INTRAVENOUS

## 2022-05-29 MED ORDER — PROPOFOL 500 MG/50ML IV EMUL
INTRAVENOUS | Status: DC | PRN
  Administered 2022-05-29: 75 ug/kg/min via INTRAVENOUS

## 2022-05-29 MED ORDER — PROPOFOL 500 MG/50ML IV EMUL
INTRAVENOUS | Status: AC
  Filled 2022-05-29: qty 50

## 2022-05-29 MED ORDER — ONDANSETRON HCL 4 MG/2ML IJ SOLN
INTRAMUSCULAR | Status: DC | PRN
  Administered 2022-05-29: 4 mg via INTRAVENOUS

## 2022-05-29 MED ORDER — AMISULPRIDE (ANTIEMETIC) 5 MG/2ML IV SOLN: 10.0000 mg | Freq: Once | INTRAVENOUS | Status: DC | PRN

## 2022-05-29 MED ORDER — LIDOCAINE HCL (PF) 0.5 % IJ SOLN
INTRAMUSCULAR | Status: DC | PRN
  Administered 2022-05-29: 30 mL via INTRAVENOUS

## 2022-05-29 MED ORDER — ACETAMINOPHEN 500 MG PO TABS
ORAL_TABLET | ORAL | Status: AC
  Filled 2022-05-29: qty 2

## 2022-05-29 MED ORDER — ACETAMINOPHEN 500 MG PO TABS
1000.0000 mg | ORAL_TABLET | Freq: Once | ORAL | Status: AC
  Administered 2022-05-29: 1000 mg via ORAL

## 2022-05-29 MED ORDER — PROPOFOL 10 MG/ML IV BOLUS
INTRAVENOUS | Status: AC
  Filled 2022-05-29: qty 20

## 2022-05-29 MED ORDER — ONDANSETRON HCL 4 MG/2ML IJ SOLN: 4.0000 mg | Freq: Once | INTRAMUSCULAR | Status: DC | PRN

## 2022-05-29 MED ORDER — LACTATED RINGERS IV SOLN: INTRAVENOUS | Status: DC

## 2022-05-29 MED ORDER — ONDANSETRON HCL 4 MG/2ML IJ SOLN
INTRAMUSCULAR | Status: AC
  Filled 2022-05-29: qty 2

## 2022-05-29 MED ORDER — FENTANYL CITRATE (PF) 100 MCG/2ML IJ SOLN: 25.0000 ug | INTRAMUSCULAR | Status: DC | PRN

## 2022-05-29 MED ORDER — BUPIVACAINE HCL (PF) 0.25 % IJ SOLN
INTRAMUSCULAR | Status: DC | PRN
  Administered 2022-05-29: 9 mL

## 2022-05-29 MED ORDER — TRAMADOL HCL 50 MG PO TABS: ORAL_TABLET | ORAL | 0 refills | Status: DC

## 2022-05-29 SURGICAL SUPPLY — 34 items
APL PRP STRL LF DISP 70% ISPRP (MISCELLANEOUS) ×1
BLADE SURG 15 STRL LF DISP TIS (BLADE) ×2 IMPLANT
BLADE SURG 15 STRL SS (BLADE) ×2
BNDG CMPR 9X4 STRL LF SNTH (GAUZE/BANDAGES/DRESSINGS)
BNDG ELASTIC 3X5.8 VLCR STR LF (GAUZE/BANDAGES/DRESSINGS) ×1 IMPLANT
BNDG ESMARK 4X9 LF (GAUZE/BANDAGES/DRESSINGS) IMPLANT
BNDG GAUZE DERMACEA FLUFF 4 (GAUZE/BANDAGES/DRESSINGS) ×1 IMPLANT
BNDG GZE DERMACEA 4 6PLY (GAUZE/BANDAGES/DRESSINGS) ×1
CHLORAPREP W/TINT 26 (MISCELLANEOUS) ×1 IMPLANT
CORD BIPOLAR FORCEPS 12FT (ELECTRODE) ×1 IMPLANT
COVER BACK TABLE 60X90IN (DRAPES) ×1 IMPLANT
COVER MAYO STAND STRL (DRAPES) ×1 IMPLANT
CUFF TOURN SGL QUICK 18X4 (TOURNIQUET CUFF) ×1 IMPLANT
DRAPE EXTREMITY T 121X128X90 (DISPOSABLE) ×1 IMPLANT
DRAPE SURG 17X23 STRL (DRAPES) ×1 IMPLANT
GAUZE PAD ABD 8X10 STRL (GAUZE/BANDAGES/DRESSINGS) ×1 IMPLANT
GAUZE SPONGE 4X4 12PLY STRL (GAUZE/BANDAGES/DRESSINGS) ×1 IMPLANT
GAUZE XEROFORM 1X8 LF (GAUZE/BANDAGES/DRESSINGS) ×1 IMPLANT
GLOVE BIO SURGEON STRL SZ7.5 (GLOVE) ×1 IMPLANT
GLOVE BIOGEL PI IND STRL 8 (GLOVE) ×1 IMPLANT
GOWN STRL REUS W/ TWL LRG LVL3 (GOWN DISPOSABLE) ×1 IMPLANT
GOWN STRL REUS W/TWL LRG LVL3 (GOWN DISPOSABLE) ×1
GOWN STRL REUS W/TWL XL LVL3 (GOWN DISPOSABLE) ×1 IMPLANT
NDL HYPO 25X1 1.5 SAFETY (NEEDLE) ×1 IMPLANT
NEEDLE HYPO 25X1 1.5 SAFETY (NEEDLE) ×1 IMPLANT
NS IRRIG 1000ML POUR BTL (IV SOLUTION) ×1 IMPLANT
PACK BASIN DAY SURGERY FS (CUSTOM PROCEDURE TRAY) ×1 IMPLANT
PADDING CAST ABS COTTON 4X4 ST (CAST SUPPLIES) ×1 IMPLANT
STOCKINETTE 4X48 STRL (DRAPES) ×1 IMPLANT
SUT ETHILON 4 0 PS 2 18 (SUTURE) ×1 IMPLANT
SYR BULB EAR ULCER 3OZ GRN STR (SYRINGE) ×1 IMPLANT
SYR CONTROL 10ML LL (SYRINGE) ×1 IMPLANT
TOWEL GREEN STERILE FF (TOWEL DISPOSABLE) ×2 IMPLANT
UNDERPAD 30X36 HEAVY ABSORB (UNDERPADS AND DIAPERS) ×1 IMPLANT

## 2022-05-29 NOTE — Anesthesia Postprocedure Evaluation (Signed)
Anesthesia Post Note  Patient: Deborah Gardner  Procedure(s) Performed: RIGHT CARPAL TUNNEL RELEASE (Right: Hand)     Patient location during evaluation: PACU Anesthesia Type: MAC and Bier Block Level of consciousness: awake and alert Pain management: pain level controlled Vital Signs Assessment: post-procedure vital signs reviewed and stable Respiratory status: spontaneous breathing, nonlabored ventilation and respiratory function stable Cardiovascular status: blood pressure returned to baseline and stable Postop Assessment: no apparent nausea or vomiting Anesthetic complications: no   No notable events documented.  Last Vitals:  Vitals:   05/29/22 1415 05/29/22 1430  BP: (!) 168/84 (!) 170/72  Pulse: (!) 54 (!) 58  Resp: 14 16  Temp:  36.6 C  SpO2: 99% 96%    Last Pain:  Vitals:   05/29/22 1430  TempSrc:   PainSc: 0-No pain                 Pervis Hocking

## 2022-05-29 NOTE — H&P (Signed)
Deborah Gardner is an 76 y.o. female.   Chief Complaint: carpal tunnel syndrome HPI: 76 yo female with numbness and tingling right hand.  Positive nerve conduction studies.  She wishes to have carpal tunnel release.  Allergies:  Allergies  Allergen Reactions   Atorvastatin Other (See Comments)    Memory loss   Lotrel [Amlodipine Besy-Benazepril Hcl] Hives    Most likely from the benazepril    Molnupiravir     Sick feeling   Sulfa Antibiotics     Lip swelling   Amlodipine Besylate Rash    Past Medical History:  Diagnosis Date   Allergic rhinitis    Allergy    Arthritis    Cataract    bil   Colon polyps    Esophagitis    GERD (gastroesophageal reflux disease)    HH (hiatus hernia)    HTN (hypertension)    Hyperlipidemia    Increased pressure in the eye    OA (osteoarthritis)    hands   Ocular tension increased    at risk for glaucoma   Osteoporosis    Rectal pain     Past Surgical History:  Procedure Laterality Date   ABDOMINAL HYSTERECTOMY     still has ovaries   APPENDECTOMY     BREAST CYST ASPIRATION  07/2001   CATARACT EXTRACTION  03/2006   ESOPHAGOGASTRODUODENOSCOPY  05/2007   HEMORRHOID SURGERY     KNEE ARTHROSCOPY WITH MENISCAL REPAIR  2021   rectal sphincterotomy     Retinal laser surgery     UPPER GASTROINTESTINAL ENDOSCOPY      Family History: Family History  Problem Relation Age of Onset   Colon cancer Father    Pneumonia Father    Thyroid disease Mother    Depression Mother    Hypertension Mother    Osteoporosis Mother    Liver disease Brother        from alcohol   Breast cancer Paternal Aunt    Alcohol abuse Brother        terminal    Alcohol abuse Brother    Thyroid disease Other        half sister   Colon cancer Other        Aunt   Breast cancer Maternal Aunt    Breast cancer Paternal Aunt    Colon polyps Neg Hx    Esophageal cancer Neg Hx    Stomach cancer Neg Hx    Rectal cancer Neg Hx     Social History:   reports  that she has never smoked. She has never used smokeless tobacco. She reports that she does not drink alcohol and does not use drugs.  Medications: Medications Prior to Admission  Medication Sig Dispense Refill   acetaminophen (TYLENOL) 325 MG tablet Take 650 mg by mouth every 6 (six) hours as needed.     Calcium Carb-Cholecalciferol (CALCIUM + VITAMIN D3 PO) Take by mouth.     celecoxib (CELEBREX) 200 MG capsule Take by mouth. Uses prn     escitalopram (LEXAPRO) 10 MG tablet Take 1 tablet (10 mg total) by mouth daily. 90 tablet 1   fluticasone (FLONASE) 50 MCG/ACT nasal spray SPRAY 2 SPRAYS INTO EACH NOSTRIL EVERY DAY AS NEEDED 48 mL 1   hydrochlorothiazide (HYDRODIURIL) 25 MG tablet Take 1 tablet (25 mg total) by mouth daily. 90 tablet 2   Ibuprofen 200 MG CAPS Take by mouth.     loratadine (CLARITIN) 10 MG tablet Take 1 tablet (10  mg total) by mouth daily. 90 tablet 3   omeprazole (PRILOSEC) 20 MG capsule TAKE ONE TABLET BEFORE A MEAL DAILY 90 capsule 0   rosuvastatin (CRESTOR) 10 MG tablet Take 1 tablet (10 mg total) by mouth daily. 90 tablet 3   timolol (TIMOPTIC) 0.5 % ophthalmic solution Place 1 drop into both eyes 2 (two) times daily.     Zinc Acetate, Oral, (ZINC ACETATE PO) Take by mouth.      No results found for this or any previous visit (from the past 48 hour(s)).  No results found.    Blood pressure (!) 152/85, pulse (!) 56, temperature 98.2 F (36.8 C), temperature source Oral, resp. rate 16, height '5\' 1"'$  (1.549 m), weight 69.2 kg, SpO2 100 %.  General appearance: alert, cooperative, and appears stated age Head: Normocephalic, without obvious abnormality, atraumatic Neck: supple, symmetrical, trachea midline Extremities: Intact sensation and capillary refill all digits.  +epl/fpl/io.  No wounds.  Pulses: 2+ and symmetric Skin: Skin color, texture, turgor normal. No rashes or lesions Neurologic: Grossly normal Incision/Wound: none  Assessment/Plan Right carpal  tunnel syndrome.  Non operative and operative treatment options have been discussed with the patient and patient wishes to proceed with operative treatment. Risks, benefits, and alternatives of surgery have been discussed and the patient agrees with the plan of care.   Leanora Gardner 05/29/2022, 1:13 PM

## 2022-05-29 NOTE — Discharge Instructions (Addendum)
[facility] Instructions Hand Surgery  Wound Care: Keep your hand elevated above the level of your heart.  Do not allow it to dangle by your side.  Keep the dressing dry and do not remove it unless your doctor advises you to do so.  He will usually change it at the time of your post-op visit.  Moving your fingers is advised to stimulate circulation but will depend on the site of your surgery.  If you have a splint applied, your doctor will advise you regarding movement.  Activity: Do not drive or operate machinery today.  Rest today and then you may return to your normal activity and work as indicated by your physician.  Diet:  Drink liquids today or eat a light diet.  You may resume a regular diet tomorrow.    General expectations: Pain for two to three days. Fingers may become slightly swollen.  Call your doctor if any of the following occur: Severe pain not relieved by pain medication. Elevated temperature. Dressing soaked with blood. Inability to move fingers. White or bluish color to fingers.   Post Anesthesia Home Care Instructions  Activity: Get plenty of rest for the remainder of the day. A responsible individual must stay with you for 24 hours following the procedure.  For the next 24 hours, DO NOT: -Drive a car -Paediatric nurse -Drink alcoholic beverages -Take any medication unless instructed by your physician -Make any legal decisions or sign important papers.  Meals: Start with liquid foods such as gelatin or soup. Progress to regular foods as tolerated. Avoid greasy, spicy, heavy foods. If nausea and/or vomiting occur, drink only clear liquids until the nausea and/or vomiting subsides. Call your physician if vomiting continues.  Special Instructions/Symptoms: Your throat may feel dry or sore from the anesthesia or the breathing tube placed in your throat during surgery. If this causes discomfort, gargle with warm salt water. The discomfort should disappear within  24 hours.       Regional Anesthesia Blocks  1. Numbness or the inability to move the "blocked" extremity may last from 3-48 hours after placement. The length of time depends on the medication injected and your individual response to the medication. If the numbness is not going away after 48 hours, call your surgeon.  2. The extremity that is blocked will need to be protected until the numbness is gone and the  Strength has returned. Because you cannot feel it, you will need to take extra care to avoid injury. Because it may be weak, you may have difficulty moving it or using it. You may not know what position it is in without looking at it while the block is in effect.  3. For blocks in the legs and feet, returning to weight bearing and walking needs to be done carefully. You will need to wait until the numbness is entirely gone and the strength has returned. You should be able to move your leg and foot normally before you try and bear weight or walk. You will need someone to be with you when you first try to ensure you do not fall and possibly risk injury.  4. Bruising and tenderness at the needle site are common side effects and will resolve in a few days.  5. Persistent numbness or new problems with movement should be communicated to the surgeon or the [facility] [phone] [facility] [phone]).  Tylenol can be taken after 5:50

## 2022-05-29 NOTE — Anesthesia Preprocedure Evaluation (Addendum)
Anesthesia Evaluation  Patient identified by MRN, date of birth, ID band Patient awake    Reviewed: Allergy & Precautions, NPO status , Patient's Chart, lab work & pertinent test results  Airway Mallampati: II  TM Distance: >3 FB Neck ROM: Full    Dental no notable dental hx.    Pulmonary neg pulmonary ROS   Pulmonary exam normal breath sounds clear to auscultation       Cardiovascular hypertension, Pt. on medications Normal cardiovascular exam Rhythm:Regular Rate:Normal     Neuro/Psych negative neurological ROS  negative psych ROS   GI/Hepatic Neg liver ROS, hiatal hernia,GERD  Medicated and Controlled,,  Endo/Other  negative endocrine ROS    Renal/GU negative Renal ROS  negative genitourinary   Musculoskeletal  (+) Arthritis , Osteoarthritis,    Abdominal   Peds  Hematology negative hematology ROS (+)   Anesthesia Other Findings   Reproductive/Obstetrics negative OB ROS                             Anesthesia Physical Anesthesia Plan  ASA: 2  Anesthesia Plan: MAC and Bier Block and Bier Block-Lidocaine Only   Post-op Pain Management:    Induction:   PONV Risk Score and Plan: 2 and Propofol infusion and TIVA  Airway Management Planned: Natural Airway and Simple Face Mask  Additional Equipment: None  Intra-op Plan:   Post-operative Plan:   Informed Consent: I have reviewed the patients History and Physical, chart, labs and discussed the procedure including the risks, benefits and alternatives for the proposed anesthesia with the patient or authorized representative who has indicated his/her understanding and acceptance.     Dental advisory given  Plan Discussed with: CRNA  Anesthesia Plan Comments:        Anesthesia Quick Evaluation

## 2022-05-29 NOTE — Transfer of Care (Signed)
Immediate Anesthesia Transfer of Care Note  Patient: Deborah Gardner  Procedure(s) Performed: RIGHT CARPAL TUNNEL RELEASE (Right: Hand)  Patient Location: PACU  Anesthesia Type:MAC  Level of Consciousness: awake, alert , and oriented  Airway & Oxygen Therapy: Patient Spontanous Breathing and Patient connected to face mask oxygen  Post-op Assessment: Report given to RN and Post -op Vital signs reviewed and stable  Post vital signs: Reviewed and stable  Last Vitals:  Vitals Value Taken Time  BP 135/68 05/29/22 1355  Temp    Pulse 54 05/29/22 1357  Resp 12 05/29/22 1357  SpO2 98 % 05/29/22 1357  Vitals shown include unvalidated device data.  Last Pain:  Vitals:   05/29/22 1140  TempSrc: Oral  PainSc: 3       Patients Stated Pain Goal: 7 (51/88/41 [id number])  Complications: No notable events documented.

## 2022-05-29 NOTE — Op Note (Signed)
05/29/2022 [facility]                              OPERATIVE REPORT   PREOPERATIVE DIAGNOSIS:  Right carpal tunnel syndrome.  POSTOPERATIVE DIAGNOSIS:  Right carpal tunnel syndrome.  PROCEDURE:  Right carpal tunnel release.  SURGEON:  [name], MD  ASSISTANT:  none.  ANESTHESIA: Bier block with sedation  IV FLUIDS:  Per anesthesia flow sheet.  ESTIMATED BLOOD LOSS:  Minimal.  COMPLICATIONS:  None.  SPECIMENS:  None.  TOURNIQUET TIME:    Total Tourniquet Time Documented: Forearm (Right) - 21 minutes Total: Forearm (Right) - 21 minutes   DISPOSITION:  Stable to PACU.  LOCATION: [facility]  INDICATIONS:  76 yo female with numbness and tingling right hand.  Positive nerve conduction studies.  She wishes to have a carpal tunnel release for management of her symptoms.  Risks, benefits and alternatives of surgery were discussed including the risk of blood loss; infection; damage to nerves, vessels, tendons, ligaments, bone; failure of surgery; need for additional surgery; complications with wound healing; continued pain; recurrence of carpal tunnel syndrome; and damage to motor branch. She voiced understanding of these risks and elected to proceed.   OPERATIVE COURSE:  After being identified preoperatively by myself, the patient and I agreed upon the procedure and site of procedure.  The surgical site was marked.  Surgical consent had been signed.  She was given IV Ancef as preoperative antibiotic prophylaxis.  She was transferred to the operating room and placed on the operating room table in supine position with the Right upper extremity on an armboard.  Bier block anesthesia was induced by the anesthesiologist.  Right upper extremity was prepped and draped in normal sterile orthopaedic fashion.  A surgical pause was performed between the surgeons, anesthesia, and operating room staff, and all were in agreement as to the patient, procedure, and site  of procedure.  Tourniquet at the proximal aspect of the forearm had been inflated for the Bier block  Incision was made over the transverse carpal ligament and carried into the subcutaneous tissues by spreading technique.  Bipolar electrocautery was used to obtain hemostasis.  The palmar fascia was sharply incised.  The transverse carpal ligament was identified.  The fascia distal to the ligament was opened.  Retractor was placed and the flexor tendons were identified.  The flexor tendon to the little finger was identified and retracted radially.  The transverse carpal ligament was then incised from distal to proximal under direct visualization.  Scissors were used to split the distal aspect of the volar antebrachial fascia.  A finger was placed into the wound to ensure complete decompression, which was the case.  The nerve was examined.  It was flattened and hyperemic and was adherent to the radial leaflet.  The motor branch was identified and was intact.  The wound was copiously irrigated with sterile saline.  It was then closed with 4-0 nylon in a horizontal mattress fashion.  It was injected with 0.25% plain Marcaine to aid in postoperative analgesia.  It was dressed with sterile Xeroform, 4x4s, an ABD, and wrapped with Kerlix and an Ace bandage.  Tourniquet was deflated at 21 minutes.  Fingertips were pink with brisk capillary refill after deflation of the tourniquet.  Operative drapes were broken down.  The patient was awoken from anesthesia safely.  She was transferred back to stretcher and taken to the PACU  in stable condition.  I will see her back in the office in 1 week for postoperative followup.  I will give her a prescription for Tramadol 50 mg 1 tab PO q6 hours prn pain, dispense # 20.    [name], MD Electronically signed, 05/29/22

## 2022-05-29 NOTE — Anesthesia Procedure Notes (Signed)
Anesthesia Regional Block: Bier block (IV Regional)   Pre-Anesthetic Checklist: , timeout performed,  Correct Patient, Correct Site, Correct Laterality,  Correct Procedure, Correct Position, site marked,  Risks and benefits discussed,  Surgical consent,  Pre-op evaluation,  At surgeon's request  Laterality: Right  Prep: chloraprep       Needles:        Needle Gauge: 22     Additional Needles:   Procedures:,,,,, intact distal pulses, Esmarch exsanguination,  Single tourniquet utilized,  #20gu IV placed    Narrative:  Start time: 05/29/2022 1:27 PM End time: 05/29/2022 1:28 PM  Performed by: Personally  CRNA: Genelle Bal, CRNA

## 2022-05-30 ENCOUNTER — Encounter (HOSPITAL_BASED_OUTPATIENT_CLINIC_OR_DEPARTMENT_OTHER): Payer: Self-pay | Admitting: Orthopedic Surgery

## 2022-06-05 ENCOUNTER — Other Ambulatory Visit: Payer: Self-pay | Admitting: Medical

## 2022-06-05 DIAGNOSIS — M9903 Segmental and somatic dysfunction of lumbar region: Secondary | ICD-10-CM | POA: Diagnosis not present

## 2022-06-05 DIAGNOSIS — M9904 Segmental and somatic dysfunction of sacral region: Secondary | ICD-10-CM | POA: Diagnosis not present

## 2022-06-05 DIAGNOSIS — M5136 Other intervertebral disc degeneration, lumbar region: Secondary | ICD-10-CM | POA: Diagnosis not present

## 2022-06-05 DIAGNOSIS — M9905 Segmental and somatic dysfunction of pelvic region: Secondary | ICD-10-CM | POA: Diagnosis not present

## 2022-06-26 DIAGNOSIS — M5136 Other intervertebral disc degeneration, lumbar region: Secondary | ICD-10-CM | POA: Diagnosis not present

## 2022-06-26 DIAGNOSIS — M9905 Segmental and somatic dysfunction of pelvic region: Secondary | ICD-10-CM | POA: Diagnosis not present

## 2022-06-26 DIAGNOSIS — M9904 Segmental and somatic dysfunction of sacral region: Secondary | ICD-10-CM | POA: Diagnosis not present

## 2022-06-26 DIAGNOSIS — M9903 Segmental and somatic dysfunction of lumbar region: Secondary | ICD-10-CM | POA: Diagnosis not present

## 2022-08-03 DIAGNOSIS — M9905 Segmental and somatic dysfunction of pelvic region: Secondary | ICD-10-CM | POA: Diagnosis not present

## 2022-08-03 DIAGNOSIS — M9903 Segmental and somatic dysfunction of lumbar region: Secondary | ICD-10-CM | POA: Diagnosis not present

## 2022-08-03 DIAGNOSIS — M5136 Other intervertebral disc degeneration, lumbar region: Secondary | ICD-10-CM | POA: Diagnosis not present

## 2022-08-03 DIAGNOSIS — M9904 Segmental and somatic dysfunction of sacral region: Secondary | ICD-10-CM | POA: Diagnosis not present

## 2022-08-09 DIAGNOSIS — M9905 Segmental and somatic dysfunction of pelvic region: Secondary | ICD-10-CM | POA: Diagnosis not present

## 2022-08-09 DIAGNOSIS — L820 Inflamed seborrheic keratosis: Secondary | ICD-10-CM | POA: Diagnosis not present

## 2022-08-09 DIAGNOSIS — L570 Actinic keratosis: Secondary | ICD-10-CM | POA: Diagnosis not present

## 2022-08-09 DIAGNOSIS — D225 Melanocytic nevi of trunk: Secondary | ICD-10-CM | POA: Diagnosis not present

## 2022-08-09 DIAGNOSIS — L821 Other seborrheic keratosis: Secondary | ICD-10-CM | POA: Diagnosis not present

## 2022-08-09 DIAGNOSIS — L538 Other specified erythematous conditions: Secondary | ICD-10-CM | POA: Diagnosis not present

## 2022-08-09 DIAGNOSIS — M9903 Segmental and somatic dysfunction of lumbar region: Secondary | ICD-10-CM | POA: Diagnosis not present

## 2022-08-09 DIAGNOSIS — M5136 Other intervertebral disc degeneration, lumbar region: Secondary | ICD-10-CM | POA: Diagnosis not present

## 2022-08-09 DIAGNOSIS — M9904 Segmental and somatic dysfunction of sacral region: Secondary | ICD-10-CM | POA: Diagnosis not present

## 2022-08-09 DIAGNOSIS — L814 Other melanin hyperpigmentation: Secondary | ICD-10-CM | POA: Diagnosis not present

## 2022-08-09 DIAGNOSIS — L578 Other skin changes due to chronic exposure to nonionizing radiation: Secondary | ICD-10-CM | POA: Diagnosis not present

## 2022-08-14 DIAGNOSIS — M9905 Segmental and somatic dysfunction of pelvic region: Secondary | ICD-10-CM | POA: Diagnosis not present

## 2022-08-14 DIAGNOSIS — M5136 Other intervertebral disc degeneration, lumbar region: Secondary | ICD-10-CM | POA: Diagnosis not present

## 2022-08-14 DIAGNOSIS — M9904 Segmental and somatic dysfunction of sacral region: Secondary | ICD-10-CM | POA: Diagnosis not present

## 2022-08-14 DIAGNOSIS — M9903 Segmental and somatic dysfunction of lumbar region: Secondary | ICD-10-CM | POA: Diagnosis not present

## 2022-08-31 ENCOUNTER — Other Ambulatory Visit: Payer: Self-pay | Admitting: Medical

## 2022-09-16 IMAGING — MG DIGITAL SCREENING BILAT W/ TOMO W/ CAD
8 series · 8 of 24 positions shown · non-contrast
Comparison: Previous exam(s).

CLINICAL DATA: Screening.

EXAM:
DIGITAL SCREENING BILATERAL MAMMOGRAM WITH TOMO AND CAD

[L MLO synth-2D]
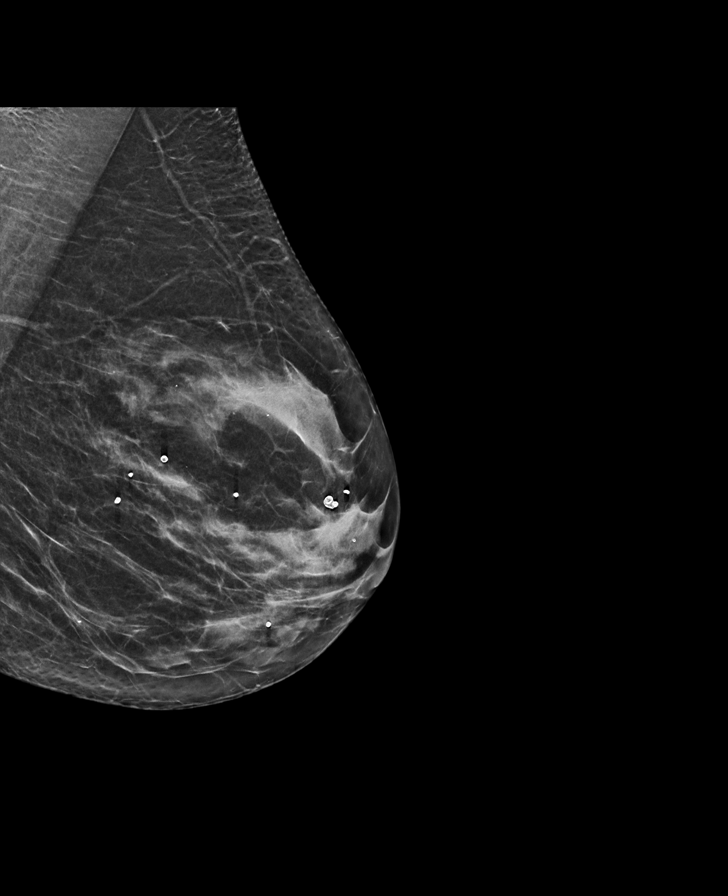

[R CC synth-2D]
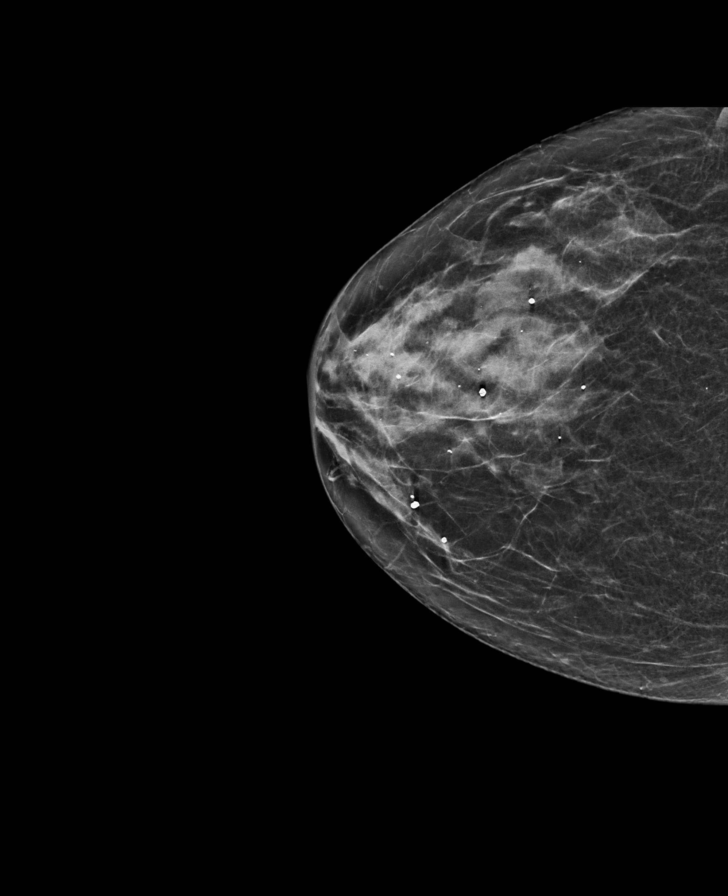

[L CC synth-2D]
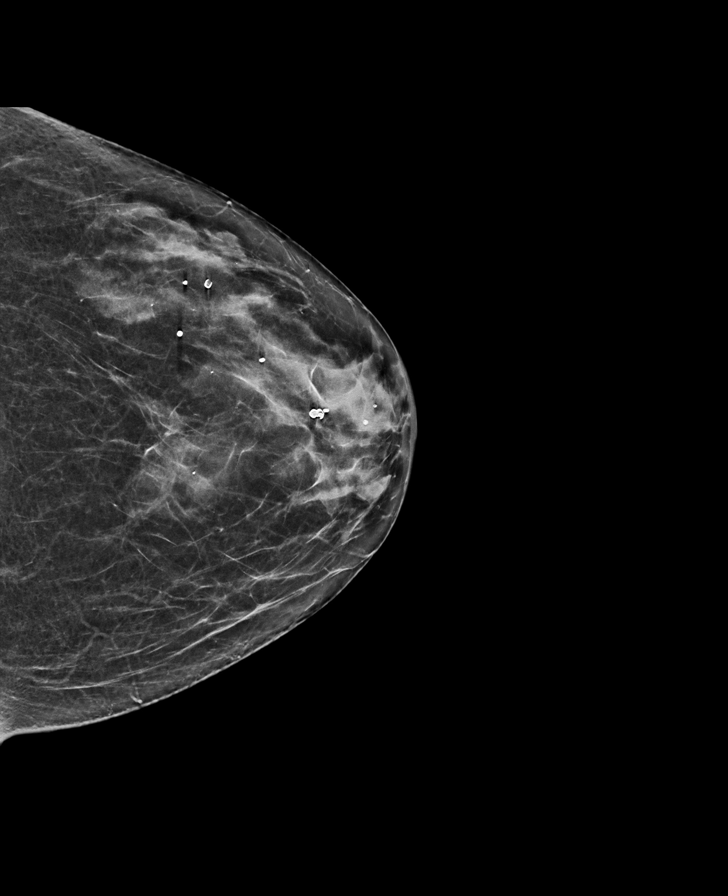

[R MLO synth-2D]
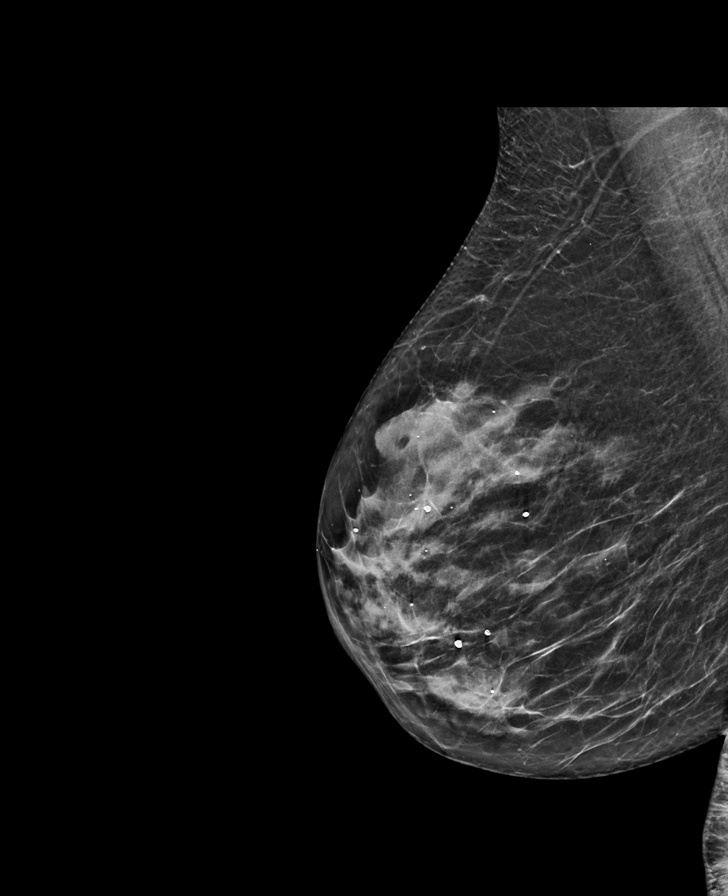

[L CC tomo · tomo slice 32/63.0]
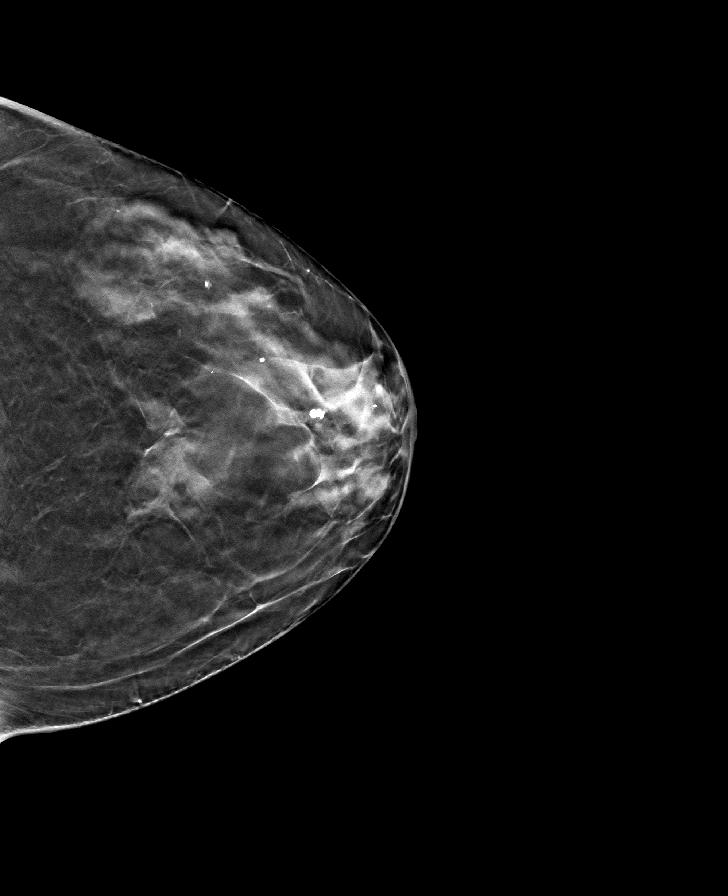

[L MLO tomo · tomo slice 31/62.0]
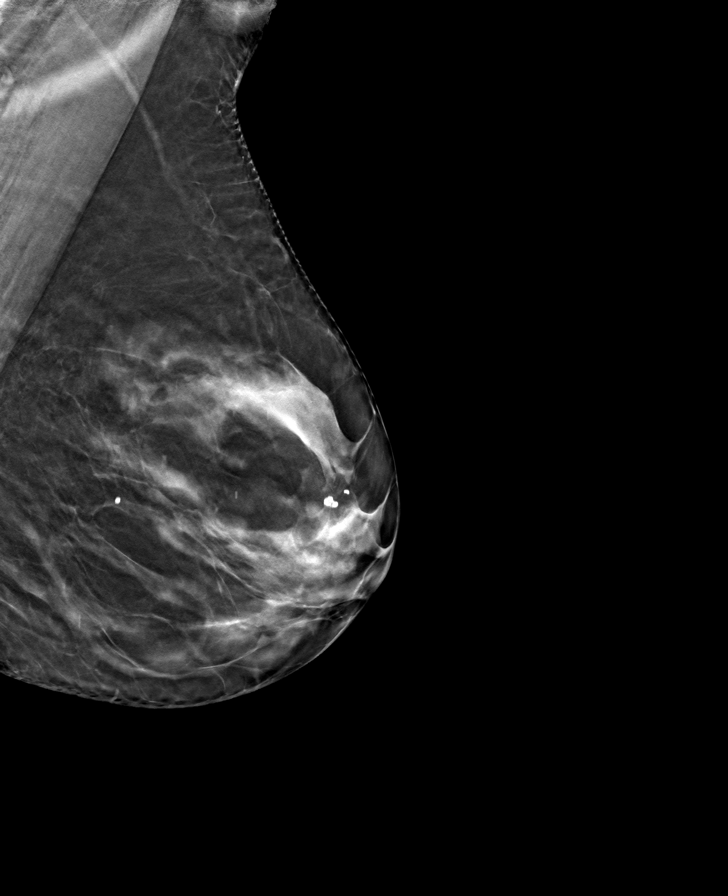

[R CC tomo · tomo slice 31/60.0]
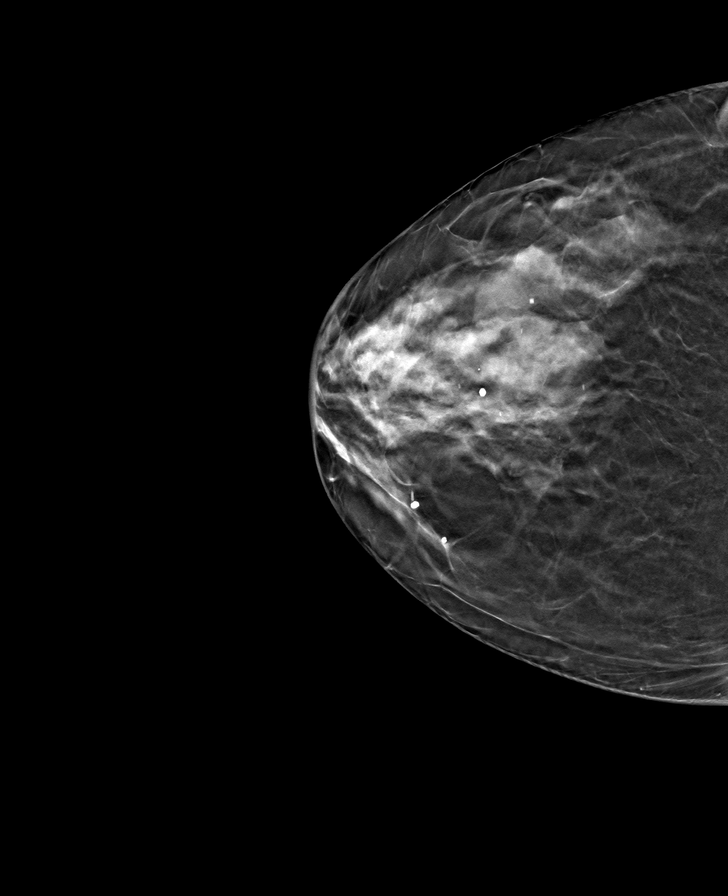

[R MLO tomo · tomo slice 31/62.0]
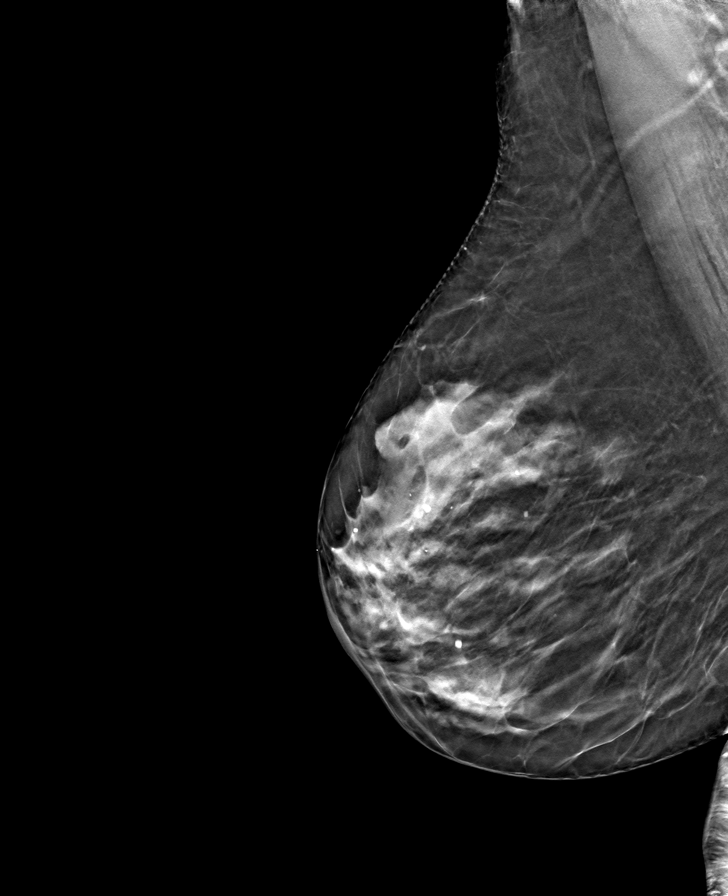

[8 of 24 positions shown; findings below may reference images not displayed]

ACR Breast Density Category c: The breast tissue is heterogeneously
dense, which may obscure small masses.
FINDINGS: There are no findings suspicious for malignancy. Images were
processed with CAD.
IMPRESSION: No mammographic evidence of malignancy. A result letter of this
screening mammogram will be mailed directly to the patient.

RECOMMENDATION:
Screening mammogram in one year. (Code:[id number]-U-LHB)

BI-RADS CATEGORY  1: Negative.

## 2022-09-22 ENCOUNTER — Other Ambulatory Visit: Payer: Self-pay | Admitting: Orthopedic Surgery

## 2022-09-22 ENCOUNTER — Encounter (HOSPITAL_BASED_OUTPATIENT_CLINIC_OR_DEPARTMENT_OTHER): Payer: Self-pay | Admitting: Orthopedic Surgery

## 2022-09-22 ENCOUNTER — Other Ambulatory Visit: Payer: Self-pay

## 2022-09-22 DIAGNOSIS — M65332 Trigger finger, left middle finger: Secondary | ICD-10-CM | POA: Diagnosis not present

## 2022-09-22 DIAGNOSIS — G5602 Carpal tunnel syndrome, left upper limb: Secondary | ICD-10-CM | POA: Diagnosis not present

## 2022-09-25 ENCOUNTER — Telehealth: Payer: Self-pay | Admitting: Medical

## 2022-09-25 ENCOUNTER — Encounter (HOSPITAL_BASED_OUTPATIENT_CLINIC_OR_DEPARTMENT_OTHER)
Admission: RE | Admit: 2022-09-25 | Discharge: 2022-09-25 | Disposition: A | Discharge status: Home / Self Care | Payer: Medicare PPO | Source: Ambulatory Visit | Attending: Orthopedic Surgery | Admitting: Orthopedic Surgery

## 2022-09-25 DIAGNOSIS — Z01812 Encounter for preprocedural laboratory examination: Secondary | ICD-10-CM | POA: Insufficient documentation

## 2022-09-25 DIAGNOSIS — I10 Essential (primary) hypertension: Secondary | ICD-10-CM | POA: Diagnosis not present

## 2022-09-25 LAB — BASIC METABOLIC PANEL
Anion gap: 9 (ref 5–15)
BUN: 17 mg/dL (ref 8–23)
CO2: 27 mmol/L (ref 22–32)
Calcium: 9.3 mg/dL (ref 8.9–10.3)
Chloride: 99 mmol/L (ref 98–111)
Creatinine, Ser: 0.96 mg/dL (ref 0.44–1.00)
GFR, Estimated: >60 mL/min (ref >60)
Glucose, Bld: 72 mg/dL (ref 70–99)
Potassium: 3.9 mmol/L (ref 3.5–5.1)
Sodium: 135 mmol/L (ref 135–145)

## 2022-09-25 NOTE — Telephone Encounter (Signed)
Pt states she has been cutting down on her Lexapro and is now down to 1/2 qd for a while and would like to stop this because she doesn't think she needs it anymore,  can she just stop or does she need to taper to every other day or what do you recommend?

## 2022-09-26 NOTE — Telephone Encounter (Signed)
Left message for pt

## 2022-09-27 NOTE — Anesthesia Preprocedure Evaluation (Addendum)
Anesthesia Evaluation  Patient identified by MRN, date of birth, ID band Patient awake    Reviewed: Allergy & Precautions, NPO status , Patient's Chart, lab work & pertinent test results  History of Anesthesia Complications Negative for: history of anesthetic complications  Airway Mallampati: II  TM Distance: >3 FB Neck ROM: Full    Dental no notable dental hx.    Pulmonary neg pulmonary ROS   Pulmonary exam normal        Cardiovascular hypertension, Pt. on medications Normal cardiovascular exam     Neuro/Psych    GI/Hepatic Neg liver ROS, hiatal hernia,GERD  Medicated,,  Endo/Other  negative endocrine ROS    Renal/GU negative Renal ROS  negative genitourinary   Musculoskeletal  (+) Arthritis ,  LEFT CARPAL TUNNEL SYNDROME, LEFT LONG TRIGGER DIGIT, LEFT LONG FINGER ANNULAR LIGAMENT CYST   Abdominal   Peds  Hematology negative hematology ROS (+)   Anesthesia Other Findings Day of surgery medications reviewed with patient.  Reproductive/Obstetrics                              Anesthesia Physical Anesthesia Plan  ASA: 2  Anesthesia Plan: MAC and Regional   Post-op Pain Management: Tylenol PO (pre-op)*   Induction:   PONV Risk Score and Plan: 2 and Treatment may vary due to age or medical condition, Ondansetron, Propofol infusion and TIVA  Airway Management Planned: Natural Airway and Simple Face Mask  Additional Equipment: None  Intra-op Plan:   Post-operative Plan:   Informed Consent: I have reviewed the patients History and Physical, chart, labs and discussed the procedure including the risks, benefits and alternatives for the proposed anesthesia with the patient or authorized representative who has indicated his/her understanding and acceptance.       Plan Discussed with: CRNA  Anesthesia Plan Comments:         Anesthesia Quick Evaluation

## 2022-09-28 ENCOUNTER — Ambulatory Visit (HOSPITAL_BASED_OUTPATIENT_CLINIC_OR_DEPARTMENT_OTHER): Payer: Medicare PPO | Admitting: Anesthesiology

## 2022-09-28 ENCOUNTER — Encounter (HOSPITAL_BASED_OUTPATIENT_CLINIC_OR_DEPARTMENT_OTHER): Payer: Self-pay | Admitting: Orthopedic Surgery

## 2022-09-28 ENCOUNTER — Encounter (HOSPITAL_BASED_OUTPATIENT_CLINIC_OR_DEPARTMENT_OTHER)
Admission: RE | Disposition: A | Discharge status: Home / Self Care | Payer: Self-pay | Source: Home / Self Care | Attending: Orthopedic Surgery

## 2022-09-28 ENCOUNTER — Other Ambulatory Visit: Payer: Self-pay

## 2022-09-28 ENCOUNTER — Ambulatory Visit (HOSPITAL_BASED_OUTPATIENT_CLINIC_OR_DEPARTMENT_OTHER)
Admission: RE | Admit: 2022-09-28 | Discharge: 2022-09-28 | Disposition: A | Discharge status: Home / Self Care | Payer: Medicare PPO | Attending: Orthopedic Surgery | Admitting: Orthopedic Surgery

## 2022-09-28 DIAGNOSIS — M65332 Trigger finger, left middle finger: Secondary | ICD-10-CM | POA: Insufficient documentation

## 2022-09-28 DIAGNOSIS — K449 Diaphragmatic hernia without obstruction or gangrene: Secondary | ICD-10-CM | POA: Diagnosis not present

## 2022-09-28 DIAGNOSIS — M67442 Ganglion, left hand: Secondary | ICD-10-CM

## 2022-09-28 DIAGNOSIS — G5602 Carpal tunnel syndrome, left upper limb: Secondary | ICD-10-CM | POA: Diagnosis not present

## 2022-09-28 DIAGNOSIS — M65322 Trigger finger, left index finger: Secondary | ICD-10-CM | POA: Diagnosis not present

## 2022-09-28 DIAGNOSIS — I10 Essential (primary) hypertension: Secondary | ICD-10-CM | POA: Diagnosis not present

## 2022-09-28 DIAGNOSIS — K219 Gastro-esophageal reflux disease without esophagitis: Secondary | ICD-10-CM | POA: Insufficient documentation

## 2022-09-28 DIAGNOSIS — M1990 Unspecified osteoarthritis, unspecified site: Secondary | ICD-10-CM | POA: Diagnosis not present

## 2022-09-28 DIAGNOSIS — L729 Follicular cyst of the skin and subcutaneous tissue, unspecified: Secondary | ICD-10-CM | POA: Diagnosis not present

## 2022-09-28 HISTORY — PX: CYST EXCISION: SHX5701

## 2022-09-28 HISTORY — PX: CARPAL TUNNEL RELEASE: SHX101

## 2022-09-28 HISTORY — PX: TRIGGER FINGER RELEASE: SHX641

## 2022-09-28 SURGERY — CARPAL TUNNEL RELEASE
Anesthesia: Monitor Anesthesia Care | Site: Wrist | Laterality: Left

## 2022-09-28 MED ORDER — ACETAMINOPHEN 500 MG PO TABS
1000.0000 mg | ORAL_TABLET | Freq: Once | ORAL | Status: AC
  Administered 2022-09-28: 1000 mg via ORAL

## 2022-09-28 MED ORDER — MIDAZOLAM HCL 2 MG/2ML IJ SOLN
INTRAMUSCULAR | Status: AC
  Filled 2022-09-28: qty 2

## 2022-09-28 MED ORDER — BUPIVACAINE HCL (PF) 0.25 % IJ SOLN
INTRAMUSCULAR | Status: AC
  Filled 2022-09-28: qty 30

## 2022-09-28 MED ORDER — BUPIVACAINE HCL (PF) 0.25 % IJ SOLN
INTRAMUSCULAR | Status: DC | PRN
  Administered 2022-09-28: 9 mL

## 2022-09-28 MED ORDER — FENTANYL CITRATE (PF) 100 MCG/2ML IJ SOLN
INTRAMUSCULAR | Status: AC
  Filled 2022-09-28: qty 2

## 2022-09-28 MED ORDER — CLONIDINE HCL (ANALGESIA) 100 MCG/ML EP SOLN
EPIDURAL | Status: DC | PRN
  Administered 2022-09-28: 100 ug

## 2022-09-28 MED ORDER — ONDANSETRON HCL 4 MG/2ML IJ SOLN
INTRAMUSCULAR | Status: DC | PRN
  Administered 2022-09-28: 4 mg via INTRAVENOUS

## 2022-09-28 MED ORDER — BUPIVACAINE-EPINEPHRINE (PF) 0.5% -1:200000 IJ SOLN
INTRAMUSCULAR | Status: DC | PRN
  Administered 2022-09-28: 30 mL via PERINEURAL

## 2022-09-28 MED ORDER — LACTATED RINGERS IV SOLN: INTRAVENOUS | Status: DC

## 2022-09-28 MED ORDER — CEFAZOLIN SODIUM-DEXTROSE 2-4 GM/100ML-% IV SOLN
INTRAVENOUS | Status: AC
  Filled 2022-09-28: qty 100

## 2022-09-28 MED ORDER — FENTANYL CITRATE (PF) 100 MCG/2ML IJ SOLN: 25.0000 ug | INTRAMUSCULAR | Status: DC | PRN

## 2022-09-28 MED ORDER — FENTANYL CITRATE (PF) 100 MCG/2ML IJ SOLN
INTRAMUSCULAR | Status: DC | PRN
  Administered 2022-09-28: 25 ug via INTRAVENOUS

## 2022-09-28 MED ORDER — ACETAMINOPHEN 500 MG PO TABS
ORAL_TABLET | ORAL | Status: AC
  Filled 2022-09-28: qty 2

## 2022-09-28 MED ORDER — PROPOFOL 500 MG/50ML IV EMUL
INTRAVENOUS | Status: DC | PRN
  Administered 2022-09-28: 75 ug/kg/min via INTRAVENOUS

## 2022-09-28 MED ORDER — LIDOCAINE 2% (20 MG/ML) 5 ML SYRINGE
INTRAMUSCULAR | Status: DC | PRN
  Administered 2022-09-28: 10 mg via INTRAVENOUS

## 2022-09-28 MED ORDER — FENTANYL CITRATE (PF) 100 MCG/2ML IJ SOLN
100.0000 ug | Freq: Once | INTRAMUSCULAR | Status: AC
  Administered 2022-09-28: 100 ug via INTRAVENOUS

## 2022-09-28 MED ORDER — CEFAZOLIN SODIUM-DEXTROSE 2-4 GM/100ML-% IV SOLN
2.0000 g | INTRAVENOUS | Status: AC
  Administered 2022-09-28: 2 g via INTRAVENOUS

## 2022-09-28 SURGICAL SUPPLY — 59 items
APL PRP STRL LF DISP 70% ISPRP (MISCELLANEOUS) ×2
APL SKNCLS STERI-STRIP NONHPOA (GAUZE/BANDAGES/DRESSINGS)
BANDAGE GAUZE 1X75IN STRL (MISCELLANEOUS) IMPLANT
BENZOIN TINCTURE PRP APPL 2/3 (GAUZE/BANDAGES/DRESSINGS) IMPLANT
BLADE MINI RND TIP GREEN BEAV (BLADE) IMPLANT
BLADE SURG 15 STRL LF DISP TIS (BLADE) ×4 IMPLANT
BLADE SURG 15 STRL SS (BLADE) ×4
BNDG CMPR 5X2 CHSV 1 LYR STRL (GAUZE/BANDAGES/DRESSINGS) ×2
BNDG CMPR 5X2 KNTD ELC UNQ LF (GAUZE/BANDAGES/DRESSINGS)
BNDG CMPR 5X3 KNIT ELC UNQ LF (GAUZE/BANDAGES/DRESSINGS) ×2
BNDG CMPR 75X11 PLY HI ABS (MISCELLANEOUS)
BNDG CMPR 75X21 PLY HI ABS (MISCELLANEOUS)
BNDG CMPR 9X4 STRL LF SNTH (GAUZE/BANDAGES/DRESSINGS)
BNDG COHESIVE 1X5 TAN STRL LF (GAUZE/BANDAGES/DRESSINGS) IMPLANT
BNDG COHESIVE 2X5 TAN ST LF (GAUZE/BANDAGES/DRESSINGS) ×2 IMPLANT
BNDG ELASTIC 2INX 5YD STR LF (GAUZE/BANDAGES/DRESSINGS) IMPLANT
BNDG ELASTIC 3INX 5YD STR LF (GAUZE/BANDAGES/DRESSINGS) ×2 IMPLANT
BNDG ESMARK 4X9 LF (GAUZE/BANDAGES/DRESSINGS) IMPLANT
BNDG GAUZE 1X75IN STRL (MISCELLANEOUS)
BNDG GAUZE DERMACEA FLUFF 4 (GAUZE/BANDAGES/DRESSINGS) ×2 IMPLANT
BNDG GZE DERMACEA 4 6PLY (GAUZE/BANDAGES/DRESSINGS) ×2
BNDG PLASTER X FAST 3X3 WHT LF (CAST SUPPLIES) IMPLANT
BNDG PLSTR 9X3 FST ST WHT (CAST SUPPLIES)
CHLORAPREP W/TINT 26 (MISCELLANEOUS) ×2 IMPLANT
CORD BIPOLAR FORCEPS 12FT (ELECTRODE) ×2 IMPLANT
COVER BACK TABLE 60X90IN (DRAPES) ×2 IMPLANT
COVER MAYO STAND STRL (DRAPES) ×2 IMPLANT
CUFF TOURN SGL QUICK 18X4 (TOURNIQUET CUFF) ×2 IMPLANT
DRAPE EXTREMITY T 121X128X90 (DISPOSABLE) ×2 IMPLANT
DRAPE SURG 17X23 STRL (DRAPES) ×2 IMPLANT
GAUZE PAD ABD 8X10 STRL (GAUZE/BANDAGES/DRESSINGS) ×2 IMPLANT
GAUZE SPONGE 4X4 12PLY STRL (GAUZE/BANDAGES/DRESSINGS) ×2 IMPLANT
GAUZE STRETCH 2X75IN STRL (MISCELLANEOUS) IMPLANT
GAUZE XEROFORM 1X8 LF (GAUZE/BANDAGES/DRESSINGS) ×2 IMPLANT
GLOVE BIO SURGEON STRL SZ7.5 (GLOVE) ×2 IMPLANT
GLOVE BIOGEL PI IND STRL 7.5 (GLOVE) IMPLANT
GLOVE BIOGEL PI IND STRL 8 (GLOVE) ×2 IMPLANT
GLOVE SURG SS PI 7.0 STRL IVOR (GLOVE) IMPLANT
GOWN STRL REUS W/ TWL LRG LVL3 (GOWN DISPOSABLE) ×2 IMPLANT
GOWN STRL REUS W/TWL LRG LVL3 (GOWN DISPOSABLE) ×2
GOWN STRL REUS W/TWL XL LVL3 (GOWN DISPOSABLE) ×2 IMPLANT
NDL HYPO 25X1 1.5 SAFETY (NEEDLE) ×2 IMPLANT
NEEDLE HYPO 25X1 1.5 SAFETY (NEEDLE) ×2 IMPLANT
NS IRRIG 1000ML POUR BTL (IV SOLUTION) ×2 IMPLANT
PACK BASIN DAY SURGERY FS (CUSTOM PROCEDURE TRAY) ×2 IMPLANT
PAD CAST 3X4 CTTN HI CHSV (CAST SUPPLIES) IMPLANT
PAD CAST 4YDX4 CTTN HI CHSV (CAST SUPPLIES) IMPLANT
PADDING CAST ABS COTTON 4X4 ST (CAST SUPPLIES) ×2 IMPLANT
PADDING CAST COTTON 3X4 STRL (CAST SUPPLIES)
PADDING CAST COTTON 4X4 STRL (CAST SUPPLIES)
SLING ARM FOAM STRAP LRG (SOFTGOODS) IMPLANT
STOCKINETTE 4X48 STRL (DRAPES) ×2 IMPLANT
STRIP CLOSURE SKIN 1/2X4 (GAUZE/BANDAGES/DRESSINGS) IMPLANT
SUT ETHILON 3 0 PS 1 (SUTURE) IMPLANT
SUT ETHILON 4 0 PS 2 18 (SUTURE) ×2 IMPLANT
SYR BULB EAR ULCER 3OZ GRN STR (SYRINGE) ×2 IMPLANT
SYR CONTROL 10ML LL (SYRINGE) ×2 IMPLANT
TOWEL GREEN STERILE FF (TOWEL DISPOSABLE) ×4 IMPLANT
UNDERPAD 30X36 HEAVY ABSORB (UNDERPADS AND DIAPERS) ×2 IMPLANT

## 2022-09-28 NOTE — Op Note (Addendum)
09/28/2022 [facility]                              OPERATIVE REPORT   PREOPERATIVE DIAGNOSIS:   Left carpal tunnel syndrome Left long finger trigger digit Left long finger annular ligament cyst  POSTOPERATIVE DIAGNOSIS:   Left carpal tunnel syndrome Left long finger trigger digit Left long finger annular ligament cyst  PROCEDURE:   Left carpal tunnel release Left long finger trigger release Left long finger excision annular ligament cyst  SURGEON:  [name], MD  ASSISTANT:  none.  ANESTHESIA: Regional with sedation  IV FLUIDS:  Per anesthesia flow sheet.  ESTIMATED BLOOD LOSS:  Minimal.  COMPLICATIONS:  None.  SPECIMENS:  long finger annular ligament cyst to pathology  TOURNIQUET TIME:    Total Tourniquet Time Documented: Upper Arm (Left) - 20 minutes Total: Upper Arm (Left) - 20 minutes   DISPOSITION:  Stable to PACU.  LOCATION: [facility]  INDICATIONS:  77 y.o. yo female with numbness and tingling left hand.  Nocturnal symptoms. Positive nerve conduction studies. She has left long finger trigger digit and annular ligament cyst that are bothersome.  She wishes to have left carpal tunnel release, long finger trigger release, long finger excision annular ligament cyst.  Risks, benefits and alternatives of surgery were discussed including the risk of blood loss; infection; damage to nerves, vessels, tendons, ligaments, bone; failure of surgery; need for additional surgery; complications with wound healing; continued pain; recurrence of carpal tunnel syndrome; and damage to motor branch. She voiced understanding of these risks and elected to proceed.   OPERATIVE COURSE:  After being identified preoperatively by myself, the patient and I agreed upon the procedure and site of procedure.  The surgical site was marked.  Surgical consent had been signed.  She was given IV Ancef as preoperative antibiotic prophylaxis.  She was transferred to the  operating room and placed on the operating room table in supine position with the left upper extremity on an armboard.  Sedation was induced by the anesthesiologist. and A regional block had been performed by anesthesia in preoperative holding.    Left upper extremity was prepped and draped in normal sterile orthopaedic fashion.  A surgical pause was performed between the surgeons, anesthesia, and operating room staff, and all were in agreement as to the patient, procedure, and site of procedure.  Tourniquet at the proximal aspect of the extremity was inflated to 250 mmHg after exsanguination of the arm with an Esmarch bandage  Incision was made over the transverse carpal ligament and carried into the subcutaneous tissues by spreading technique.  Bipolar electrocautery was used to obtain hemostasis.  The palmar fascia was sharply incised.  The transverse carpal ligament was identified.  The fascia distal to the ligament was opened.  Retractor was placed and the flexor tendons were identified.  The flexor tendon to the ring finger was identified and retracted radially.  The transverse carpal ligament was then incised from distal to proximal under direct visualization.  Scissors were used to split the distal aspect of the volar antebrachial fascia.  A finger was placed into the wound to ensure complete decompression, which was the case.  The nerve was examined.  It was adherent to the radial leaflet.  The motor branch was identified and was intact.  The wound was copiously irrigated with sterile saline.  It was then closed with 4-0 nylon in a  horizontal mattress fashion.  An incision was then made at the volar aspect of the MP joint of the long finger.  This was carried into the subcutaneous tissues by spreading technique.  Bipolar electrocautery was used to obtain hemostasis.  The radial and ulnar digital nerves were protected throughout the case. The flexor sheath was identified.  There was a cyst coming from the  sheath at the proximal aspect of the A1 pulley.  This was removed and sent to pathology for examination.  The A1 pulley was then sharply incised.  It was released in its entirety.  The proximal 1-2 mm of the A2 pulley was vented to allow better excursion of the tendons.  The finger was placed through a range of motion and there was noted to be no catching.  The tendons were brought through the wound and any adherences released.  The wound was then copiously irrigated with sterile saline. It was closed with 4-0 nylon in a horizontal mattress fashion.  The wounds were injected with 0.25% plain Marcaine to aid in postoperative analgesia.  They were dressed with sterile Xeroform, 4x4s, an ABD, and wrapped with Kerlix and an Ace bandage.  Tourniquet was deflated at 20 minutes.  Fingertips were pink with brisk capillary refill after deflation of the tourniquet.  Operative drapes were broken down.  The patient was awoken from anesthesia safely.  She was transferred back to stretcher and taken to the PACU in stable condition.  I will see her back in the office in 1 week for postoperative followup.  She states she has tramadol left over from her previous surgery.    [name], MD Electronically signed, 09/28/22

## 2022-09-28 NOTE — Transfer of Care (Signed)
Immediate Anesthesia Transfer of Care Note  Patient: Deborah Gardner  Procedure(s) Performed: LEFT CARPAL TUNNEL RELEASE (Left: Wrist) RELEASE TRIGGER FINGER/A-1 PULLEY LEFT LONG FINGER (Left: Hand) LEFT LONG FINGER EXCISION ANNULAR LIGAMENT CYST (Left: Hand)  Patient Location: PACU  Anesthesia Type:MAC combined with regional for post-op pain  Level of Consciousness: sedated  Airway & Oxygen Therapy: Patient Spontanous Breathing and Patient connected to face mask oxygen  Post-op Assessment: Report given to RN and Post -op Vital signs reviewed and stable  Post vital signs: Reviewed and stable  Last Vitals:  Vitals Value Taken Time  BP    Temp    Pulse    Resp    SpO2      Last Pain:  Vitals:   09/28/22 0810  TempSrc: Oral  PainSc: 0-No pain         Complications: No notable events documented.

## 2022-09-28 NOTE — H&P (Signed)
Deborah Gardner is an 77 y.o. female.   Chief Complaint: carpal tunnel, trigger digit HPI: 77 y.o. yo female with numbness and tingling left hand.  Nocturnal symptoms. Positive nerve conduction studies. Also with left long finger trigger digit and annular ligament cyst.  She wishes to have left carpal tunnel release, left long trigger release, and excision of annular ligament cyst.   Allergies:  Allergies  Allergen Reactions   Atorvastatin Other (See Comments)    Memory loss   Lotrel [Amlodipine Besy-Benazepril Hcl] Hives    Most likely from the benazepril    Molnupiravir     Sick feeling   Sulfa Antibiotics     Lip swelling   Amlodipine Besylate Rash    Past Medical History:  Diagnosis Date   Allergic rhinitis    Allergy    Arthritis    Cataract    bil   Colon polyps    Esophagitis    GERD (gastroesophageal reflux disease)    HH (hiatus hernia)    HTN (hypertension)    Hyperlipidemia    Increased pressure in the eye    OA (osteoarthritis)    hands   Ocular tension increased    at risk for glaucoma   Osteoporosis    Rectal pain     Past Surgical History:  Procedure Laterality Date   ABDOMINAL HYSTERECTOMY     still has ovaries   APPENDECTOMY     BREAST CYST ASPIRATION  07/2001   CARPAL TUNNEL RELEASE Right 05/29/2022   Procedure: RIGHT CARPAL TUNNEL RELEASE;  Surgeon: [name], MD;  Location: [facility];  Service: Orthopedics;  Laterality: Right;  Bier block   CATARACT EXTRACTION  03/2006   ESOPHAGOGASTRODUODENOSCOPY  05/2007   HEMORRHOID SURGERY     KNEE ARTHROSCOPY WITH MENISCAL REPAIR  2021   rectal sphincterotomy     Retinal laser surgery     UPPER GASTROINTESTINAL ENDOSCOPY      Family History: Family History  Problem Relation Age of Onset   Colon cancer Father    Pneumonia Father    Thyroid disease Mother    Depression Mother    Hypertension Mother    Osteoporosis Mother    Liver disease Brother        from alcohol   Breast  cancer Paternal Aunt    Alcohol abuse Brother        terminal    Alcohol abuse Brother    Thyroid disease Other        half sister   Colon cancer Other        Aunt   Breast cancer Maternal Aunt    Breast cancer Paternal Aunt    Colon polyps Neg Hx    Esophageal cancer Neg Hx    Stomach cancer Neg Hx    Rectal cancer Neg Hx     Social History:   reports that she has never smoked. She has never used smokeless tobacco. She reports that she does not drink alcohol and does not use drugs.  Medications: Medications Prior to Admission  Medication Sig Dispense Refill   Calcium Carb-Cholecalciferol (CALCIUM + VITAMIN D3 PO) Take by mouth.     escitalopram (LEXAPRO) 10 MG tablet Take 1 tablet (10 mg total) by mouth daily. 90 tablet 1   fluticasone (FLONASE) 50 MCG/ACT nasal spray SPRAY 2 SPRAYS INTO EACH NOSTRIL EVERY DAY AS NEEDED 48 mL 1   hydrochlorothiazide (HYDRODIURIL) 25 MG tablet Take 1 tablet (25 mg total) by  mouth daily. 90 tablet 2   Ibuprofen 200 MG CAPS Take by mouth.     loratadine (CLARITIN) 10 MG tablet TAKE 1 TABLET BY MOUTH EVERY DAY 90 tablet 0   omeprazole (PRILOSEC) 20 MG capsule TAKE 1 CAPSULE BY MOUTH DAILY BEFORE A MEAL 90 capsule 1   rosuvastatin (CRESTOR) 10 MG tablet Take 1 tablet (10 mg total) by mouth daily. 90 tablet 3   timolol (TIMOPTIC) 0.5 % ophthalmic solution Place 1 drop into both eyes 2 (two) times daily.     acetaminophen (TYLENOL) 325 MG tablet Take 650 mg by mouth every 6 (six) hours as needed.     celecoxib (CELEBREX) 200 MG capsule Take by mouth. Uses prn     traMADol (ULTRAM) 50 MG tablet 1 tab PO q6 hours prn pain 20 tablet 0   Zinc Acetate, Oral, (ZINC ACETATE PO) Take by mouth.      No results found for this or any previous visit (from the past 48 hour(s)).  No results found.    Blood pressure (!) 174/76, pulse 61, temperature 98.2 F (36.8 C), temperature source Oral, resp. rate 11, height 5\' 1"  (1.549 m), weight 70.1 kg, SpO2 100  %.  General appearance: alert, cooperative, and appears stated age Head: Normocephalic, without obvious abnormality, atraumatic Neck: supple, symmetrical, trachea midline Extremities: Intact sensation and capillary refill all digits.  +epl/fpl/io.  No wounds.  Pulses: 2+ and symmetric Skin: Skin color, texture, turgor normal. No rashes or lesions Neurologic: Grossly normal Incision/Wound: none  Assessment/Plan Left carpal tunnel syndrome, long finger trigger digit and annular ligament cyst.  Non operative and operative treatment options have been discussed with the patient and patient wishes to proceed with operative treatment. Risks, benefits, and alternatives of surgery have been discussed and the patient agrees with the plan of care.   [name] 09/28/2022, 9:20 AM

## 2022-09-28 NOTE — Progress Notes (Signed)
Assisted Dr. Howze with left, supraclavicular, ultrasound guided block. Side rails up, monitors on throughout procedure. See vital signs in flow sheet. Tolerated Procedure well. ?

## 2022-09-28 NOTE — Anesthesia Postprocedure Evaluation (Signed)
Anesthesia Post Note  Patient: Deborah Gardner  Procedure(s) Performed: LEFT CARPAL TUNNEL RELEASE (Left: Wrist) RELEASE TRIGGER FINGER/A-1 PULLEY LEFT LONG FINGER (Left: Hand) LEFT LONG FINGER EXCISION ANNULAR LIGAMENT CYST (Left: Hand)     Patient location during evaluation: PACU Anesthesia Type: Regional Level of consciousness: awake and alert Pain management: pain level controlled Vital Signs Assessment: post-procedure vital signs reviewed and stable Respiratory status: spontaneous breathing, nonlabored ventilation and respiratory function stable Cardiovascular status: blood pressure returned to baseline Postop Assessment: no apparent nausea or vomiting Anesthetic complications: no   No notable events documented.  Last Vitals:  Vitals:   09/28/22 1030 09/28/22 1045  BP: (!) 145/69 120/69  Pulse: 64 62  Resp: 11 10  Temp:    SpO2: 98% 92%    Last Pain:  Vitals:   09/28/22 1030  TempSrc:   PainSc: 0-No pain                 Marthenia Rolling

## 2022-09-28 NOTE — Discharge Instructions (Addendum)
[facility] Instructions Hand Surgery  Wound Care: Keep your hand elevated above the level of your heart.  Do not allow it to dangle by your side.  Keep the dressing dry and do not remove it unless your doctor advises you to do so.  He will usually change it at the time of your post-op visit.  Moving your fingers is advised to stimulate circulation but will depend on the site of your surgery.  If you have a splint applied, your doctor will advise you regarding movement.  Activity: Do not drive or operate machinery today.  Rest today and then you may return to your normal activity and work as indicated by your physician.  Diet:  Drink liquids today or eat a light diet.  You may resume a regular diet tomorrow.    General expectations: Pain for two to three days. Fingers may become slightly swollen.  Call your doctor if any of the following occur: Severe pain not relieved by pain medication. Elevated temperature. Dressing soaked with blood. Inability to move fingers. White or bluish color to fingers.  Post Anesthesia Home Care Instructions  Activity: Get plenty of rest for the remainder of the day. A responsible individual must stay with you for 24 hours following the procedure.  For the next 24 hours, DO NOT: -Drive a car -Paediatric nurse -Drink alcoholic beverages -Take any medication unless instructed by your physician -Make any legal decisions or sign important papers.  Meals: Start with liquid foods such as gelatin or soup. Progress to regular foods as tolerated. Avoid greasy, spicy, heavy foods. If nausea and/or vomiting occur, drink only clear liquids until the nausea and/or vomiting subsides. Call your physician if vomiting continues.  Special Instructions/Symptoms: Your throat may feel dry or sore from the anesthesia or the breathing tube placed in your throat during surgery. If this causes discomfort, gargle with warm salt water. The discomfort should disappear within  24 hours.  Regional Anesthesia Blocks  1. Numbness or the inability to move the "blocked" extremity may last from 3-48 hours after placement. The length of time depends on the medication injected and your individual response to the medication. If the numbness is not going away after 48 hours, call your surgeon.  2. The extremity that is blocked will need to be protected until the numbness is gone and the  Strength has returned. Because you cannot feel it, you will need to take extra care to avoid injury. Because it may be weak, you may have difficulty moving it or using it. You may not know what position it is in without looking at it while the block is in effect.  3. For blocks in the legs and feet, returning to weight bearing and walking needs to be done carefully. You will need to wait until the numbness is entirely gone and the strength has returned. You should be able to move your leg and foot normally before you try and bear weight or walk. You will need someone to be with you when you first try to ensure you do not fall and possibly risk injury.  4. Bruising and tenderness at the needle site are common side effects and will resolve in a few days.  5. Persistent numbness or new problems with movement should be communicated to the surgeon or the [facility] [phone] [place] [phone]).  Tylenol can be taken at 2:13 pm if needed

## 2022-09-28 NOTE — Anesthesia Procedure Notes (Signed)
Anesthesia Regional Block: Supraclavicular block   Pre-Anesthetic Checklist: , timeout performed,  Correct Patient, Correct Site, Correct Laterality,  Correct Procedure, Correct Position, site marked,  Risks and benefits discussed,  Pre-op evaluation,  At surgeon's request and post-op pain management  Laterality: Left  Prep: Maximum Sterile Barrier Precautions used, chloraprep       Needles:  Injection technique: Single-shot  Needle Type: Echogenic Stimulator Needle     Needle Length: 9cm  Needle Gauge: 22     Additional Needles:   Procedures:,,,, ultrasound used (permanent image in chart),,    Narrative:  Start time: 09/28/2022 9:02 AM End time: 09/28/2022 9:05 AM Injection made incrementally with aspirations every 5 mL.  Performed by: Personally  Anesthesiologist: Brennan Bailey, MD  Additional Notes: Risks, benefits, and alternative discussed. Patient gave consent for procedure. Patient prepped and draped in sterile fashion. Sedation administered, patient remains easily responsive to voice. Relevant anatomy identified with ultrasound guidance. Local anesthetic given in 5cc increments with no signs or symptoms of intravascular injection. No pain or paraesthesias with injection. Patient monitored throughout procedure with signs of LAST or immediate complications. Tolerated well. Ultrasound image placed in chart.  Tawny Asal, MD

## 2022-09-29 ENCOUNTER — Encounter (HOSPITAL_BASED_OUTPATIENT_CLINIC_OR_DEPARTMENT_OTHER): Payer: Self-pay | Admitting: Orthopedic Surgery

## 2022-09-29 LAB — SURGICAL PATHOLOGY

## 2022-10-10 ENCOUNTER — Other Ambulatory Visit: Payer: Self-pay | Admitting: Family Medicine

## 2022-10-11 ENCOUNTER — Other Ambulatory Visit: Payer: Self-pay | Admitting: Medical

## 2022-10-23 DIAGNOSIS — L821 Other seborrheic keratosis: Secondary | ICD-10-CM | POA: Diagnosis not present

## 2022-10-23 DIAGNOSIS — D225 Melanocytic nevi of trunk: Secondary | ICD-10-CM | POA: Diagnosis not present

## 2022-10-23 DIAGNOSIS — L570 Actinic keratosis: Secondary | ICD-10-CM | POA: Diagnosis not present

## 2022-10-23 DIAGNOSIS — L814 Other melanin hyperpigmentation: Secondary | ICD-10-CM | POA: Diagnosis not present

## 2022-10-25 ENCOUNTER — Ambulatory Visit (INDEPENDENT_AMBULATORY_CARE_PROVIDER_SITE_OTHER): Payer: Medicare PPO | Admitting: Medical

## 2022-10-25 VITALS — BP 126/80 | HR 64 | Ht 61.0 in | Wt 151.8 lb

## 2022-10-25 DIAGNOSIS — R110 Nausea: Secondary | ICD-10-CM

## 2022-10-25 DIAGNOSIS — Z7185 Encounter for immunization safety counseling: Secondary | ICD-10-CM | POA: Diagnosis not present

## 2022-10-25 DIAGNOSIS — E7800 Pure hypercholesterolemia, unspecified: Secondary | ICD-10-CM

## 2022-10-25 DIAGNOSIS — Z2821 Immunization not carried out because of patient refusal: Secondary | ICD-10-CM

## 2022-10-25 DIAGNOSIS — Z0000 Encounter for general adult medical examination without abnormal findings: Secondary | ICD-10-CM

## 2022-10-25 DIAGNOSIS — R531 Weakness: Secondary | ICD-10-CM

## 2022-10-25 DIAGNOSIS — N3281 Overactive bladder: Secondary | ICD-10-CM

## 2022-10-25 DIAGNOSIS — R5383 Other fatigue: Secondary | ICD-10-CM | POA: Diagnosis not present

## 2022-10-25 DIAGNOSIS — G479 Sleep disorder, unspecified: Secondary | ICD-10-CM

## 2022-10-25 DIAGNOSIS — R109 Unspecified abdominal pain: Secondary | ICD-10-CM | POA: Diagnosis not present

## 2022-10-25 DIAGNOSIS — Z1211 Encounter for screening for malignant neoplasm of colon: Secondary | ICD-10-CM | POA: Diagnosis not present

## 2022-10-25 DIAGNOSIS — K635 Polyp of colon: Secondary | ICD-10-CM | POA: Diagnosis not present

## 2022-10-25 LAB — POCT URINALYSIS DIP (PROADVANTAGE DEVICE)
Bilirubin, UA: NEGATIVE
Blood, UA: NEGATIVE
Glucose, UA: NEGATIVE mg/dL
Ketones, POC UA: NEGATIVE mg/dL
Leukocytes, UA: NEGATIVE
Nitrite, UA: NEGATIVE
Protein Ur, POC: NEGATIVE mg/dL
Specific Gravity, Urine: 1.01
Urobilinogen, Ur: NEGATIVE
pH, UA: 6.5 (ref 5.0–8.0)

## 2022-10-25 NOTE — Patient Instructions (Signed)
This visit was a preventative care visit, also known as wellness visit or routine physical.   Topics typically include healthy lifestyle, diet, exercise, preventative care, vaccinations, sick and well care, proper use of emergency dept and after hours care, as well as other concerns.    Separate significant issues discussed: Weakness, fatigue-labs today to further evaluate  Ongoing abdominal discomfort, nausea-referral back to gastroenterology  Hyperlipidemia-updated labs today, continue statin but consider 2-week trial off statin to see if that improves his joint pains  Hypertension-continue current medication  Arthritis -consider various options for treatment including topical creams, other pain medications.  She has not seen any improvement at all with Tylenol.    General Recommendations: Continue to return yearly for your annual wellness and preventative care visits.  This gives [place] a chance to discuss healthy lifestyle, exercise, vaccinations, review your chart record, and perform screenings where appropriate.  I recommend you see your eye doctor yearly for routine vision care.  I recommend you see your dentist yearly for routine dental care including hygiene visits twice yearly.   Vaccination recommendations were reviewed Immunization History  Administered Date(s) Administered   Fluad Quad(high Dose 65+) 03/18/2020   Influenza Split 03/11/2011, 03/28/2012, 04/10/2016   Influenza Whole 05/13/2010   Influenza, High Dose Seasonal PF 05/01/2013, 05/18/2014, 09/10/2015, 04/10/2016, 04/12/2017, 05/07/2018, 04/02/2019, 04/20/2021   PFIZER(Purple Top)SARS-COV-2 Vaccination 09/22/2019, 10/13/2019   Pneumococcal Conjugate-13 12/02/2014   Pneumococcal Polysaccharide-23 03/14/2011   Td 08/30/2004   Zoster, Live 11/24/2009    Vaccine recommendations: Shingles Tetanus Prevnar 20 pneumococcal  Vaccines administered today: None, declined pneumococcal vaccine   Screening for  cancer: Colon cancer screening: We will refer you for back to GI  Breast cancer screening: You should perform a self breast exam monthly.   We reviewed recommendations for regular mammograms and breast cancer screening.   Skin cancer screening: Check your skin regularly for new changes, growing lesions, or other lesions of concern Come in for evaluation if you have skin lesions of concern.  Lung cancer screening: If you have a greater than 20 pack year history of tobacco use, then you may qualify for lung cancer screening with a chest CT scan.   Please call your insurance company to inquire about coverage for this test.  Pancreatic cancer: no current screening test is available routinely recommended.  (Risk factors: Smoking, overweight or obese, diabetes, chronic pancreatitis, work Nurse, mental health, Solicitor, 60 year old or greater, female greater than female, African-American, family history of pancreatic cancer, hereditary breast, ovarian, melanoma, Lynch, Peutz-jeghers).  We currently don't have screenings for other cancers besides breast, cervical, colon, and lung cancers.  If you have a strong family history of cancer or have other cancer screening concerns, please let me know.    Bone health: Get at least 150 minutes of aerobic exercise weekly Get weight bearing exercise at least once weekly Bone density test:  A bone density test is an imaging test that uses a type of X-ray to measure the amount of calcium and other minerals in your bones. The test may be used to diagnose or screen you for a condition that causes weak or thin bones (osteoporosis), predict your risk for a broken bone (fracture), or determine how well your osteoporosis treatment is working. The bone density test is recommended for females 65 and older, or females or males <65 if certain risk factors such as thyroid disease, long term use of steroids such as for asthma or rheumatological issues, vitamin D  deficiency, estrogen deficiency, family history  of osteoporosis, self or family history of fragility fracture in first degree relative.  03/2022 osteopenia on bone density test    Heart health: Get at least 150 minutes of aerobic exercise weekly Limit alcohol It is important to maintain a healthy blood pressure and healthy cholesterol numbers  Heart disease screening: Screening for heart disease includes screening for blood pressure, fasting lipids, glucose/diabetes screening, BMI height to weight ratio, reviewed of smoking status, physical activity, and diet.    Goals include blood pressure 120/80 or less, maintaining a healthy lipid/cholesterol profile, preventing diabetes or keeping diabetes numbers under good control, not smoking or using tobacco products, exercising most days per week or at least 150 minutes per week of exercise, and eating healthy variety of fruits and vegetables, healthy oils, and avoiding unhealthy food choices like fried food, fast food, high sugar and high cholesterol foods.    Other tests may possibly include EKG test, CT coronary calcium score, echocardiogram, exercise treadmill stress test.     Vascular disease screening: For high risk individuals including smokers, diabetes, patients with known heart disease or high blood pressure, kidney disease, and others, screening for vascular disease or atherosclerosis of the arteries is available.  Examples may include carotid ultrasound, abdominal aortic ultrasound, ABI blood flow screening in the legs, thoracic aorta screening.    Medical care options: I recommend you continue to seek care here first for routine care.  We try really hard to have available appointments Monday through Friday daytime hours for sick visits, acute visits, and physicals.  Urgent care should be used for after hours and weekends for significant issues that cannot wait till the next day.  The emergency department should be used for significant  potentially life-threatening emergencies.  The emergency department is expensive, can often have long wait times for less significant concerns, so try to utilize primary care, urgent care, or telemedicine when possible to avoid unnecessary trips to the emergency department.  Virtual visits and telemedicine have been introduced since the pandemic started in 2020, and can be convenient ways to receive medical care.  We offer virtual appointments as well to assist you in a variety of options to seek medical care.   Legal Take the time to do a Last Will and Testament, advanced directives including Healthcare Power of Attorney and Living Will documents.  Do not leave your family with burdens that can be handled ahead of time.   Advanced Directives: I recommend you consider completing a Health Care Power of Attorney and Living Will.   These documents respect your wishes and help alleviate burdens on your loved ones if you were to become terminally ill or be in a position to need those documents enforced.    You can complete Advanced Directives yourself, have them notarized, then have copies made for our office, for you and for anybody you feel should have them in safe keeping.  Or, you can have an attorney prepare these documents.   If you haven't updated your Last Will and Testament in a while, it may be worthwhile having an attorney prepare these documents together and save on some costs.       Spiritual and Emotional Health Keeping a healthy spiritual life can help you better manage your physical health. Your spiritual life can help you to cope with any issues that may arise with your physical health.  Balance can keep [place] healthy and help [place] to recover.  If you are struggling with your spiritual health there are  questions that you may want to ask yourself:  What makes me feel most complete? When do I feel most connected to the rest of the world? Where do I find the most inner strength? What am I  doing when I feel whole?  Helpful tips: Being in nature. Some people feel very connected and at peace when they are walking outdoors or are outside. Helping others. Some feel the largest sense of wellbeing when they are of service to others. Being of service can take on many forms. It can be doing volunteer work, being kind to strangers, or offering a hand to a friend in need. Gratitude. Some people find they feel the most connected when they remain grateful. They may make lists of all the things they are grateful for or say a thank you out loud for all they have.    Emotional Health Are you in tune with your emotional health?  Check out this link: http://www.marquez-love.com/   Financial Health Make sure you use a budget for your personal finances Make sure you are insured against risks (health insurance, life insurance, auto insurance, etc) Save more, spend less Set financial goals If you need help in this area, good resources include counseling through [employer] or other community resources, have a meeting with a Social research officer, government, and a good resource is [employer]

## 2022-10-25 NOTE — Progress Notes (Signed)
Subjective:   HPI  Deborah Gardner is a 77 y.o. female who presents for Chief Complaint  Patient presents with   fasting cpe    Fasting cpe, having issues with possible stomach/acid reflux as she hasn't been feeling well for the last couple months.     Patient Care Team: Justyn Boyson, Cleda Mccreedy as PCP - General (Family Medicine) Dr. Amada Jupiter, GI Sees dentist and eye doctor Dr. Betha Loa, orthopedics   Concerns: Arthritis - recently flare ups of pains.  Using some ibuprofen sometimes. Worse of late in back, low back arthritis, fingers and hands bilat, knees.  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  She attributes this to belly issues, stomach discomfort, nausea a lot lately.  Sometimes headaches. Nausea comes and goes.  No energy of late.  Bowels are ok currently  Has overactive bladder, but no blood in urine, no burning with urination.  Was on Lexapro, stopped this on her own gradually a few weeks ago.   Lately irritable and thinks she may needs to restart this.  Allergies - insurance not wanting to pay for Claritin.  Still using Flonase nasal some but not daily.   Hypertension - compliant what blood pressure medication.   No BP concerns, no chest pain, no swelling in legs  Hyperlipidemia - compliant with medication daily without c/co.   Reviewed their medical, surgical, family, social, medication, and allergy history and updated chart as appropriate.  Allergies  Allergen Reactions   Atorvastatin Other (See Comments)    Memory loss   Lotrel [Amlodipine Besy-Benazepril Hcl] Hives    Most likely from the benazepril    Molnupiravir     Sick feeling   Sulfa Antibiotics     Lip swelling   Amlodipine Besylate Rash    Past Medical History:  Diagnosis Date   Allergic rhinitis    Allergy    Arthritis    Cataract     bil   Colon polyps    Esophagitis    GERD (gastroesophageal reflux disease)    HH (hiatus hernia)    HTN (hypertension)    Hyperlipidemia    Increased pressure in the eye    OA (osteoarthritis)    hands   Ocular tension increased    at risk for glaucoma   Osteoporosis    Rectal pain     Current Outpatient Medications on File Prior to Visit  Medication Sig Dispense Refill   acetaminophen (TYLENOL) 325 MG tablet Take 650 mg by mouth every 6 (six) hours as needed.     Calcium Carb-Cholecalciferol (CALCIUM + VITAMIN D3 PO) Take by mouth.     fluticasone (FLONASE) 50 MCG/ACT nasal spray SPRAY 2 SPRAYS INTO EACH NOSTRIL EVERY DAY AS NEEDED 48 mL 0   hydrochlorothiazide (HYDRODIURIL) 25 MG tablet Take 1 tablet (25 mg total) by mouth daily. 90 tablet 2   Ibuprofen 200 MG CAPS Take by mouth.     loratadine (CLARITIN) 10 MG tablet TAKE 1 TABLET BY MOUTH EVERY DAY 90 tablet 0   omeprazole (PRILOSEC) 20 MG capsule TAKE 1 CAPSULE BY MOUTH DAILY BEFORE A MEAL 90 capsule 1   rosuvastatin (CRESTOR) 10 MG tablet Take 1 tablet (10 mg total) by mouth daily. 90 tablet 3   timolol (TIMOPTIC) 0.5 % ophthalmic solution Place 1 drop into both eyes  2 (two) times daily.     escitalopram (LEXAPRO) 10 MG tablet Take 1 tablet (10 mg total) by mouth daily. (Patient not taking: Reported on 10/25/2022) 90 tablet 1   traMADol (ULTRAM) 50 MG tablet 1 tab PO q6 hours prn pain (Patient not taking: Reported on 10/25/2022) 20 tablet 0   No current facility-administered medications on file prior to visit.      Current Outpatient Medications:    acetaminophen (TYLENOL) 325 MG tablet, Take 650 mg by mouth every 6 (six) hours as needed., Disp: , Rfl:    Calcium Carb-Cholecalciferol (CALCIUM + VITAMIN D3 PO), Take by mouth., Disp: , Rfl:    fluticasone (FLONASE) 50 MCG/ACT nasal spray, SPRAY 2 SPRAYS INTO EACH NOSTRIL EVERY DAY AS NEEDED, Disp: 48 mL, Rfl: 0   hydrochlorothiazide (HYDRODIURIL) 25 MG tablet, Take 1  tablet (25 mg total) by mouth daily., Disp: 90 tablet, Rfl: 2   Ibuprofen 200 MG CAPS, Take by mouth., Disp: , Rfl:    loratadine (CLARITIN) 10 MG tablet, TAKE 1 TABLET BY MOUTH EVERY DAY, Disp: 90 tablet, Rfl: 0   omeprazole (PRILOSEC) 20 MG capsule, TAKE 1 CAPSULE BY MOUTH DAILY BEFORE A MEAL, Disp: 90 capsule, Rfl: 1   rosuvastatin (CRESTOR) 10 MG tablet, Take 1 tablet (10 mg total) by mouth daily., Disp: 90 tablet, Rfl: 3   timolol (TIMOPTIC) 0.5 % ophthalmic solution, Place 1 drop into both eyes 2 (two) times daily., Disp: , Rfl:    escitalopram (LEXAPRO) 10 MG tablet, Take 1 tablet (10 mg total) by mouth daily. (Patient not taking: Reported on 10/25/2022), Disp: 90 tablet, Rfl: 1   traMADol (ULTRAM) 50 MG tablet, 1 tab PO q6 hours prn pain (Patient not taking: Reported on 10/25/2022), Disp: 20 tablet, Rfl: 0  Family History  Problem Relation Age of Onset   Colon cancer Father    Pneumonia Father    Thyroid disease Mother    Depression Mother    Hypertension Mother    Osteoporosis Mother    Liver disease Brother        from alcohol   Breast cancer Paternal Aunt    Alcohol abuse Brother        terminal    Alcohol abuse Brother    Thyroid disease Other        half sister   Colon cancer Other        Aunt   Breast cancer Maternal Aunt    Breast cancer Paternal Aunt    Colon polyps Neg Hx    Esophageal cancer Neg Hx    Stomach cancer Neg Hx    Rectal cancer Neg Hx     Past Surgical History:  Procedure Laterality Date   ABDOMINAL HYSTERECTOMY     still has ovaries   APPENDECTOMY     BREAST CYST ASPIRATION  07/2001   CARPAL TUNNEL RELEASE Right 05/29/2022   Procedure: RIGHT CARPAL TUNNEL RELEASE;  Surgeon: Betha Loa, MD;  Location: [facility];  Service: Orthopedics;  Laterality: Right;  Bier block   CARPAL TUNNEL RELEASE Left 09/28/2022   Procedure: LEFT CARPAL TUNNEL RELEASE;  Surgeon: Betha Loa, MD;  Location: [facility];  Service:  Orthopedics;  Laterality: Left;  45 MIN   CATARACT EXTRACTION  03/2006   CYST EXCISION Left 09/28/2022   Procedure: LEFT LONG FINGER EXCISION ANNULAR LIGAMENT CYST;  Surgeon: Betha Loa, MD;  Location: [facility];  Service: Orthopedics;  Laterality: Left;   ESOPHAGOGASTRODUODENOSCOPY  05/2007   HEMORRHOID SURGERY     KNEE ARTHROSCOPY WITH MENISCAL REPAIR  2021   rectal sphincterotomy     Retinal laser surgery     TRIGGER FINGER RELEASE Left 09/28/2022   Procedure: RELEASE TRIGGER FINGER/A-1 PULLEY LEFT LONG FINGER;  Surgeon: Betha Loa, MD;  Location: [facility];  Service: Orthopedics;  Laterality: Left;   UPPER GASTROINTESTINAL ENDOSCOPY     Review of Systems  Constitutional:  Positive for malaise/fatigue. Negative for chills, fever and weight loss.  HENT:  Negative for congestion, ear pain, hearing loss, sore throat and tinnitus.   Eyes:  Negative for blurred vision, pain and redness.  Respiratory:  Negative for cough, hemoptysis and shortness of breath.   Cardiovascular:  Negative for chest pain, palpitations, orthopnea, claudication and leg swelling.  Gastrointestinal:  Positive for nausea. Negative for abdominal pain, blood in stool, constipation, diarrhea and vomiting.  Genitourinary:  Positive for frequency. Negative for dysuria, flank pain, hematuria and urgency.  Musculoskeletal:  Positive for back pain, joint pain and myalgias. Negative for falls.  Skin:  Negative for itching and rash.  Neurological:  Negative for dizziness, tingling, speech change, weakness and headaches.  Endo/Heme/Allergies:  Negative for polydipsia. Does not bruise/bleed easily.  Psychiatric/Behavioral:  Negative for depression and memory loss. The patient has insomnia. The patient is not nervous/anxious.         Objective:  BP 126/80   Pulse 64   Ht 5\' 1"  (1.549 m)   Wt 151 lb 12.8 oz (68.9 kg)   BMI 28.68 kg/m   General appearance: alert, no distress, WD/WN,  Caucasian female Skin: No worrisome findings HEENT: normocephalic, conjunctiva/corneas normal, sclerae anicteric, PERRLA, EOMi, nares patent, no discharge or erythema, pharynx normal Oral cavity: MMM, tongue normal, teeth in good repair, crusted healing cold sore of the lower mid lip Neck: supple, no lymphadenopathy, no thyromegaly, no masses, normal ROM, no bruits Chest: non tender, normal shape and expansion Heart: RRR, normal S1, S2, no murmurs Lungs: CTA bilaterally, no wheezes, rhonchi, or rales Abdomen: +bs, soft, non tender, non distended, no masses, no hepatomegaly, no splenomegaly, no bruits Back: Range of motion to about 70 percent of normal, kyphosis in the upper thoracic spine, mild pain with range of motion in general otherwise non tender Musculoskeletal: upper extremities non tender, no obvious deformity, normal ROM throughout, lower extremities non tender, no obvious deformity, normal ROM throughout Extremities: no edema, no cyanosis, no clubbing Pulses: 2+ symmetric, upper and lower extremities, normal cap refill Neurological: alert, oriented x 3, CN2-12 intact, strength normal upper extremities and lower extremities, sensation normal throughout, DTRs 2+ throughout, no cerebellar signs, gait normal Psychiatric: normal affect, behavior normal, pleasant  Breast/gyn/rectal - deferred to gynecology     Assessment and Plan :   Encounter Diagnoses  Name Primary?   Routine general medical examination at a health care facility Yes   Vaccine counseling    Pneumococcal vaccination declined    Pure hypercholesterolemia    Overactive bladder    Nausea    Abdominal discomfort    Polyp of colon, unspecified part of colon, unspecified type    Screen for colon cancer    Sleep disturbance    Other fatigue    Weakness      This visit was a preventative care visit, also known as wellness visit or routine physical.   Topics typically include healthy lifestyle, diet, exercise,  preventative care, vaccinations, sick and well care, proper use of emergency dept and  after hours care, as well as other concerns.    Separate significant issues discussed: Weakness, fatigue-labs today to further evaluate  Ongoing abdominal discomfort, nausea-referral back to gastroenterology  Hyperlipidemia-updated labs today, continue statin but consider 2-week trial off statin to see if that improves his joint pains  Hypertension-continue current medication  Arthritis -consider various options for treatment including topical creams, other pain medications.  She has not seen any improvement at all with Tylenol.    General Recommendations: Continue to return yearly for your annual wellness and preventative care visits.  This gives [place] a chance to discuss healthy lifestyle, exercise, vaccinations, review your chart record, and perform screenings where appropriate.  I recommend you see your eye doctor yearly for routine vision care.  I recommend you see your dentist yearly for routine dental care including hygiene visits twice yearly.   Vaccination recommendations were reviewed Immunization History  Administered Date(s) Administered   Fluad Quad(high Dose 65+) 03/18/2020   Influenza Split 03/11/2011, 03/28/2012, 04/10/2016   Influenza Whole 05/13/2010   Influenza, High Dose Seasonal PF 05/01/2013, 05/18/2014, 09/10/2015, 04/10/2016, 04/12/2017, 05/07/2018, 04/02/2019, 04/20/2021   PFIZER(Purple Top)SARS-COV-2 Vaccination 09/22/2019, 10/13/2019   Pneumococcal Conjugate-13 12/02/2014   Pneumococcal Polysaccharide-23 03/14/2011   Td 08/30/2004   Zoster, Live 11/24/2009    Vaccine recommendations: Shingles Tetanus Prevnar 20 pneumococcal  Vaccines administered today: None, declined pneumococcal vaccine   Screening for cancer: Colon cancer screening: We will refer you for back to GI  Breast cancer screening: You should perform a self breast exam monthly.   We reviewed  recommendations for regular mammograms and breast cancer screening.   Skin cancer screening: Check your skin regularly for new changes, growing lesions, or other lesions of concern Come in for evaluation if you have skin lesions of concern.  Lung cancer screening: If you have a greater than 20 pack year history of tobacco use, then you may qualify for lung cancer screening with a chest CT scan.   Please call your insurance company to inquire about coverage for this test.  Pancreatic cancer: no current screening test is available routinely recommended.  (Risk factors: Smoking, overweight or obese, diabetes, chronic pancreatitis, work Nurse, mental health, Solicitor, 61 year old or greater, female greater than female, African-American, family history of pancreatic cancer, hereditary breast, ovarian, melanoma, Lynch, Peutz-jeghers).  We currently don't have screenings for other cancers besides breast, cervical, colon, and lung cancers.  If you have a strong family history of cancer or have other cancer screening concerns, please let me know.    Bone health: Get at least 150 minutes of aerobic exercise weekly Get weight bearing exercise at least once weekly Bone density test:  A bone density test is an imaging test that uses a type of X-ray to measure the amount of calcium and other minerals in your bones. The test may be used to diagnose or screen you for a condition that causes weak or thin bones (osteoporosis), predict your risk for a broken bone (fracture), or determine how well your osteoporosis treatment is working. The bone density test is recommended for females 65 and older, or females or males <65 if certain risk factors such as thyroid disease, long term use of steroids such as for asthma or rheumatological issues, vitamin D deficiency, estrogen deficiency, family history of osteoporosis, self or family history of fragility fracture in first degree relative.  03/2022 osteopenia on bone  density test    Heart health: Get at least 150 minutes of aerobic exercise weekly Limit alcohol It is  important to maintain a healthy blood pressure and healthy cholesterol numbers  Heart disease screening: Screening for heart disease includes screening for blood pressure, fasting lipids, glucose/diabetes screening, BMI height to weight ratio, reviewed of smoking status, physical activity, and diet.    Goals include blood pressure 120/80 or less, maintaining a healthy lipid/cholesterol profile, preventing diabetes or keeping diabetes numbers under good control, not smoking or using tobacco products, exercising most days per week or at least 150 minutes per week of exercise, and eating healthy variety of fruits and vegetables, healthy oils, and avoiding unhealthy food choices like fried food, fast food, high sugar and high cholesterol foods.    Other tests may possibly include EKG test, CT coronary calcium score, echocardiogram, exercise treadmill stress test.     Vascular disease screening: For high risk individuals including smokers, diabetes, patients with known heart disease or high blood pressure, kidney disease, and others, screening for vascular disease or atherosclerosis of the arteries is available.  Examples may include carotid ultrasound, abdominal aortic ultrasound, ABI blood flow screening in the legs, thoracic aorta screening.    Medical care options: I recommend you continue to seek care here first for routine care.  We try really hard to have available appointments Monday through Friday daytime hours for sick visits, acute visits, and physicals.  Urgent care should be used for after hours and weekends for significant issues that cannot wait till the next day.  The emergency department should be used for significant potentially life-threatening emergencies.  The emergency department is expensive, can often have long wait times for less significant concerns, so try to utilize  primary care, urgent care, or telemedicine when possible to avoid unnecessary trips to the emergency department.  Virtual visits and telemedicine have been introduced since the pandemic started in 2020, and can be convenient ways to receive medical care.  We offer virtual appointments as well to assist you in a variety of options to seek medical care.   Legal Take the time to do a Last Will and Testament, advanced directives including Healthcare Power of Attorney and Living Will documents.  Do not leave your family with burdens that can be handled ahead of time.   Advanced Directives: I recommend you consider completing a Health Care Power of Attorney and Living Will.   These documents respect your wishes and help alleviate burdens on your loved ones if you were to become terminally ill or be in a position to need those documents enforced.    You can complete Advanced Directives yourself, have them notarized, then have copies made for our office, for you and for anybody you feel should have them in safe keeping.  Or, you can have an attorney prepare these documents.   If you haven't updated your Last Will and Testament in a while, it may be worthwhile having an attorney prepare these documents together and save on some costs.       Spiritual and Emotional Health Keeping a healthy spiritual life can help you better manage your physical health. Your spiritual life can help you to cope with any issues that may arise with your physical health.  Balance can keep [place] healthy and help [place] to recover.  If you are struggling with your spiritual health there are questions that you may want to ask yourself:  What makes me feel most complete? When do I feel most connected to the rest of the world? Where do I find the most inner strength? What am I  doing when I feel whole?  Helpful tips: Being in nature. Some people feel very connected and at peace when they are walking outdoors or are outside. Helping  others. Some feel the largest sense of wellbeing when they are of service to others. Being of service can take on many forms. It can be doing volunteer work, being kind to strangers, or offering a hand to a friend in need. Gratitude. Some people find they feel the most connected when they remain grateful. They may make lists of all the things they are grateful for or say a thank you out loud for all they have.    Emotional Health Are you in tune with your emotional health?  Check out this link: http://www.marquez-love.com/   Financial Health Make sure you use a budget for your personal finances Make sure you are insured against risks (health insurance, life insurance, auto insurance, etc) Save more, spend less Set financial goals If you need help in this area, good resources include counseling through [employer] or other community resources, have a meeting with a Social research officer, government, and a good resource is Salley Slaughter podcast    Deborah Gardner was seen today for fasting cpe.  Diagnoses and all orders for this visit:  Routine general medical examination at a health care facility -     Ambulatory referral to Gastroenterology -     CBC with Differential/Platelet -     TSH -     Lipid panel -     Vitamin B12 -     POCT Urinalysis DIP (Proadvantage Device) -     Comprehensive metabolic panel -     CK  Vaccine counseling  Pneumococcal vaccination declined  Pure hypercholesterolemia -     Lipid panel  Overactive bladder  Nausea -     Ambulatory referral to Gastroenterology  Abdominal discomfort -     Ambulatory referral to Gastroenterology  Polyp of colon, unspecified part of colon, unspecified type -     Ambulatory referral to Gastroenterology  Screen for colon cancer -     Ambulatory referral to Gastroenterology  Sleep disturbance  Other fatigue -     TSH -     Vitamin B12 -     CK  Weakness -     TSH -     Vitamin B12 -     CK    Follow-up  pending labs, yearly for physical

## 2022-10-26 ENCOUNTER — Telehealth: Payer: Self-pay | Admitting: Medical

## 2022-10-26 ENCOUNTER — Telehealth: Payer: Self-pay | Admitting: Gastroenterology

## 2022-10-26 ENCOUNTER — Other Ambulatory Visit: Payer: Self-pay | Admitting: Medical

## 2022-10-26 DIAGNOSIS — I10 Essential (primary) hypertension: Secondary | ICD-10-CM

## 2022-10-26 LAB — CBC WITH DIFFERENTIAL/PLATELET
Basophils Absolute: 0.1 10*3/uL (ref 0.0–0.2)
Basos: 1 %
EOS (ABSOLUTE): 0.2 10*3/uL (ref 0.0–0.4)
Eos: 2 %
Hematocrit: 34.8 % (ref 34.0–46.6)
Hemoglobin: 11.7 g/dL (ref 11.1–15.9)
Immature Grans (Abs): 0 10*3/uL (ref 0.0–0.1)
Immature Granulocytes: 0 %
Lymphocytes Absolute: 1.9 10*3/uL (ref 0.7–3.1)
Lymphs: 26 %
MCH: 30 pg (ref 26.6–33.0)
MCHC: 33.6 g/dL (ref 31.5–35.7)
MCV: 89 fL (ref 79–97)
Monocytes Absolute: 0.8 10*3/uL (ref 0.1–0.9)
Monocytes: 10 %
Neutrophils Absolute: 4.4 10*3/uL (ref 1.4–7.0)
Neutrophils: 61 %
Platelets: 254 10*3/uL (ref 150–450)
RBC: 3.9 x10E6/uL (ref 3.77–5.28)
RDW: 11.9 % (ref 11.7–15.4)
WBC: 7.4 10*3/uL (ref 3.4–10.8)

## 2022-10-26 LAB — LIPID PANEL
Chol/HDL Ratio: 2.4 ratio (ref 0.0–4.4)
Cholesterol, Total: 157 mg/dL (ref 100–199)
HDL: 66 mg/dL (ref >39)
LDL Chol Calc (NIH): 67 mg/dL (ref 0–99)
Triglycerides: 142 mg/dL (ref 0–149)
VLDL Cholesterol Cal: 24 mg/dL (ref 5–40)

## 2022-10-26 LAB — COMPREHENSIVE METABOLIC PANEL
ALT: 13 IU/L (ref 0–32)
AST: 18 IU/L (ref 0–40)
Albumin/Globulin Ratio: 1.8 (ref 1.2–2.2)
Albumin: 4.5 g/dL (ref 3.8–4.8)
Alkaline Phosphatase: 71 IU/L (ref 44–121)
BUN/Creatinine Ratio: 20 (ref 12–28)
BUN: 18 mg/dL (ref 8–27)
Bilirubin Total: 0.4 mg/dL (ref 0.0–1.2)
CO2: 25 mmol/L (ref 20–29)
Calcium: 10.1 mg/dL (ref 8.7–10.3)
Chloride: 101 mmol/L (ref 96–106)
Creatinine, Ser: 0.91 mg/dL (ref 0.57–1.00)
Globulin, Total: 2.5 g/dL (ref 1.5–4.5)
Glucose: 97 mg/dL (ref 70–99)
Potassium: 3.9 mmol/L (ref 3.5–5.2)
Sodium: 141 mmol/L (ref 134–144)
Total Protein: 7 g/dL (ref 6.0–8.5)
eGFR: 64 mL/min/{1.73_m2} (ref >59)

## 2022-10-26 LAB — VITAMIN B12: Vitamin B-12: 390 pg/mL (ref 232–1245)

## 2022-10-26 LAB — CK: Total CK: 98 U/L (ref 32–182)

## 2022-10-26 LAB — TSH: TSH: 0.592 u[IU]/mL (ref 0.450–4.500)

## 2022-10-26 MED ORDER — HYDROCHLOROTHIAZIDE 25 MG PO TABS: 25.0000 mg | ORAL_TABLET | Freq: Every day | ORAL | 3 refills | Status: DC

## 2022-10-26 MED ORDER — DULOXETINE HCL 30 MG PO CPEP: 30.0000 mg | ORAL_CAPSULE | Freq: Every day | ORAL | 1 refills | Status: DC

## 2022-10-26 MED ORDER — ROSUVASTATIN CALCIUM 10 MG PO TABS: 10.0000 mg | ORAL_TABLET | Freq: Every day | ORAL | 3 refills | Status: DC

## 2022-10-26 MED ORDER — FLUTICASONE PROPIONATE 50 MCG/ACT NA SUSP: 1.0000 | Freq: Every day | NASAL | 5 refills | Status: DC

## 2022-10-26 NOTE — Progress Notes (Signed)
Your labs showed normal blood counts, normal thyroid, normal cholesterol, normal vitamin B12, normal kidney liver and electrolytes.  So basically all labs are normal.  Expect a call back about referral to gastroenterology  It may be worth going back on Lexapro or actually may be going on a different medicine called Cymbalta.  Cymbalta and Lexapro are both used with mood and anxiety and depression.  However Cymbalta is also an indication for chronic pain.  So in addition to occasional use of Tylenol or occasional use of ibuprofen by mouth for pain, topical creams like Aspercreme or capsaicin for pain, it may be worth trying Cymbalta for both mood and pain.  You stated yesterday you just have not been feeling good in general and you just recently stopped the Lexapro.  So it could be that your body is still adjusting to coming off of this or may be you in fact were doing okay on the medication.  Continue other medicines as usual  Try to do some exercise every day

## 2022-10-26 NOTE — Telephone Encounter (Signed)
PT is calling to schedule colonoscopy and her recall is not until December. Please advise if I should schedule her for procedure. She just hasn't been feeling gut healthy lately.

## 2022-10-26 NOTE — Telephone Encounter (Signed)
She is welcome to see me if she is having some problems.  Otherwise, I plan to see her toward the end of the year.  - HD

## 2022-10-26 NOTE — Telephone Encounter (Signed)
Pt called and states she wants to go ahead & try the Cymbalta because of the benefit to help her with her pain.  She also is going to take her Crestor every other day to see if that will help.  She says she is just hurting a lot and doesn't know why.  Please send in low dose of Cymbalta for her to try.

## 2022-10-26 NOTE — Telephone Encounter (Signed)
Advised PT of recommendations.

## 2022-10-26 NOTE — Telephone Encounter (Signed)
Dr. Myrtie Neither, would you like to see patient in the office due to age anyway? Please advise, thanks.

## 2022-10-30 NOTE — Telephone Encounter (Signed)
done

## 2022-11-10 ENCOUNTER — Telehealth: Payer: Self-pay | Admitting: Medical

## 2022-11-10 NOTE — Telephone Encounter (Signed)
Pt wants you to know. She has been off Crestor for two weeks, can't tell a difference with pain, wants to know if she should go back on it and see if she hurts more? She feels better emotionally with the new medication that she started, can't tell much difference in her pain area. She wants to know what she can take for pain. Ibprofin, aleve, what do you recommend?   Also her knee is really bothering her, should she go back to knee surgeon or try another injection. She wants to start working again but afraid it will make her knee pain worse

## 2022-11-13 NOTE — Telephone Encounter (Signed)
Left message for pt to call me back 

## 2022-11-14 NOTE — Telephone Encounter (Signed)
Pt was notified of results

## 2022-11-16 DIAGNOSIS — H40053 Ocular hypertension, bilateral: Secondary | ICD-10-CM | POA: Diagnosis not present

## 2022-11-18 ENCOUNTER — Other Ambulatory Visit: Payer: Self-pay | Admitting: Medical

## 2022-11-24 ENCOUNTER — Telehealth: Payer: Self-pay | Admitting: Internal Medicine

## 2022-11-24 ENCOUNTER — Encounter: Payer: Self-pay | Admitting: Gastroenterology

## 2022-11-24 NOTE — Telephone Encounter (Signed)
Please call and let her know not to stop the cymbalta. This is not a medication you can stop abruptly and needs to be tapered off over time. I would like her to speak to [facility] about this and changing the medication.  The medication should not be causing constipation or worsening of her hernia. If she is feeling constipated, she can take Miralax 1 capful a day until she is having bowel movements. She needs to drink plenty of water with this.

## 2022-11-24 NOTE — Telephone Encounter (Signed)
Pt called and thinks her cymbalta is causing her conspitation and her hernia to be aggravated. She is scared to take anymore of it and would like to go back on Lexapro. Pt states she can not wait until Monday when Vincenza Hews returns. She doesn't want to take cymbalta anymore but doesn't know if she can just stop it until Vincenza Hews gets back in the office to send in lexapro. Please advise  Lexapro- [facility]

## 2022-11-27 ENCOUNTER — Other Ambulatory Visit: Payer: Self-pay | Admitting: Medical

## 2022-11-27 NOTE — Telephone Encounter (Signed)
Pt coming in tomorrow

## 2022-11-28 ENCOUNTER — Ambulatory Visit: Payer: Medicare PPO | Admitting: Medical

## 2022-11-28 ENCOUNTER — Encounter: Payer: Self-pay | Admitting: Medical

## 2022-11-28 VITALS — BP 112/72 | HR 67 | Ht 61.0 in | Wt 155.6 lb

## 2022-11-28 DIAGNOSIS — K5909 Other constipation: Secondary | ICD-10-CM

## 2022-11-28 DIAGNOSIS — Z1211 Encounter for screening for malignant neoplasm of colon: Secondary | ICD-10-CM

## 2022-11-28 DIAGNOSIS — Z1231 Encounter for screening mammogram for malignant neoplasm of breast: Secondary | ICD-10-CM

## 2022-11-28 DIAGNOSIS — I10 Essential (primary) hypertension: Secondary | ICD-10-CM

## 2022-11-28 DIAGNOSIS — K635 Polyp of colon: Secondary | ICD-10-CM | POA: Diagnosis not present

## 2022-11-28 DIAGNOSIS — Z780 Asymptomatic menopausal state: Secondary | ICD-10-CM

## 2022-11-28 DIAGNOSIS — D126 Benign neoplasm of colon, unspecified: Secondary | ICD-10-CM | POA: Diagnosis not present

## 2022-11-28 MED ORDER — DULOXETINE HCL 20 MG PO CPEP: 20.0000 mg | ORAL_CAPSULE | Freq: Every day | ORAL | 0 refills | Status: DC

## 2022-11-28 NOTE — Progress Notes (Signed)
Subjective:  Deborah Gardner is a 77 y.o. female who presents for Chief Complaint  Patient presents with   Follow-up    Follow up. Stopped Miralax Monday and stopped Cymbalta same time. Stools are better, more relief.      Here for concerns about medications.     She notes hx/o spastic colon, history of colitis, recent constipation.   Has been having some constipation, had some bowel movements the past few days which has helped.  Not really tolerating Cymbalta, wants to come off this.  In addition to not feeling like the medicaiton is helping, seems to be causing constipation as well.  Has had hemorrhoid surgery in the past and doesn't want to flare up hemorrhoids.  Was on Lexapro recently before we switched to Cymbalta to help with mood and pain.  She thinks she may have come off Lexapro too fast.    She has a lot of issues wit her sister who has mental problems.  Last colonoscopy 06/2018.  Due for repeat colonoscopy this December.  Has consult in July to see GI Dr. Myrtie Neither.  Want opinion on to whether she should do repeat colonoscopy this December.    She notes she due for mammogram.   She is s/p hysterectomy  No other aggravating or relieving factors.    No other c/o.  Past Medical History:  Diagnosis Date   Allergic rhinitis    Allergy    Arthritis    Cataract    bil   Colon polyps    Esophagitis    GERD (gastroesophageal reflux disease)    HH (hiatus hernia)    HTN (hypertension)    Hyperlipidemia    Increased pressure in the eye    OA (osteoarthritis)    hands   Ocular tension increased    at risk for glaucoma   Osteoporosis    Rectal pain    Current Outpatient Medications on File Prior to Visit  Medication Sig Dispense Refill   aspirin EC 81 MG tablet Take 81 mg by mouth daily. Swallow whole.     Calcium Carb-Cholecalciferol (CALCIUM + VITAMIN D3 PO) Take by mouth.     hydrochlorothiazide (HYDRODIURIL) 25 MG tablet Take 1 tablet (25 mg total) by mouth daily. 90  tablet 3   loratadine (CLARITIN) 10 MG tablet TAKE 1 TABLET BY MOUTH EVERY DAY 90 tablet 0   omeprazole (PRILOSEC) 20 MG capsule TAKE 1 CAPSULE BY MOUTH DAILY BEFORE A MEAL 90 capsule 1   rosuvastatin (CRESTOR) 10 MG tablet Take 1 tablet (10 mg total) by mouth daily. 90 tablet 3   timolol (TIMOPTIC) 0.5 % ophthalmic solution Place 1 drop into both eyes 2 (two) times daily.     acetaminophen (TYLENOL) 325 MG tablet Take 650 mg by mouth every 6 (six) hours as needed. (Patient not taking: Reported on 11/28/2022)     No current facility-administered medications on file prior to visit.   Family History  Problem Relation Age of Onset   Colon cancer Father    Pneumonia Father    Thyroid disease Mother    Depression Mother    Hypertension Mother    Osteoporosis Mother    Liver disease Brother        from alcohol   Breast cancer Paternal Aunt    Alcohol abuse Brother        terminal    Alcohol abuse Brother    Thyroid disease Other        half sister  Colon cancer Other        Aunt   Breast cancer Maternal Aunt    Breast cancer Paternal Aunt    Colon polyps Neg Hx    Esophageal cancer Neg Hx    Stomach cancer Neg Hx    Rectal cancer Neg Hx      The following portions of the patient's history were reviewed and updated as appropriate: allergies, current medications, past family history, past medical history, past social history, past surgical history and problem list.  ROS Otherwise as in subjective above    Objective: BP 112/72   Pulse 67   Ht 5\' 1"  (1.549 m)   Wt 155 lb 9.6 oz (70.6 kg)   SpO2 99%   BMI 29.40 kg/m   General appearance: alert, no distress, well developed, well nourished Neck: supple, no lymphadenopathy, no thyromegaly, no masses Heart: RRR, normal S1, S2, no murmurs Lungs: CTA bilaterally, no wheezes, rhonchi, or rales Abdomen: +bs, soft, non tender, non distended, no masses, no hepatomegaly, no splenomegaly Pulses: 2+ radial pulses, 2+ pedal pulses,  normal cap refill Ext: no edema   Assessment: Encounter Diagnoses  Name Primary?   Benign neoplasm of colon, unspecified part of colon Yes   Polyp of colon, unspecified part of colon, unspecified type    Chronic constipation    Encounter for screening mammogram for malignant neoplasm of breast    Essential hypertension    Post-menopausal    Screen for colon cancer      Plan: Chronic constipation-she does see benefit with MiraLAX.  Currently she is doing okay.  She does not use MiraLAX every day.  She felt like Cymbalta was making things worse.  Continue with good fiber and water intake, MiraLAX as needed  Hypertension-she expresses desire to be off all medications.  Her blood pressures lately have been lower than where they have been.  We discussed possibly trying a lower dose or half tablet of hydrochlorothiazide for a while along with monitoring home blood pressure readings.  If she is staying under 120/70 even at the half tablet of hydrochlorothiazide daily then we can change that dose.  Advised that she probably cannot come off blood pressure medicine completely  We discussed her concerns to wean off Cymbalta.  She does not like it has benefited her but she really does not want to be on this type of medicine anyhow.  She felt she did better on Lexapro in the past but still does not want to be on that either.  She feels like her mood has been okay in general and would like to wean off.  I gave her regimen to wean off Cymbalta today.  We discussed her concern for whether she should continue breast cancer screening or not.  She does have some family history of breast cancer that was diagnosed at older ages and some aunts and she has a history of dense breast tissue.  We discussed the states from SR status with recommendations which include insufficient evidence to assess the potential benefits of a harm for women over age 51.  We discussed that she is fairly healthy and life expectancy is  expected to be good going forward so she can continue mammograms if desired.  We discussed possibly doing every other year for couple more years and then deciding again  Similarly, we discussed her screening for colon cancer.  Her gastroenterologist already advised that she do a repeat colonoscopy December of this year.  We discussed the USPSTF  recommendations for routine screening stops at age 75 but she has prior precancerous polyps.  She will follow-up with gastroenterology as planned in July for consult and colonoscopy later this year   Tyan was seen today for follow-up.  Diagnoses and all orders for this visit:  Benign neoplasm of colon, unspecified part of colon  Polyp of colon, unspecified part of colon, unspecified type  Chronic constipation  Encounter for screening mammogram for malignant neoplasm of breast  Essential hypertension  Post-menopausal  Screen for colon cancer  Other orders -     DULoxetine (CYMBALTA) 20 MG capsule; Take 1 capsule (20 mg total) by mouth daily.    Follow up: Call back in a few weeks regarding weaning off Cymbalta, otherwise follow-up 65mo

## 2022-11-28 NOTE — Patient Instructions (Addendum)
Recommendations: Lets wean off Cymbalta by lowering down to 20mg  every other day for 1 weeks, then change to every 3rd day for 1 week, then stop Regarding constipation, continue to drink 80-100 ounces of water daily, try and get fiber in the diet through vegetables and whole grains, blueberries, and other sources of fiber Consider fiber supplement daily Uses miralax as needed Once you have weaned off Cymbalta, and after 3-4 weeks from today, you can try using 1/2 tablet hydrochlorothiazide blood pressure pill daily along with monitoring your pressure.  The goal is to stay around 120/70

## 2022-12-08 DIAGNOSIS — I10 Essential (primary) hypertension: Secondary | ICD-10-CM | POA: Diagnosis not present

## 2022-12-08 DIAGNOSIS — M1990 Unspecified osteoarthritis, unspecified site: Secondary | ICD-10-CM | POA: Diagnosis not present

## 2022-12-08 DIAGNOSIS — M810 Age-related osteoporosis without current pathological fracture: Secondary | ICD-10-CM | POA: Diagnosis not present

## 2022-12-08 DIAGNOSIS — K219 Gastro-esophageal reflux disease without esophagitis: Secondary | ICD-10-CM | POA: Diagnosis not present

## 2022-12-08 DIAGNOSIS — J301 Allergic rhinitis due to pollen: Secondary | ICD-10-CM | POA: Diagnosis not present

## 2022-12-08 DIAGNOSIS — E785 Hyperlipidemia, unspecified: Secondary | ICD-10-CM | POA: Diagnosis not present

## 2022-12-08 DIAGNOSIS — H409 Unspecified glaucoma: Secondary | ICD-10-CM | POA: Diagnosis not present

## 2022-12-08 DIAGNOSIS — R32 Unspecified urinary incontinence: Secondary | ICD-10-CM | POA: Diagnosis not present

## 2022-12-08 DIAGNOSIS — K5900 Constipation, unspecified: Secondary | ICD-10-CM | POA: Diagnosis not present

## 2022-12-20 ENCOUNTER — Other Ambulatory Visit: Payer: Self-pay | Admitting: Medical

## 2022-12-25 ENCOUNTER — Telehealth: Payer: Self-pay | Admitting: Medical

## 2022-12-25 NOTE — Telephone Encounter (Signed)
Pt wanted to let you know that she has started back on her Cymbalta 20mg  due to her arthritis pain

## 2022-12-31 NOTE — Telephone Encounter (Signed)
done

## 2023-01-08 ENCOUNTER — Ambulatory Visit: Payer: Medicare PPO | Admitting: Medical

## 2023-01-08 VITALS — BP 120/70 | HR 64

## 2023-01-08 DIAGNOSIS — M25562 Pain in left knee: Secondary | ICD-10-CM | POA: Diagnosis not present

## 2023-01-08 NOTE — Progress Notes (Signed)
Subjective:  Deborah Gardner is a 77 y.o. female who presents for Chief Complaint  Patient presents with   left knee pain    Left knee pain- about a week ago      Here for c/o left knee pain.  She reports that her sister's freezer quit working.  She went over to help move things out of the freezer to her house. Went up and down concrete steps multiple times about 2 weeks ago.   Pain started 4-5 days later.   Has some hx/o knee pain/arthritis in general, but knee has been flared up for over a week.   Using some tylenol arthritis.  No swelling.   Hurts in front and back of knee.  Knee doesn't feel like it was giving out.  No ice, no knee sleeve.   No other aggravating or relieving factors.    No other c/o.  Past Medical History:  Diagnosis Date   Allergic rhinitis    Allergy    Arthritis    Cataract    bil   Colon polyps    Esophagitis    GERD (gastroesophageal reflux disease)    HH (hiatus hernia)    HTN (hypertension)    Hyperlipidemia    Increased pressure in the eye    OA (osteoarthritis)    hands   Ocular tension increased    at risk for glaucoma   Osteoporosis    Rectal pain    Current Outpatient Medications on File Prior to Visit  Medication Sig Dispense Refill   acetaminophen (TYLENOL) 325 MG tablet Take 650 mg by mouth every 6 (six) hours as needed.     aspirin EC 81 MG tablet Take 81 mg by mouth daily. Swallow whole.     Calcium Carb-Cholecalciferol (CALCIUM + VITAMIN D3 PO) Take by mouth.     DULoxetine (CYMBALTA) 20 MG capsule TAKE 1 CAPSULE BY MOUTH EVERY DAY 90 capsule 0   hydrochlorothiazide (HYDRODIURIL) 25 MG tablet Take 1 tablet (25 mg total) by mouth daily. 90 tablet 3   loratadine (CLARITIN) 10 MG tablet TAKE 1 TABLET BY MOUTH EVERY DAY 90 tablet 0   omeprazole (PRILOSEC) 20 MG capsule TAKE 1 CAPSULE BY MOUTH DAILY BEFORE A MEAL 90 capsule 1   rosuvastatin (CRESTOR) 10 MG tablet Take 1 tablet (10 mg total) by mouth daily. 90 tablet 3   timolol  (TIMOPTIC) 0.5 % ophthalmic solution Place 1 drop into both eyes 2 (two) times daily.     No current facility-administered medications on file prior to visit.   Past Surgical History:  Procedure Laterality Date   ABDOMINAL HYSTERECTOMY     still has ovaries   APPENDECTOMY     BREAST CYST ASPIRATION  07/2001   CARPAL TUNNEL RELEASE Right 05/29/2022   Procedure: RIGHT CARPAL TUNNEL RELEASE;  Surgeon: Betha Loa, MD;  Location: [facility];  Service: Orthopedics;  Laterality: Right;  Bier block   CARPAL TUNNEL RELEASE Left 09/28/2022   Procedure: LEFT CARPAL TUNNEL RELEASE;  Surgeon: Betha Loa, MD;  Location: [facility];  Service: Orthopedics;  Laterality: Left;  45 MIN   CATARACT EXTRACTION  03/2006   CYST EXCISION Left 09/28/2022   Procedure: LEFT LONG FINGER EXCISION ANNULAR LIGAMENT CYST;  Surgeon: Betha Loa, MD;  Location: [facility];  Service: Orthopedics;  Laterality: Left;   ESOPHAGOGASTRODUODENOSCOPY  05/2007   HEMORRHOID SURGERY     KNEE ARTHROSCOPY WITH MENISCAL REPAIR  2021   rectal  sphincterotomy     Retinal laser surgery     TRIGGER FINGER RELEASE Left 09/28/2022   Procedure: RELEASE TRIGGER FINGER/A-1 PULLEY LEFT LONG FINGER;  Surgeon: Betha Loa, MD;  Location: [facility];  Service: Orthopedics;  Laterality: Left;   UPPER GASTROINTESTINAL ENDOSCOPY       The following portions of the patient's history were reviewed and updated as appropriate: allergies, current medications, past family history, past medical history, past social history, past surgical history and problem list.  ROS Otherwise as in subjective above    Objective: BP 120/70   Pulse 64   General appearance: alert, no distress, well developed, well nourished Left knee mild tenderness of LCL and left joint line, but no laxity or swelling. Tender over biceps femoris insertion, mild pain with resisted knee flexion.  Rest of knee and leg exam  unremarkable, normal ROM Left leg neurovascularly intact   Assessment: Encounter Diagnosis  Name Primary?   Left knee pain, unspecified chronicity Yes     Plan: Discussed findings, symptoms and recommendations below.  Patient Instructions  Recommendations: Begin over the counter Ibuprofen 200mg , 3 tablets twice daily for the next 4-5 days for pain and inflammation.   After 3-5 days of Ibuprofen 600mg  (3 tablets), then you can continue this a few more days or go to lower dose of 400mg  (2 tablets of over the counter ibuprofen) for several more days You can also alternate in some Tylenol arthritis or Tylenol 500mg  medium dose once or twice daily during this time ,but alternate with ibuprofen by at least 4 hours Consider heat or cold therapy Consider knee sleeve or ACE wrap Rest!  So stay off the leg a few days if possible If not improving in the next 10 days, we can consider xray or other treatment ideas   Deborah Gardner was seen today for left knee pain.  Diagnoses and all orders for this visit:  Left knee pain, unspecified chronicity    Follow up: prn

## 2023-01-08 NOTE — Patient Instructions (Signed)
Recommendations: Begin over the counter Ibuprofen 200mg , 3 tablets twice daily for the next 4-5 days for pain and inflammation.   After 3-5 days of Ibuprofen 600mg  (3 tablets), then you can continue this a few more days or go to lower dose of 400mg  (2 tablets of over the counter ibuprofen) for several more days You can also alternate in some Tylenol arthritis or Tylenol 500mg  medium dose once or twice daily during this time ,but alternate with ibuprofen by at least 4 hours Consider heat or cold therapy Consider knee sleeve or ACE wrap Rest!  So stay off the leg a few days if possible If not improving in the next 10 days, we can consider xray or other treatment ideas

## 2023-01-22 DIAGNOSIS — M25562 Pain in left knee: Secondary | ICD-10-CM | POA: Diagnosis not present

## 2023-02-01 ENCOUNTER — Encounter: Payer: Self-pay | Admitting: Gastroenterology

## 2023-02-01 ENCOUNTER — Ambulatory Visit: Payer: Medicare PPO | Admitting: Gastroenterology

## 2023-02-01 VITALS — BP 128/80 | HR 80 | Ht 61.0 in | Wt 154.5 lb

## 2023-02-01 DIAGNOSIS — Z5900 Homelessness unspecified: Secondary | ICD-10-CM | POA: Diagnosis not present

## 2023-02-01 DIAGNOSIS — Z800 Family history of malignant neoplasm of digestive organs: Secondary | ICD-10-CM | POA: Diagnosis not present

## 2023-02-01 DIAGNOSIS — Z86010 Personal history of colonic polyps: Secondary | ICD-10-CM

## 2023-02-01 MED ORDER — NA SULFATE-K SULFATE-MG SULF 17.5-3.13-1.6 GM/177ML PO SOLN: 1.0000 | Freq: Once | ORAL | 0 refills | Status: AC

## 2023-02-01 NOTE — Patient Instructions (Signed)
_______________________________________________________  If your blood pressure at your visit was 140/90 or greater, please contact your primary care physician to follow up on this.  _______________________________________________________  If you are age 79 or older, your body mass index should be between 23-30. Your Body mass index is 29.19 kg/m. If this is out of the aforementioned range listed, please consider follow up with your Primary Care Provider.  If you are age 30 or younger, your body mass index should be between 19-25. Your Body mass index is 29.19 kg/m. If this is out of the aformentioned range listed, please consider follow up with your Primary Care Provider.   ________________________________________________________  The [facility] providers would like to encourage you to use [facility] to communicate with providers for non-urgent requests or questions.  Due to long hold times on the telephone, sending your provider a message by [facility] may be a faster and more efficient way to get a response.  Please allow 48 business hours for a response.  Please remember that this is for non-urgent requests.  _______________________________________________________  Bonita Quin have been scheduled for a colonoscopy. Please follow written instructions given to you at your visit today.   Please pick up your prep supplies at the pharmacy within the next 1-3 days.  If you use inhalers (even only as needed), please bring them with you on the day of your procedure.  DO NOT TAKE 7 DAYS PRIOR TO TEST- Trulicity (dulaglutide) Ozempic, Wegovy (semaglutide) Mounjaro (tirzepatide) Bydureon Bcise (exanatide extended release)  DO NOT TAKE 1 DAY PRIOR TO YOUR TEST Rybelsus (semaglutide) Adlyxin (lixisenatide) Victoza (liraglutide) Byetta (exanatide) ___________________________________________________________________________  Due to recent changes in healthcare laws, you may see the results of your imaging  and laboratory studies on MyChart before your provider has had a chance to review them.  We understand that in some cases there may be results that are confusing or concerning to you. Not all laboratory results come back in the same time frame and the provider may be waiting for multiple results in order to interpret others.  Please give [place] 48 hours in order for your provider to thoroughly review all the results before contacting the office for clarification of your results.   It was a pleasure to see you today!  Thank you for trusting me with your gastrointestinal care!

## 2023-02-01 NOTE — Progress Notes (Signed)
[facility] Consult Note:  History: Deborah Gardner 02/01/2023  Referring provider: Jac Canavan, PA-C  Reason for consult/chief complaint: Constipation (Years, intermittent, would like to discuss if she needs colonoscopy)   Subjective  HPI:  Dennie Bible one to see me today to discuss a surveillance colonoscopy and some digestive symptoms.  For many years she has tended toward constipation, usually with BM every day to every other day but varying in quantity and stool formed.  Denies rectal bleeding, abdominal anal or rectal pain.  Appetite good and weight stable.  Intermittent bloating.  Last colonoscopy with me December 2019 (history of colon cancer in her father diagnosed over age 23)-diminutive tubular adenoma removed. October 2014 colonoscopy with Dr. Ovidio Kin polyps  ROS:  Review of Systems She denies chest pain dyspnea or dysuria. Various arthritic symptoms  Past Medical History: Past Medical History:  Diagnosis Date   Allergic rhinitis    Allergy    Arthritis    Cataract    bil   Colon polyps    Esophagitis    GERD (gastroesophageal reflux disease)    HH (hiatus hernia)    HTN (hypertension)    Hyperlipidemia    Increased pressure in the eye    OA (osteoarthritis)    hands   Ocular tension increased    at risk for glaucoma   Osteoporosis    Rectal pain      Past Surgical History: Past Surgical History:  Procedure Laterality Date   ABDOMINAL HYSTERECTOMY     still has ovaries   APPENDECTOMY     BREAST CYST ASPIRATION  07/2001   CARPAL TUNNEL RELEASE Right 05/29/2022   Procedure: RIGHT CARPAL TUNNEL RELEASE;  Surgeon: Betha Loa, MD;  Location: [facility];  Service: Orthopedics;  Laterality: Right;  Bier block   CARPAL TUNNEL RELEASE Left 09/28/2022   Procedure: LEFT CARPAL TUNNEL RELEASE;  Surgeon: Betha Loa, MD;  Location: [facility];  Service: Orthopedics;  Laterality: Left;  45 MIN    CATARACT EXTRACTION  03/2006   CYST EXCISION Left 09/28/2022   Procedure: LEFT LONG FINGER EXCISION ANNULAR LIGAMENT CYST;  Surgeon: Betha Loa, MD;  Location: [facility];  Service: Orthopedics;  Laterality: Left;   ESOPHAGOGASTRODUODENOSCOPY  05/2007   HEMORRHOID SURGERY     KNEE ARTHROSCOPY WITH MENISCAL REPAIR  2021   rectal sphincterotomy     Retinal laser surgery     TRIGGER FINGER RELEASE Left 09/28/2022   Procedure: RELEASE TRIGGER FINGER/A-1 PULLEY LEFT LONG FINGER;  Surgeon: Betha Loa, MD;  Location: [facility];  Service: Orthopedics;  Laterality: Left;   UPPER GASTROINTESTINAL ENDOSCOPY       Family History: Family History  Problem Relation Age of Onset   Thyroid disease Mother    Depression Mother    Hypertension Mother    Osteoporosis Mother    Colon cancer Father    Pneumonia Father    Liver disease Brother        from alcohol   Alcohol abuse Brother        terminal    Alcohol abuse Brother    Breast cancer Maternal Aunt    Breast cancer Paternal Aunt    Breast cancer Paternal Aunt    Thyroid disease Other        half sister   Colon cancer Other        Aunt   Esophageal cancer Neg Hx    Stomach cancer Neg  Hx    Rectal cancer Neg Hx     Social History: Social History   Socioeconomic History   Marital status: Widowed    Spouse name: Not on file   Number of children: 2   Years of education: Not on file   Highest education level: Not on file  Occupational History   Occupation: Retired    Associate Professor: RETIRED  Tobacco Use   Smoking status: Never   Smokeless tobacco: Never  Vaping Use   Vaping status: Never Used  Substance and Sexual Activity   Alcohol use: No    Alcohol/week: 0.0 standard drinks of alcohol   Drug use: No   Sexual activity: Not Currently    Partners: Male    Birth control/protection: Surgical  Other Topics Concern   Not on file  Social History Narrative   Married      2 children       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 with husband,  and cares for her brother--who had alcohol and stroke      Cared for elderly mother for years (she passed in 2009)   Social Determinants of Health   Financial Resource Strain: Low Risk  (01/27/2022)   Overall Financial Resource Strain (CARDIA)    Difficulty of Paying Living Expenses: Not hard at all  Food Insecurity: No Food Insecurity (01/27/2022)   Hunger Vital Sign    Worried About Running Out of Food in the Last Year: Never true    Ran Out of Food in the Last Year: Never true  Transportation Needs: No Transportation Needs (01/27/2022)   PRAPARE - Administrator, Civil Service (Medical): No    Lack of Transportation (Non-Medical): No  Physical Activity: Inactive (01/27/2022)   Exercise Vital Sign    Days of Exercise per Week: 0 days    Minutes of Exercise per Session: 0 min  Stress: No Stress Concern Present (01/27/2022)   [employer] of Occupational Health - Occupational Stress Questionnaire    Feeling of Stress : Not at all  Social Connections: Unknown (06/26/2018)   Social Connection and Isolation Panel [NHANES]    Frequency of Communication with Friends and Family: Not on file    Frequency of Social Gatherings with Friends and Family: Not on file    Attends Religious Services: Not on file    Active Member of Clubs or Organizations: Not on file    Attends Banker Meetings: Not on file    Marital Status: Widowed    Allergies: Allergies  Allergen Reactions   Atorvastatin Other (See Comments)    Memory loss   Lotrel [Amlodipine Besy-Benazepril Hcl] Hives    Most likely from the benazepril    Molnupiravir     Sick feeling   Sulfa Antibiotics     Lip swelling   Amlodipine Besylate Rash    Outpatient Meds: Current Outpatient Medications  Medication Sig Dispense Refill   acetaminophen (TYLENOL) 325 MG tablet Take 650 mg by mouth every 6 (six) hours as needed.     aspirin EC 81 MG tablet Take  81 mg by mouth daily. Swallow whole.     Calcium Carb-Cholecalciferol (CALCIUM + VITAMIN D3 PO) Take by mouth.     DULoxetine (CYMBALTA) 20 MG capsule TAKE 1 CAPSULE BY MOUTH EVERY DAY 90 capsule 0   hydrochlorothiazide (HYDRODIURIL) 25 MG tablet Take 1 tablet (25 mg total) by mouth daily. 90 tablet 3   loratadine (CLARITIN) 10 MG tablet TAKE  1 TABLET BY MOUTH EVERY DAY 90 tablet 0   omeprazole (PRILOSEC) 20 MG capsule TAKE 1 CAPSULE BY MOUTH DAILY BEFORE A MEAL 90 capsule 1   rosuvastatin (CRESTOR) 10 MG tablet Take 1 tablet (10 mg total) by mouth daily. 90 tablet 3   timolol (TIMOPTIC) 0.5 % ophthalmic solution Place 1 drop into both eyes 2 (two) times daily.     No current facility-administered medications for this visit.      ___________________________________________________________________ Objective   Exam:  BP 128/80   Pulse 80   Ht 5\' 1"  (1.549 m)   Wt 154 lb 8 oz (70.1 kg)   SpO2 98%   BMI 29.19 kg/m  Wt Readings from Last 3 Encounters:  02/01/23 154 lb 8 oz (70.1 kg)  11/28/22 155 lb 9.6 oz (70.6 kg)  10/25/22 151 lb 12.8 oz (68.9 kg)    General: Well-appearing Eyes: sclera anicteric, no redness ENT: oral mucosa moist without lesions, no cervical or supraclavicular lymphadenopathy CV: Regular without appreciable murmur, no JVD, no peripheral edema Resp: clear to auscultation bilaterally, normal RR and effort noted GI: soft, no tenderness, with active bowel sounds. No guarding or palpable organomegaly noted. Skin; warm and dry, no rash or jaundice noted Neuro: awake, alert and oriented x 3. Normal gross motor function and fluent speech   Assessment: Encounter Diagnoses  Name Primary?   Personal history of colonic polyps Yes   Family history of colon cancer     She has some chronic stable digestive symptoms that seem likely related to benign constipation.  She manages it with a high-fiber diet.  She is in excellent health and I recommended she consider a  surveillance colonoscopy.  She was agreeable after discussion of procedure and risks.  The benefits and risks of the planned procedure were described in detail with the patient or (when appropriate) their health care proxy.  Risks were outlined as including, but not limited to, bleeding, infection, perforation, adverse medication reaction leading to cardiac or pulmonary decompensation, pancreatitis (if ERCP).  The limitation of incomplete mucosal visualization was also discussed.  No guarantees or warranties were given.    Thank you for the courtesy of this consult.  Please call me with any questions or concerns.  Charlie Pitter III  CC: Referring provider noted above

## 2023-02-06 ENCOUNTER — Ambulatory Visit (INDEPENDENT_AMBULATORY_CARE_PROVIDER_SITE_OTHER): Payer: Medicare PPO

## 2023-02-06 DIAGNOSIS — Z0000 Encounter for general adult medical examination without abnormal findings: Secondary | ICD-10-CM | POA: Diagnosis not present

## 2023-02-06 NOTE — Progress Notes (Signed)
Subjective:   Deborah Gardner is a 77 y.o. female who presents for Medicare Annual (Subsequent) preventive examination.  Visit Complete: Virtual  I connected with  LEANDER FAVRET on 02/06/23 by a audio enabled telemedicine application and verified that I am speaking with the correct person using two identifiers.  Patient Location: Home  Provider Location: Office/Clinic  I discussed the limitations of evaluation and management by telemedicine. The patient expressed understanding and agreed to proceed.  Vital Signs: Patient was unable to self-report vital signs via telehealth due to a lack of equipment at home.  Review of Systems     Cardiac Risk Factors include: advanced age (>1men, >59 women);dyslipidemia;hypertension     Objective:    Today's Vitals   02/06/23 1026  PainSc: 3    There is no height or weight on file to calculate BMI.     02/06/2023   10:38 AM 09/28/2022    8:05 AM 09/22/2022    2:17 PM 05/29/2022   11:35 AM 01/27/2022   10:32 AM 01/24/2021    2:43 PM 05/02/2018    3:32 PM  Advanced Directives  Does Patient Have a Medical Advance Directive? Yes Yes Yes Yes Yes Yes Yes  Type of Estate agent of [place];Living will Healthcare Power of [place];Living will Healthcare Power of [place];Living will  Healthcare Power of [place];Living will Healthcare Power of [place];Living will Healthcare Power of [place];Living will  Does patient want to make changes to medical advance directive?  No - Patient declined No - Patient declined No - Patient declined     Copy of Healthcare Power of Attorney in Chart? No - copy requested No - copy requested No - copy requested  No - copy requested No - copy requested No - copy requested  Would patient like information on creating a medical advance directive?    No - Patient declined       Current Medications (verified) Outpatient Encounter Medications as of 02/06/2023  Medication Sig   acetaminophen  (TYLENOL) 325 MG tablet Take 650 mg by mouth every 6 (six) hours as needed.   Calcium Carb-Cholecalciferol (CALCIUM + VITAMIN D3 PO) Take by mouth.   DULoxetine (CYMBALTA) 20 MG capsule TAKE 1 CAPSULE BY MOUTH EVERY DAY   ibuprofen (ADVIL) 200 MG tablet Take 200 mg by mouth every 6 (six) hours as needed. Takes 2 once or twice daily   loratadine (CLARITIN) 10 MG tablet TAKE 1 TABLET BY MOUTH EVERY DAY   omeprazole (PRILOSEC) 20 MG capsule TAKE 1 CAPSULE BY MOUTH DAILY BEFORE A MEAL   rosuvastatin (CRESTOR) 10 MG tablet Take 1 tablet (10 mg total) by mouth daily.   timolol (TIMOPTIC) 0.5 % ophthalmic solution Place 1 drop into both eyes 2 (two) times daily.   aspirin EC 81 MG tablet Take 81 mg by mouth daily. Swallow whole. (Patient not taking: Reported on 02/06/2023)   hydrochlorothiazide (HYDRODIURIL) 25 MG tablet Take 1 tablet (25 mg total) by mouth daily. (Patient not taking: Reported on 02/06/2023)   No facility-administered encounter medications on file as of 02/06/2023.    Allergies (verified) Atorvastatin, Lotrel [amlodipine besy-benazepril hcl], Molnupiravir, Sulfa antibiotics, and Amlodipine besylate   History: Past Medical History:  Diagnosis Date   Allergic rhinitis    Allergy    Arthritis    Cataract    bil   Colon polyps    Esophagitis    GERD (gastroesophageal reflux disease)    HH (hiatus hernia)    HTN (hypertension)  Hyperlipidemia    Increased pressure in the eye    OA (osteoarthritis)    hands   Ocular tension increased    at risk for glaucoma   Osteoporosis    Rectal pain    Past Surgical History:  Procedure Laterality Date   ABDOMINAL HYSTERECTOMY     still has ovaries   APPENDECTOMY     BREAST CYST ASPIRATION  07/2001   CARPAL TUNNEL RELEASE Right 05/29/2022   Procedure: RIGHT CARPAL TUNNEL RELEASE;  Surgeon: Betha Loa, MD;  Location: [facility];  Service: Orthopedics;  Laterality: Right;  Bier block   CARPAL TUNNEL RELEASE Left  09/28/2022   Procedure: LEFT CARPAL TUNNEL RELEASE;  Surgeon: Betha Loa, MD;  Location: [facility];  Service: Orthopedics;  Laterality: Left;  45 MIN   CATARACT EXTRACTION  03/2006   CYST EXCISION Left 09/28/2022   Procedure: LEFT LONG FINGER EXCISION ANNULAR LIGAMENT CYST;  Surgeon: Betha Loa, MD;  Location: [facility];  Service: Orthopedics;  Laterality: Left;   ESOPHAGOGASTRODUODENOSCOPY  05/2007   HEMORRHOID SURGERY     KNEE ARTHROSCOPY WITH MENISCAL REPAIR  2021   rectal sphincterotomy     Retinal laser surgery     TRIGGER FINGER RELEASE Left 09/28/2022   Procedure: RELEASE TRIGGER FINGER/A-1 PULLEY LEFT LONG FINGER;  Surgeon: Betha Loa, MD;  [facility];  Service: Orthopedics;  Laterality: Left;   UPPER GASTROINTESTINAL ENDOSCOPY     Family History  Problem Relation Age of Onset   Thyroid disease Mother    Depression Mother    Hypertension Mother    Osteoporosis Mother    Colon cancer Father    Pneumonia Father    Liver disease Brother        from alcohol   Alcohol abuse Brother        terminal    Alcohol abuse Brother    Breast cancer Maternal Aunt    Breast cancer Paternal Aunt    Breast cancer Paternal Aunt    Thyroid disease Other        half sister   Colon cancer Other        Aunt   Esophageal cancer Neg Hx    Stomach cancer Neg Hx    Rectal cancer Neg Hx    Social History   Socioeconomic History   Marital status: Widowed    Spouse name: Not on file   Number of children: 2   Years of education: Not on file   Highest education level: Not on file  Occupational History   Occupation: Retired    Associate Professor: RETIRED  Tobacco Use   Smoking status: Never   Smokeless tobacco: Never  Vaping Use   Vaping status: Never Used  Substance and Sexual Activity   Alcohol use: No    Alcohol/week: 0.0 standard drinks of alcohol   Drug use: No   Sexual activity: Not Currently    Partners: Male    Birth  control/protection: Surgical  Other Topics Concern   Not on file  Social History Narrative   Married      2 children      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 with husband,  and cares for her brother--who had alcohol and stroke      Cared for elderly mother for years (she passed in 2009)   Social Determinants of Health   Financial Resource Strain: Low Risk  (02/06/2023)   Overall Physicist, medical  Strain (CARDIA)    Difficulty of Paying Living Expenses: Not hard at all  Food Insecurity: No Food Insecurity (02/06/2023)   Hunger Vital Sign    Worried About Running Out of Food in the Last Year: Never true    Ran Out of Food in the Last Year: Never true  Transportation Needs: No Transportation Needs (02/06/2023)   PRAPARE - Administrator, Civil Service (Medical): No    Lack of Transportation (Non-Medical): No  Physical Activity: Inactive (02/06/2023)   Exercise Vital Sign    Days of Exercise per Week: 0 days    Minutes of Exercise per Session: 0 min  Stress: No Stress Concern Present (02/06/2023)   [employer] of Occupational Health - Occupational Stress Questionnaire    Feeling of Stress : Not at all  Social Connections: Moderately Isolated (02/06/2023)   Social Connection and Isolation Panel [NHANES]    Frequency of Communication with Friends and Family: More than three times a week    Frequency of Social Gatherings with Friends and Family: Once a week    Attends Religious Services: More than 4 times per year    Active Member of [employer] or Organizations: No    Attends Banker Meetings: Never    Marital Status: Widowed    Tobacco Counseling Counseling given: Not Answered   Clinical Intake:  Pre-visit preparation completed: Yes  Pain : 0-10 Pain Score: 3  Pain Type: Chronic pain Pain Location: Knee Pain Descriptors / Indicators: Aching, Dull Pain Onset: 1 to 4 weeks ago Pain Frequency: Constant     Nutritional Risks: None Diabetes:  No  How often do you need to have someone help you when you read instructions, pamphlets, or other written materials from your doctor or pharmacy?: 1 - Never  Interpreter Needed?: No  Information entered by :: NAllen LPN   Activities of Daily Living    02/06/2023   10:30 AM 09/28/2022    8:08 AM  In your present state of health, do you have any difficulty performing the following activities:  Hearing? 0 0  Vision? 0 0  Difficulty concentrating or making decisions? 0 0  Walking or climbing stairs? 1 0  Comment due to knee   Dressing or bathing? 0 0  Doing errands, shopping? 0   Preparing Food and eating ? N   Using the Toilet? N   In the past six months, have you accidently leaked urine? Y   Comment at night, wears depends   Do you have problems with loss of bowel control? N   Managing your Medications? N   Managing your Finances? N   Housekeeping or managing your Housekeeping? N     Patient Care Team: Tysinger, Kermit Balo, PA-C as PCP - General (Family Medicine)  Indicate any recent Medical Services you may have received from other than [facility] providers in the past year (date may be approximate).     Assessment:   This is a routine wellness examination for Lyriq.  Hearing/Vision screen Hearing Screening - Comments:: Denies hearing issues Vision Screening - Comments:: Regular eye exams, Dr. Lorin Picket  Dietary issues and exercise activities discussed:     Goals Addressed             This Visit's Progress    Patient Stated       02/06/2023, wants to lose weight       Depression Screen    02/06/2023   10:40 AM 10/25/2022   10:43 AM  05/22/2022   11:31 AM 01/27/2022   10:33 AM 09/27/2021    1:01 PM 01/24/2021    2:45 PM 05/02/2018    3:33 PM  PHQ 2/9 Scores  PHQ - 2 Score 0 0 0 0 0 0 0  PHQ- 9 Score 0   0  0 0    Fall Risk    02/06/2023   10:39 AM 10/25/2022   10:42 AM 05/22/2022   11:31 AM 01/27/2022   10:33 AM 09/27/2021    1:00 PM  Fall Risk   Falls in  the past year? 0 0 0 0 1  Number falls in past yr: 0 0 0 0 0  Injury with Fall? 0 0 0 0 0  Risk for fall due to : Medication side effect;Impaired mobility No Fall Risks No Fall Risks Medication side effect Impaired balance/gait  Follow up Falls prevention discussed;Falls evaluation completed Falls evaluation completed Falls evaluation completed Falls evaluation completed;Education provided;Falls prevention discussed Falls evaluation completed    MEDICARE RISK AT HOME:  Medicare Risk at Home - 02/06/23 1039     Any stairs in or around the home? No    If so, are there any without handrails? No    Home free of loose throw rugs in walkways, pet beds, electrical cords, etc? Yes    Adequate lighting in your home to reduce risk of falls? Yes    Life alert? No    Use of a cane, walker or w/c? No    Grab bars in the bathroom? Yes    Shower chair or bench in shower? No    Elevated toilet seat or a handicapped toilet? Yes             TIMED UP AND GO:  Was the test performed?  No    Cognitive Function:    01/24/2021    2:47 PM 05/02/2018    3:33 PM 04/26/2017    8:30 AM 04/25/2016   10:13 AM  MMSE - Mini Mental State Exam  Orientation to time 5 5 5 5   Orientation to Place 5 5 5 5   Registration 3 3 3 3   Attention/ Calculation 5 0 0 0  Recall 3 3 3 3   Language- name 2 objects  0 0 0  Language- repeat 1 1 1 1   Language- follow 3 step command  3 3 3   Language- read & follow direction  0 0 0  Write a sentence  0 0 0  Copy design  0 0 0  Total score  20 20 20         02/06/2023   10:40 AM 01/27/2022   10:36 AM  6CIT Screen  What Year? 0 points 0 points  What month? 0 points 0 points  What time? 0 points 0 points  Count back from 20 0 points 0 points  Months in reverse 0 points 0 points  Repeat phrase 2 points 4 points  Total Score 2 points 4 points    Immunizations Immunization History  Administered Date(s) Administered   Fluad Quad(high Dose 65+) 03/18/2020    Influenza Split 03/11/2011, 03/28/2012, 04/10/2016   Influenza Whole 05/13/2010   Influenza, High Dose Seasonal PF 05/01/2013, 05/18/2014, 09/10/2015, 04/10/2016, 04/12/2017, 05/07/2018, 04/02/2019, 04/20/2021   PFIZER(Purple Top)SARS-COV-2 Vaccination 09/22/2019, 10/13/2019   Pneumococcal Conjugate-13 12/02/2014   Pneumococcal Polysaccharide-23 03/14/2011   Td 08/30/2004   Zoster, Live 11/24/2009    TDAP status: Up to date  Flu Vaccine status: Up to date  Pneumococcal  vaccine status: Up to date  Covid-19 vaccine status: Information provided on how to obtain vaccines.   Qualifies for Shingles Vaccine? Yes   Zostavax completed Yes   Shingrix Completed?: No.    Education has been provided regarding the importance of this vaccine. Patient has been advised to call insurance company to determine out of pocket expense if they have not yet received this vaccine. Advised may also receive vaccine at local pharmacy or Health Dept. Verbalized acceptance and understanding.  Screening Tests Health Maintenance  Topic Date Due   Zoster Vaccines- Shingrix (1 of 2) 07/13/1993   COVID-19 Vaccine (3 - 2023-24 season) 03/10/2022   MAMMOGRAM  10/05/2022   INFLUENZA VACCINE  02/08/2023   Colonoscopy  06/27/2023   Medicare Annual Wellness (AWV)  02/06/2024   Pneumonia Vaccine 58+ Years old  Completed   DEXA SCAN  Completed   Hepatitis C Screening  Completed   HPV VACCINES  Aged Out   DTaP/Tdap/Td  Discontinued    Health Maintenance  Health Maintenance Due  Topic Date Due   Zoster Vaccines- Shingrix (1 of 2) 07/13/1993   COVID-19 Vaccine (3 - 2023-24 season) 03/10/2022   MAMMOGRAM  10/05/2022    Colorectal cancer screening: No longer required.   Mammogram status: Completed 10/04/2021. Repeat every 2 years  Bone Density status: Completed 03/15/2022.   Lung Cancer Screening: (Low Dose CT Chest recommended if Age 52-80 years, 20 pack-year currently smoking OR have quit w/in 15years.) does not  qualify.   Lung Cancer Screening Referral: no  Additional Screening:  Hepatitis C Screening: does qualify; Completed 04/25/2016  Vision Screening: Recommended annual ophthalmology exams for early detection of glaucoma and other disorders of the eye. Is the patient up to date with their annual eye exam?  Yes  Who is the provider or what is the name of the office in which the patient attends annual eye exams? Dr.Scott If pt is not established with a provider, would they like to be referred to a provider to establish care? No .   Dental Screening: Recommended annual dental exams for proper oral hygiene  Diabetic Foot Exam: n/a  Community Resource Referral / Chronic Care Management: CRR required this visit?  No   CCM required this visit?  No     Plan:     I have personally reviewed and noted the following in the patient's chart:   Medical and social history Use of alcohol, tobacco or illicit drugs  Current medications and supplements including opioid prescriptions. Patient is not currently taking opioid prescriptions. Functional ability and status Nutritional status Physical activity Advanced directives List of other physicians Hospitalizations, surgeries, and ER visits in previous 12 months Vitals Screenings to include cognitive, depression, and falls Referrals and appointments  In addition, I have reviewed and discussed with patient certain preventive protocols, quality metrics, and best practice recommendations. A written personalized care plan for preventive services as well as general preventive health recommendations were provided to patient.     Barb Merino, LPN   1/61/0960   After Visit Summary: (Pick Up) Due to this being a telephonic visit, with patients personalized plan was offered to patient and patient has requested to Pick up at office.  Nurse Notes: none

## 2023-02-06 NOTE — Patient Instructions (Signed)
[name] , Thank you for taking time to come for your Medicare Wellness Visit. I appreciate your ongoing commitment to your health goals. Please review the following plan we discussed and let me know if I can assist you in the future.   Referrals/Orders/Follow-Ups/Clinician Recommendations: none  This is a list of the screening recommended for you and due dates:  Health Maintenance  Topic Date Due   Zoster (Shingles) Vaccine (1 of 2) 07/13/1993   COVID-19 Vaccine (3 - 2023-24 season) 03/10/2022   Mammogram  10/05/2022   Flu Shot  02/08/2023   Colon Cancer Screening  06/27/2023   Medicare Annual Wellness Visit  02/06/2024   Pneumonia Vaccine  Completed   DEXA scan (bone density measurement)  Completed   Hepatitis C Screening  Completed   HPV Vaccine  Aged Out   DTaP/Tdap/Td vaccine  Discontinued    Advanced directives: (Copy Requested) Please bring a copy of your health care power of attorney and living will to the office to be added to your chart at your convenience.  Next Medicare Annual Wellness Visit scheduled for next year: Yes  Preventive Care 33 Years and Older, Female Preventive care refers to lifestyle choices and visits with your health care provider that can promote health and wellness. What does preventive care include? A yearly physical exam. This is also called an annual well check. Dental exams once or twice a year. Routine eye exams. Ask your health care provider how often you should have your eyes checked. Personal lifestyle choices, including: Daily care of your teeth and gums. Regular physical activity. Eating a healthy diet. Avoiding tobacco and drug use. Limiting alcohol use. Practicing safe sex. Taking low-dose aspirin every day. Taking vitamin and mineral supplements as recommended by your health care provider. What happens during an annual well check? The services and screenings done by your health care provider during your annual well check will  depend on your age, overall health, lifestyle risk factors, and family history of disease. Counseling  Your health care provider may ask you questions about your: Alcohol use. Tobacco use. Drug use. Emotional well-being. Home and relationship well-being. Sexual activity. Eating habits. History of falls. Memory and ability to understand (cognition). Work and work Astronomer. Reproductive health. Screening  You may have the following tests or measurements: Height, weight, and BMI. Blood pressure. Lipid and cholesterol levels. These may be checked every 5 years, or more frequently if you are over 19 years old. Skin check. Lung cancer screening. You may have this screening every year starting at age 46 if you have a 30-pack-year history of smoking and currently smoke or have quit within the past 15 years. Fecal occult blood test (FOBT) of the stool. You may have this test every year starting at age 21. Flexible sigmoidoscopy or colonoscopy. You may have a sigmoidoscopy every 5 years or a colonoscopy every 10 years starting at age 55. Hepatitis C blood test. Hepatitis B blood test. Sexually transmitted disease (STD) testing. Diabetes screening. This is done by checking your blood sugar (glucose) after you have not eaten for a while (fasting). You may have this done every 1-3 years. Bone density scan. This is done to screen for osteoporosis. You may have this done starting at age 38. Mammogram. This may be done every 1-2 years. Talk to your health care provider about how often you should have regular mammograms. Talk with your health care provider about your test results, treatment options, and if necessary, the need for more  tests. Vaccines  Your health care provider may recommend certain vaccines, such as: Influenza vaccine. This is recommended every year. Tetanus, diphtheria, and acellular pertussis (Tdap, Td) vaccine. You may need a Td booster every 10 years. Zoster vaccine. You may  need this after age 23. Pneumococcal 13-valent conjugate (PCV13) vaccine. One dose is recommended after age 39. Pneumococcal polysaccharide (PPSV23) vaccine. One dose is recommended after age 48. Talk to your health care provider about which screenings and vaccines you need and how often you need them. This information is not intended to replace advice given to you by your health care provider. Make sure you discuss any questions you have with your health care provider. Document Released: 07/23/2015 Document Revised: 03/15/2016 Document Reviewed: 04/27/2015 Elsevier Interactive Patient Education  2017 [employer].  Fall Prevention in the Home Falls can cause injuries. They can happen to people of all ages. There are many things you can do to make your home safe and to help prevent falls. What can I do on the outside of my home? Regularly fix the edges of walkways and driveways and fix any cracks. Remove anything that might make you trip as you walk through a door, such as a raised step or threshold. Trim any bushes or trees on the path to your home. Use bright outdoor lighting. Clear any walking paths of anything that might make someone trip, such as rocks or tools. Regularly check to see if handrails are loose or broken. Make sure that both sides of any steps have handrails. Any raised decks and porches should have guardrails on the edges. Have any leaves, snow, or ice cleared regularly. Use sand or salt on walking paths during winter. Clean up any spills in your garage right away. This includes oil or grease spills. What can I do in the bathroom? Use night lights. Install grab bars by the toilet and in the tub and shower. Do not use towel bars as grab bars. Use non-skid mats or decals in the tub or shower. If you need to sit down in the shower, use a plastic, non-slip stool. Keep the floor dry. Clean up any water that spills on the floor as soon as it happens. Remove soap buildup in  the tub or shower regularly. Attach bath mats securely with double-sided non-slip rug tape. Do not have throw rugs and other things on the floor that can make you trip. What can I do in the bedroom? Use night lights. Make sure that you have a light by your bed that is easy to reach. Do not use any sheets or blankets that are too big for your bed. They should not hang down onto the floor. Have a firm chair that has side arms. You can use this for support while you get dressed. Do not have throw rugs and other things on the floor that can make you trip. What can I do in the kitchen? Clean up any spills right away. Avoid walking on wet floors. Keep items that you use a lot in easy-to-reach places. If you need to reach something above you, use a strong step stool that has a grab bar. Keep electrical cords out of the way. Do not use floor polish or wax that makes floors slippery. If you must use wax, use non-skid floor wax. Do not have throw rugs and other things on the floor that can make you trip. What can I do with my stairs? Do not leave any items on the stairs. Make sure  that there are handrails on both sides of the stairs and use them. Fix handrails that are broken or loose. Make sure that handrails are as long as the stairways. Check any carpeting to make sure that it is firmly attached to the stairs. Fix any carpet that is loose or worn. Avoid having throw rugs at the top or bottom of the stairs. If you do have throw rugs, attach them to the floor with carpet tape. Make sure that you have a light switch at the top of the stairs and the bottom of the stairs. If you do not have them, ask someone to add them for you. What else can I do to help prevent falls? Wear shoes that: Do not have high heels. Have rubber bottoms. Are comfortable and fit you well. Are closed at the toe. Do not wear sandals. If you use a stepladder: Make sure that it is fully opened. Do not climb a closed  stepladder. Make sure that both sides of the stepladder are locked into place. Ask someone to hold it for you, if possible. Clearly mark and make sure that you can see: Any grab bars or handrails. First and last steps. Where the edge of each step is. Use tools that help you move around (mobility aids) if they are needed. These include: Canes. Walkers. Scooters. Crutches. Turn on the lights when you go into a dark area. Replace any light bulbs as soon as they burn out. Set up your furniture so you have a clear path. Avoid moving your furniture around. If any of your floors are uneven, fix them. If there are any pets around you, be aware of where they are. Review your medicines with your doctor. Some medicines can make you feel dizzy. This can increase your chance of falling. Ask your doctor what other things that you can do to help prevent falls. This information is not intended to replace advice given to you by your health care provider. Make sure you discuss any questions you have with your health care provider. Document Released: 04/22/2009 Document Revised: 12/02/2015 Document Reviewed: 07/31/2014 Elsevier Interactive Patient Education  2017 [employer].

## 2023-02-08 DIAGNOSIS — M25562 Pain in left knee: Secondary | ICD-10-CM | POA: Diagnosis not present

## 2023-02-21 DIAGNOSIS — S83242A Other tear of medial meniscus, current injury, left knee, initial encounter: Secondary | ICD-10-CM | POA: Diagnosis not present

## 2023-03-02 ENCOUNTER — Other Ambulatory Visit: Payer: Self-pay

## 2023-03-02 ENCOUNTER — Encounter (HOSPITAL_BASED_OUTPATIENT_CLINIC_OR_DEPARTMENT_OTHER): Payer: Self-pay | Admitting: Orthopedic Surgery

## 2023-03-07 ENCOUNTER — Encounter (HOSPITAL_BASED_OUTPATIENT_CLINIC_OR_DEPARTMENT_OTHER)
Admission: RE | Admit: 2023-03-07 | Discharge: 2023-03-07 | Disposition: A | Discharge status: Home / Self Care | Payer: Medicare PPO | Source: Ambulatory Visit | Attending: Orthopedic Surgery | Admitting: Orthopedic Surgery

## 2023-03-07 DIAGNOSIS — Z01812 Encounter for preprocedural laboratory examination: Secondary | ICD-10-CM | POA: Insufficient documentation

## 2023-03-07 DIAGNOSIS — Z01818 Encounter for other preprocedural examination: Secondary | ICD-10-CM | POA: Diagnosis present

## 2023-03-07 LAB — BASIC METABOLIC PANEL
Anion gap: 9 (ref 5–15)
BUN: 17 mg/dL (ref 8–23)
CO2: 26 mmol/L (ref 22–32)
Calcium: 9.6 mg/dL (ref 8.9–10.3)
Chloride: 102 mmol/L (ref 98–111)
Creatinine, Ser: 0.84 mg/dL (ref 0.44–1.00)
GFR, Estimated: >60 mL/min (ref >60)
Glucose, Bld: 101 mg/dL — ABNORMAL HIGH (ref 70–99)
Potassium: 3.8 mmol/L (ref 3.5–5.1)
Sodium: 137 mmol/L (ref 135–145)

## 2023-03-07 NOTE — Progress Notes (Signed)

## 2023-03-08 NOTE — H&P (View-Only) (Signed)
PREOPERATIVE H&P  Chief Complaint: left knee pain  HPI: Deborah Gardner is a 77 y.o. female who presents with left knee pain. Symptoms are rated as severe, and have been worsening.  This is significantly impairing activities of daily living.  She has elected for surgical management. MRI shows medial meniscus tear.   Past Medical History:  Diagnosis Date   Allergic rhinitis    Allergy    Arthritis    Cataract    bil   Colon polyps    Esophagitis    GERD (gastroesophageal reflux disease)    HH (hiatus hernia)    HTN (hypertension)    Hyperlipidemia    Increased pressure in the eye    OA (osteoarthritis)    hands   Ocular tension increased    at risk for glaucoma   Osteoporosis    Rectal pain    Past Surgical History:  Procedure Laterality Date   ABDOMINAL HYSTERECTOMY     still has ovaries   APPENDECTOMY     BREAST CYST ASPIRATION  07/2001   CARPAL TUNNEL RELEASE Right 05/29/2022   Procedure: RIGHT CARPAL TUNNEL RELEASE;  Surgeon: Betha Loa, MD;  Location: [facility];  Service: Orthopedics;  Laterality: Right;  Bier block   CARPAL TUNNEL RELEASE Left 09/28/2022   Procedure: LEFT CARPAL TUNNEL RELEASE;  Surgeon: Betha Loa, MD;  Location: [facility];  Service: Orthopedics;  Laterality: Left;  45 MIN   CATARACT EXTRACTION  03/2006   CYST EXCISION Left 09/28/2022   Procedure: LEFT LONG FINGER EXCISION ANNULAR LIGAMENT CYST;  Surgeon: Betha Loa, MD;  Location: [facility];  Service: Orthopedics;  Laterality: Left;   ESOPHAGOGASTRODUODENOSCOPY  05/2007   HEMORRHOID SURGERY     KNEE ARTHROSCOPY WITH MENISCAL REPAIR  2021   rectal sphincterotomy     Retinal laser surgery     TRIGGER FINGER RELEASE Left 09/28/2022   Procedure: RELEASE TRIGGER FINGER/A-1 PULLEY LEFT LONG FINGER;  Surgeon: Betha Loa, MD;  Location: [facility];  Service: Orthopedics;  Laterality: Left;   UPPER GASTROINTESTINAL ENDOSCOPY      Social History   Socioeconomic History   Marital status: Widowed    Spouse name: Not on file   Number of children: 2   Years of education: Not on file   Highest education level: Not on file  Occupational History   Occupation: Retired    Associate Professor: RETIRED  Tobacco Use   Smoking status: Never   Smokeless tobacco: Never  Vaping Use   Vaping status: Never Used  Substance and Sexual Activity   Alcohol use: No    Alcohol/week: 0.0 standard drinks of alcohol   Drug use: No   Sexual activity: Not Currently    Partners: Male    Birth control/protection: Surgical  Other Topics Concern   Not on file  Social History Narrative   Married      2 children      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 with husband,  and cares for her brother--who had alcohol and stroke      Cared for elderly mother for years (she passed in 2009)   Social Determinants of Health   Financial Resource Strain: Low Risk  (02/06/2023)   Overall Financial Resource Strain (CARDIA)    Difficulty of Paying Living Expenses: Not hard at all  Food Insecurity: No Food Insecurity (02/06/2023)   Hunger Vital Sign    Worried About Running Out of Food  in the Last Year: Never true    Ran Out of Food in the Last Year: Never true  Transportation Needs: No Transportation Needs (02/06/2023)   PRAPARE - Administrator, Civil Service (Medical): No    Lack of Transportation (Non-Medical): No  Physical Activity: Inactive (02/06/2023)   Exercise Vital Sign    Days of Exercise per Week: 0 days    Minutes of Exercise per Session: 0 min  Stress: No Stress Concern Present (02/06/2023)   [employer] of Occupational Health - Occupational Stress Questionnaire    Feeling of Stress : Not at all  Social Connections: Moderately Isolated (02/06/2023)   Social Connection and Isolation Panel [NHANES]    Frequency of Communication with Friends and Family: More than three times a week    Frequency of Social Gatherings with  Friends and Family: Once a week    Attends Religious Services: More than 4 times per year    Active Member of [employer] or Organizations: No    Attends Banker Meetings: Never    Marital Status: Widowed   Family History  Problem Relation Age of Onset   Thyroid disease Mother    Depression Mother    Hypertension Mother    Osteoporosis Mother    Colon cancer Father    Pneumonia Father    Liver disease Brother        from alcohol   Alcohol abuse Brother        terminal    Alcohol abuse Brother    Breast cancer Maternal Aunt    Breast cancer Paternal Aunt    Breast cancer Paternal Aunt    Thyroid disease Other        half sister   Colon cancer Other        Aunt   Esophageal cancer Neg Hx    Stomach cancer Neg Hx    Rectal cancer Neg Hx    Allergies  Allergen Reactions   Atorvastatin Other (See Comments)    Memory loss   Lotrel [Amlodipine Besy-Benazepril Hcl] Hives    Most likely from the benazepril    Molnupiravir     Sick feeling   Sulfa Antibiotics     Lip swelling   Amlodipine Besylate Rash   Prior to Admission medications   Medication Sig Start Date End Date Taking? Authorizing Provider  Calcium Carb-Cholecalciferol (CALCIUM + VITAMIN D3 PO) Take by mouth.   Yes [provider]  DULoxetine (CYMBALTA) 20 MG capsule TAKE 1 CAPSULE BY MOUTH EVERY DAY 12/20/22  Yes Tysinger, Kermit Balo, PA-C  hydrochlorothiazide (HYDRODIURIL) 25 MG tablet Take 1 tablet (25 mg total) by mouth daily. 10/26/22  Yes Tysinger, Kermit Balo, PA-C  loratadine (CLARITIN) 10 MG tablet TAKE 1 TABLET BY MOUTH EVERY DAY 08/31/22  Yes Tysinger, Kermit Balo, PA-C  omeprazole (PRILOSEC) 20 MG capsule TAKE 1 CAPSULE BY MOUTH DAILY BEFORE A MEAL 11/27/22  Yes Tysinger, Kermit Balo, PA-C  rosuvastatin (CRESTOR) 10 MG tablet Take 1 tablet (10 mg total) by mouth daily. 10/26/22  Yes Tysinger, Kermit Balo, PA-C  acetaminophen (TYLENOL) 325 MG tablet Take 650 mg by mouth every 6 (six) hours as needed.    [provider]  aspirin EC 81 MG tablet Take 81 mg by mouth daily. Swallow whole.    [provider]  ibuprofen (ADVIL) 200 MG tablet Take 200 mg by mouth every 6 (six) hours as needed. Takes 2 once or twice daily    [provider]  timolol (TIMOPTIC) 0.5 % ophthalmic solution Place 1 drop into both eyes 2 (two) times daily. 01/24/13   [provider]     Positive ROS: All other systems have been reviewed and were otherwise negative with the exception of those mentioned in the HPI and as above.  Physical Exam: General: Alert, no acute distress Cardiovascular: No pedal edema Respiratory: No cyanosis, no use of accessory musculature GI: No organomegaly, abdomen is soft and non-tender Skin: No lesions in the area of chief complaint Neurologic: Sensation intact distally Psychiatric: Patient is competent for consent with normal mood and affect Lymphatic: No axillary or cervical lymphadenopathy  MUSCULOSKELETAL: left knee painful arc of motion. Medial joint line tenderness. Some patellar grind.  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     Armida Sans, PA-C   03/08/2023 2:21 PM

## 2023-03-08 NOTE — H&P (Signed)
PREOPERATIVE H&P  Chief Complaint: left knee pain  HPI: Deborah Gardner is a 77 y.o. female who presents with left knee pain. Symptoms are rated as severe, and have been worsening.  This is significantly impairing activities of daily living.  She has elected for surgical management. MRI shows medial meniscus tear.   Past Medical History:  Diagnosis Date   Allergic rhinitis    Allergy    Arthritis    Cataract    bil   Colon polyps    Esophagitis    GERD (gastroesophageal reflux disease)    HH (hiatus hernia)    HTN (hypertension)    Hyperlipidemia    Increased pressure in the eye    OA (osteoarthritis)    hands   Ocular tension increased    at risk for glaucoma   Osteoporosis    Rectal pain    Past Surgical History:  Procedure Laterality Date   ABDOMINAL HYSTERECTOMY     still has ovaries   APPENDECTOMY     BREAST CYST ASPIRATION  07/2001   CARPAL TUNNEL RELEASE Right 05/29/2022   Procedure: RIGHT CARPAL TUNNEL RELEASE;  Surgeon: Betha Loa, MD;  Location: [facility];  Service: Orthopedics;  Laterality: Right;  Bier block   CARPAL TUNNEL RELEASE Left 09/28/2022   Procedure: LEFT CARPAL TUNNEL RELEASE;  Surgeon: Betha Loa, MD;  Location: [facility];  Service: Orthopedics;  Laterality: Left;  45 MIN   CATARACT EXTRACTION  03/2006   CYST EXCISION Left 09/28/2022   Procedure: LEFT LONG FINGER EXCISION ANNULAR LIGAMENT CYST;  Surgeon: Betha Loa, MD;  Location: [facility];  Service: Orthopedics;  Laterality: Left;   ESOPHAGOGASTRODUODENOSCOPY  05/2007   HEMORRHOID SURGERY     KNEE ARTHROSCOPY WITH MENISCAL REPAIR  2021   rectal sphincterotomy     Retinal laser surgery     TRIGGER FINGER RELEASE Left 09/28/2022   Procedure: RELEASE TRIGGER FINGER/A-1 PULLEY LEFT LONG FINGER;  Surgeon: Betha Loa, MD;  [facility];  Service: Orthopedics;  Laterality: Left;   UPPER GASTROINTESTINAL ENDOSCOPY      Social History   Socioeconomic History   Marital status: Widowed    Spouse name: Not on file   Number of children: 2   Years of education: Not on file   Highest education level: Not on file  Occupational History   Occupation: Retired    Associate Professor: RETIRED  Tobacco Use   Smoking status: Never   Smokeless tobacco: Never  Vaping Use   Vaping status: Never Used  Substance and Sexual Activity   Alcohol use: No    Alcohol/week: 0.0 standard drinks of alcohol   Drug use: No   Sexual activity: Not Currently    Partners: Male    Birth control/protection: Surgical  Other Topics Concern   Not on file  Social History Narrative   Married      2 children      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 with husband,  and cares for her brother--who had alcohol and stroke      Cared for elderly mother for years (she passed in 2009)   Social Determinants of Health   Financial Resource Strain: Low Risk  (02/06/2023)   Overall Financial Resource Strain (CARDIA)    Difficulty of Paying Living Expenses: Not hard at all  Food Insecurity: No Food Insecurity (02/06/2023)   Hunger Vital Sign    Worried About Running Out of Food  in the Last Year: Never true    Ran Out of Food in the Last Year: Never true  Transportation Needs: No Transportation Needs (02/06/2023)   PRAPARE - Administrator, Civil Service (Medical): No    Lack of Transportation (Non-Medical): No  Physical Activity: Inactive (02/06/2023)   Exercise Vital Sign    Days of Exercise per Week: 0 days    Minutes of Exercise per Session: 0 min  Stress: No Stress Concern Present (02/06/2023)   [employer] of Occupational Health - Occupational Stress Questionnaire    Feeling of Stress : Not at all  Social Connections: Moderately Isolated (02/06/2023)   Social Connection and Isolation Panel [NHANES]    Frequency of Communication with Friends and Family: More than three times a week    Frequency of Social Gatherings with  Friends and Family: Once a week    Attends Religious Services: More than 4 times per year    Active Member of [employer] or Organizations: No    Attends Banker Meetings: Never    Marital Status: Widowed   Family History  Problem Relation Age of Onset   Thyroid disease Mother    Depression Mother    Hypertension Mother    Osteoporosis Mother    Colon cancer Father    Pneumonia Father    Liver disease Brother        from alcohol   Alcohol abuse Brother        terminal    Alcohol abuse Brother    Breast cancer Maternal Aunt    Breast cancer Paternal Aunt    Breast cancer Paternal Aunt    Thyroid disease Other        half sister   Colon cancer Other        Aunt   Esophageal cancer Neg Hx    Stomach cancer Neg Hx    Rectal cancer Neg Hx    Allergies  Allergen Reactions   Atorvastatin Other (See Comments)    Memory loss   Lotrel [Amlodipine Besy-Benazepril Hcl] Hives    Most likely from the benazepril    Molnupiravir     Sick feeling   Sulfa Antibiotics     Lip swelling   Amlodipine Besylate Rash   Prior to Admission medications   Medication Sig Start Date End Date Taking? Authorizing Provider  Calcium Carb-Cholecalciferol (CALCIUM + VITAMIN D3 PO) Take by mouth.   Yes [provider]  DULoxetine (CYMBALTA) 20 MG capsule TAKE 1 CAPSULE BY MOUTH EVERY DAY 12/20/22  Yes Tysinger, Kermit Balo, PA-C  hydrochlorothiazide (HYDRODIURIL) 25 MG tablet Take 1 tablet (25 mg total) by mouth daily. 10/26/22  Yes Tysinger, Kermit Balo, PA-C  loratadine (CLARITIN) 10 MG tablet TAKE 1 TABLET BY MOUTH EVERY DAY 08/31/22  Yes Tysinger, Kermit Balo, PA-C  omeprazole (PRILOSEC) 20 MG capsule TAKE 1 CAPSULE BY MOUTH DAILY BEFORE A MEAL 11/27/22  Yes Tysinger, Kermit Balo, PA-C  rosuvastatin (CRESTOR) 10 MG tablet Take 1 tablet (10 mg total) by mouth daily. 10/26/22  Yes Tysinger, Kermit Balo, PA-C  acetaminophen (TYLENOL) 325 MG tablet Take 650 mg by mouth every 6 (six) hours as needed.    [provider]  aspirin EC 81 MG tablet Take 81 mg by mouth daily. Swallow whole.    [provider]  ibuprofen (ADVIL) 200 MG tablet Take 200 mg by mouth every 6 (six) hours as needed. Takes 2 once or twice daily    [provider]  timolol (TIMOPTIC) 0.5 % ophthalmic solution Place 1 drop into both eyes 2 (two) times daily. 01/24/13   [provider]     Positive ROS: All other systems have been reviewed and were otherwise negative with the exception of those mentioned in the HPI and as above.  Physical Exam: General: Alert, no acute distress Cardiovascular: No pedal edema Respiratory: No cyanosis, no use of accessory musculature GI: No organomegaly, abdomen is soft and non-tender Skin: No lesions in the area of chief complaint Neurologic: Sensation intact distally Psychiatric: Patient is competent for consent with normal mood and affect Lymphatic: No axillary or cervical lymphadenopathy  MUSCULOSKELETAL: left knee painful arc of motion. Medial joint line tenderness. Some patellar grind.  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     Armida Sans, PA-C   03/08/2023 2:21 PM

## 2023-03-13 ENCOUNTER — Ambulatory Visit (HOSPITAL_BASED_OUTPATIENT_CLINIC_OR_DEPARTMENT_OTHER): Admission: RE | Admit: 2023-03-13 | Payer: Medicare PPO | Source: Ambulatory Visit | Admitting: Orthopedic Surgery

## 2023-03-13 DIAGNOSIS — Z01818 Encounter for other preprocedural examination: Secondary | ICD-10-CM

## 2023-03-13 SURGERY — CHONDROPLASTY
Anesthesia: Choice | Site: Knee | Laterality: Left

## 2023-03-19 ENCOUNTER — Encounter (HOSPITAL_BASED_OUTPATIENT_CLINIC_OR_DEPARTMENT_OTHER): Payer: Self-pay | Admitting: Orthopedic Surgery

## 2023-03-19 ENCOUNTER — Other Ambulatory Visit: Payer: Self-pay

## 2023-03-20 ENCOUNTER — Ambulatory Visit: Payer: Medicare PPO | Admitting: Gastroenterology

## 2023-03-27 ENCOUNTER — Encounter (HOSPITAL_BASED_OUTPATIENT_CLINIC_OR_DEPARTMENT_OTHER): Payer: Self-pay | Admitting: Orthopedic Surgery

## 2023-03-27 ENCOUNTER — Encounter (HOSPITAL_BASED_OUTPATIENT_CLINIC_OR_DEPARTMENT_OTHER)
Admission: RE | Disposition: A | Discharge status: Home / Self Care | Payer: Self-pay | Source: Ambulatory Visit | Attending: Orthopedic Surgery

## 2023-03-27 ENCOUNTER — Ambulatory Visit (HOSPITAL_BASED_OUTPATIENT_CLINIC_OR_DEPARTMENT_OTHER)
Admission: RE | Admit: 2023-03-27 | Discharge: 2023-03-27 | Disposition: A | Discharge status: Home / Self Care | Payer: Medicare PPO | Source: Ambulatory Visit | Attending: Orthopedic Surgery | Admitting: Orthopedic Surgery

## 2023-03-27 ENCOUNTER — Ambulatory Visit (HOSPITAL_BASED_OUTPATIENT_CLINIC_OR_DEPARTMENT_OTHER): Payer: Medicare PPO | Admitting: Anesthesiology

## 2023-03-27 DIAGNOSIS — S83232A Complex tear of medial meniscus, current injury, left knee, initial encounter: Secondary | ICD-10-CM | POA: Insufficient documentation

## 2023-03-27 DIAGNOSIS — S83242A Other tear of medial meniscus, current injury, left knee, initial encounter: Secondary | ICD-10-CM

## 2023-03-27 DIAGNOSIS — K449 Diaphragmatic hernia without obstruction or gangrene: Secondary | ICD-10-CM

## 2023-03-27 DIAGNOSIS — E785 Hyperlipidemia, unspecified: Secondary | ICD-10-CM | POA: Diagnosis not present

## 2023-03-27 DIAGNOSIS — E039 Hypothyroidism, unspecified: Secondary | ICD-10-CM | POA: Diagnosis not present

## 2023-03-27 DIAGNOSIS — M1712 Unilateral primary osteoarthritis, left knee: Secondary | ICD-10-CM | POA: Insufficient documentation

## 2023-03-27 DIAGNOSIS — K219 Gastro-esophageal reflux disease without esophagitis: Secondary | ICD-10-CM | POA: Insufficient documentation

## 2023-03-27 DIAGNOSIS — X58XXXA Exposure to other specified factors, initial encounter: Secondary | ICD-10-CM | POA: Diagnosis not present

## 2023-03-27 DIAGNOSIS — I10 Essential (primary) hypertension: Secondary | ICD-10-CM | POA: Diagnosis not present

## 2023-03-27 HISTORY — PX: KNEE ARTHROSCOPY WITH MEDIAL MENISECTOMY: SHX5651

## 2023-03-27 SURGERY — ARTHROSCOPY, KNEE, WITH MEDIAL MENISCECTOMY
Anesthesia: General | Site: Knee | Laterality: Left

## 2023-03-27 MED ORDER — PHENYLEPHRINE HCL-NACL 20-0.9 MG/250ML-% IV SOLN
INTRAVENOUS | Status: DC | PRN
  Administered 2023-03-27: 110 ug/min via INTRAVENOUS

## 2023-03-27 MED ORDER — PROPOFOL 10 MG/ML IV BOLUS
INTRAVENOUS | Status: DC | PRN
  Administered 2023-03-27: 30 mg via INTRAVENOUS
  Administered 2023-03-27: 130 mg via INTRAVENOUS

## 2023-03-27 MED ORDER — DEXMEDETOMIDINE HCL IN NACL 80 MCG/20ML IV SOLN
INTRAVENOUS | Status: DC | PRN
Start: 2023-03-27 — End: 2023-03-27
  Administered 2023-03-27 (×2): 8 ug via INTRAVENOUS

## 2023-03-27 MED ORDER — ACETAMINOPHEN 160 MG/5ML PO SOLN: 325.0000 mg | ORAL | Status: DC | PRN

## 2023-03-27 MED ORDER — EPHEDRINE SULFATE-NACL 50-0.9 MG/10ML-% IV SOSY
PREFILLED_SYRINGE | INTRAVENOUS | Status: DC | PRN
  Administered 2023-03-27 (×3): 5 mg via INTRAVENOUS

## 2023-03-27 MED ORDER — PHENYLEPHRINE 80 MCG/ML (10ML) SYRINGE FOR IV PUSH (FOR BLOOD PRESSURE SUPPORT)
PREFILLED_SYRINGE | INTRAVENOUS | Status: DC | PRN
  Administered 2023-03-27: 160 ug via INTRAVENOUS
  Administered 2023-03-27: 80 ug via INTRAVENOUS

## 2023-03-27 MED ORDER — SODIUM CHLORIDE 0.9 % IR SOLN
Status: DC | PRN
  Administered 2023-03-27: 6000 mL

## 2023-03-27 MED ORDER — LACTATED RINGERS IV SOLN: INTRAVENOUS | Status: DC

## 2023-03-27 MED ORDER — ONDANSETRON HCL 4 MG/2ML IJ SOLN: 4.0000 mg | Freq: Once | INTRAMUSCULAR | Status: DC | PRN

## 2023-03-27 MED ORDER — LIDOCAINE 2% (20 MG/ML) 5 ML SYRINGE
INTRAMUSCULAR | Status: DC | PRN
  Administered 2023-03-27: 60 mg via INTRAVENOUS

## 2023-03-27 MED ORDER — ACETAMINOPHEN 325 MG PO TABS: 325.0000 mg | ORAL_TABLET | ORAL | Status: DC | PRN

## 2023-03-27 MED ORDER — TRAMADOL HCL 50 MG PO TABS: 50.0000 mg | ORAL_TABLET | Freq: Three times a day (TID) | ORAL | 0 refills | Status: DC | PRN

## 2023-03-27 MED ORDER — BUPIVACAINE HCL (PF) 0.5 % IJ SOLN
INTRAMUSCULAR | Status: DC | PRN
  Administered 2023-03-27: 30 mL via INTRA_ARTICULAR

## 2023-03-27 MED ORDER — DEXAMETHASONE SODIUM PHOSPHATE 10 MG/ML IJ SOLN
INTRAMUSCULAR | Status: DC | PRN
  Administered 2023-03-27: 5 mg via INTRAVENOUS

## 2023-03-27 MED ORDER — OXYCODONE HCL 5 MG PO TABS: 5.0000 mg | ORAL_TABLET | Freq: Once | ORAL | Status: DC | PRN

## 2023-03-27 MED ORDER — OXYCODONE HCL 5 MG/5ML PO SOLN: 5.0000 mg | Freq: Once | ORAL | Status: DC | PRN

## 2023-03-27 MED ORDER — FENTANYL CITRATE (PF) 100 MCG/2ML IJ SOLN: 25.0000 ug | INTRAMUSCULAR | Status: DC | PRN

## 2023-03-27 MED ORDER — ONDANSETRON HCL 4 MG/2ML IJ SOLN
INTRAMUSCULAR | Status: DC | PRN
  Administered 2023-03-27: 4 mg via INTRAVENOUS

## 2023-03-27 MED ORDER — FENTANYL CITRATE (PF) 100 MCG/2ML IJ SOLN
INTRAMUSCULAR | Status: AC
  Filled 2023-03-27: qty 2

## 2023-03-27 MED ORDER — CEFAZOLIN SODIUM-DEXTROSE 2-3 GM-%(50ML) IV SOLR
INTRAVENOUS | Status: DC | PRN
Start: 2023-03-27 — End: 2023-03-27
  Administered 2023-03-27: 2 g via INTRAVENOUS

## 2023-03-27 MED ORDER — FENTANYL CITRATE (PF) 250 MCG/5ML IJ SOLN
INTRAMUSCULAR | Status: DC | PRN
  Administered 2023-03-27: 25 ug via INTRAVENOUS
  Administered 2023-03-27: 50 ug via INTRAVENOUS
  Administered 2023-03-27: 25 ug via INTRAVENOUS

## 2023-03-27 MED ORDER — PROPOFOL 500 MG/50ML IV EMUL
INTRAVENOUS | Status: AC
  Filled 2023-03-27: qty 50

## 2023-03-27 MED ORDER — EPHEDRINE 5 MG/ML INJ
INTRAVENOUS | Status: AC
  Filled 2023-03-27: qty 5

## 2023-03-27 MED ORDER — MEPERIDINE HCL 25 MG/ML IJ SOLN: 6.2500 mg | INTRAMUSCULAR | Status: DC | PRN

## 2023-03-27 SURGICAL SUPPLY — 33 items
BNDG CMPR 6 X 5 YARDS HK CLSR (GAUZE/BANDAGES/DRESSINGS) ×1
BNDG ELASTIC 6INX 5YD STR LF (GAUZE/BANDAGES/DRESSINGS) ×1 IMPLANT
CLSR STERI-STRIP ANTIMIC 1/2X4 (GAUZE/BANDAGES/DRESSINGS) ×1 IMPLANT
CUTTER TENSIONER SUT 2-0 0 FBW (INSTRUMENTS) IMPLANT
DISSECTOR 3.8MM X 13CM (MISCELLANEOUS) ×1 IMPLANT
DISSECTOR 4.0MM X 13CM (MISCELLANEOUS) IMPLANT
DRAPE ARTHROSCOPY W/POUCH 90 (DRAPES) ×1 IMPLANT
DRAPE IMP U-DRAPE 54X76 (DRAPES) ×1 IMPLANT
DURAPREP 26ML APPLICATOR (WOUND CARE) ×1 IMPLANT
ELECT MENISCUS 165MM 90D (ELECTRODE) IMPLANT
ELECT REM PT RETURN 9FT ADLT (ELECTROSURGICAL) ×1
ELECTRODE REM PT RTRN 9FT ADLT (ELECTROSURGICAL) IMPLANT
GAUZE SPONGE 4X4 12PLY STRL (GAUZE/BANDAGES/DRESSINGS) ×1 IMPLANT
GLOVE BIO SURGEON STRL SZ7 (GLOVE) ×1 IMPLANT
GLOVE BIOGEL PI IND STRL 7.0 (GLOVE) ×1 IMPLANT
GLOVE BIOGEL PI IND STRL 8 (GLOVE) ×2 IMPLANT
GLOVE ORTHO TXT STRL SZ7.5 (GLOVE) ×1 IMPLANT
GLOVE SURG SS PI 7.0 STRL IVOR (GLOVE) IMPLANT
GOWN STRL REUS W/ TWL LRG LVL3 (GOWN DISPOSABLE) ×1 IMPLANT
GOWN STRL REUS W/ TWL XL LVL3 (GOWN DISPOSABLE) ×2 IMPLANT
GOWN STRL REUS W/TWL LRG LVL3 (GOWN DISPOSABLE) ×1
GOWN STRL REUS W/TWL XL LVL3 (GOWN DISPOSABLE) ×3
IV NS IRRIG 3000ML ARTHROMATIC (IV SOLUTION) ×2 IMPLANT
MANIFOLD NEPTUNE II (INSTRUMENTS) ×1 IMPLANT
PACK ARTHROSCOPY DSU (CUSTOM PROCEDURE TRAY) ×1 IMPLANT
PACK BASIN DAY SURGERY FS (CUSTOM PROCEDURE TRAY) ×1 IMPLANT
PENCIL SMOKE EVACUATOR (MISCELLANEOUS) IMPLANT
SLEEVE SCD COMPRESS KNEE MED (STOCKING) IMPLANT
SUT MNCRL AB 4-0 PS2 18 (SUTURE) ×1 IMPLANT
TOWEL GREEN STERILE FF (TOWEL DISPOSABLE) ×1 IMPLANT
TUBING ARTHROSCOPY IRRIG 16FT (MISCELLANEOUS) ×1 IMPLANT
WAND ABLATOR APOLLO I90 (BUR) IMPLANT
WRAP KNEE MAXI GEL POST OP (GAUZE/BANDAGES/DRESSINGS) ×1 IMPLANT

## 2023-03-27 NOTE — Interval H&P Note (Signed)
History and Physical Interval Note:  03/27/2023 10:11 AM  Deborah Gardner  has presented today for surgery, with the diagnosis of LEFT KNEE MEDIAL  MENISCUS TEAR.  The various methods of treatment have been discussed with the patient and family. After consideration of risks, benefits and other options for treatment, the patient has consented to  Procedure(s): KNEE ARTHROSCOPY WITH MEDIAL MENISECTOMY (Left) as a surgical intervention.  The patient's history has been reviewed, patient examined, no change in status, stable for surgery.  I have reviewed the patient's chart and labs.  Questions were answered to the patient's satisfaction.     Eulas Post

## 2023-03-27 NOTE — Anesthesia Postprocedure Evaluation (Signed)
Anesthesia Post Note  Patient: Deborah Gardner  Procedure(s) Performed: KNEE ARTHROSCOPY WITH MEDIAL MENISECTOMY (Left: Knee)     Patient location during evaluation: PACU Anesthesia Type: General Level of consciousness: awake and alert Pain management: pain level controlled Vital Signs Assessment: post-procedure vital signs reviewed and stable Respiratory status: spontaneous breathing, nonlabored ventilation, respiratory function stable and patient connected to nasal cannula oxygen Cardiovascular status: blood pressure returned to baseline and stable Postop Assessment: no apparent nausea or vomiting Anesthetic complications: no   No notable events documented.  Last Vitals:  Vitals:   03/27/23 1315 03/27/23 1347  BP: (!) 149/66 (!) 161/80  Pulse: 60 60  Resp: 11 12  Temp:  (!) 36.1 C  SpO2: 99% 98%    Last Pain:  Vitals:   03/27/23 1347  TempSrc: Temporal  PainSc: 2                  Deante Blough

## 2023-03-27 NOTE — Op Note (Signed)
03/27/2023  12:27 PM  PATIENT:  [name]    PRE-OPERATIVE DIAGNOSIS:  LEFT KNEE MEDIAL  MENISCUS TEAR  POST-OPERATIVE DIAGNOSIS:  Same  PROCEDURE: Left knee arthroscopy with medial meniscectomy  SURGEON:  Eulas Post, MD  PHYSICIAN ASSISTANT: Janine Ores, PA-C, present and scrubbed throughout the case, critical for completion in a timely fashion, and for retraction, instrumentation, and closure.  Second Assistant: Roswell Nickel, PA-C  ANESTHESIA:   General  PREOPERATIVE INDICATIONS:  [name] is a  77 y.o. female with a diagnosis of LEFT KNEE MEDIAL  MENISCUS TEAR who failed conservative measures and elected for surgical management.    The risks benefits and alternatives were discussed with the patient preoperatively including but not limited to the risks of infection, bleeding, nerve injury, cardiopulmonary complications, the need for revision surgery, among others, and the patient was willing to proceed.  ESTIMATED BLOOD LOSS: Minimal  OPERATIVE IMPLANTS: None  OPERATIVE FINDINGS: She had a complex medial meniscus tear from the body that extended around to the root.  The patellofemoral joint had grade 3 and grade 4 chondral changes, particularly superiorly.  The lateral compartment was in reasonably good condition but there were areas of chondral fragments that were floating around, some in the popliteal hiatus, some in the suprapatellar pouch, and some in the medial compartment.  The medial cartilage itself did not look terrible, there was probably some extensive grade 2 changes, there was 1 area of grade 4 chondral changes with full-thickness fragmentation on the medial aspect of the femoral condyle at the very rim.  Definitely there was a sizable meniscus tear.  The tissue quality was fairly poor.  OPERATIVE PROCEDURE: The patient was brought to the operating room and placed in supine position.  General anesthesia was administered.  IV antibiotics were given.   The left lower extremity was prepped and draped in usual sterile fashion.  Timeout performed.  I injected the portal sites, and interestingly it was fairly challenging to get her adequate anesthesia, she was reacting to instrumentation, ultimately she was able to the placed deep enough, that I was able to perform a diagnostic arthroscopy with the above named findings.  I used the arthroscopic shaver to debride the medial meniscus back to a stable configuration.  There is a small area of the anterior horn lateral meniscus which I also debrided, but it did not seem structurally substantial.  The ACL was intact, I did perform a chondroplasty of the medial femoral condyle, and to a lesser degree the undersurface of the patella.  There was really no loose cartilage under the patella, just vast areas of delaminated exposed chondral bone.  The knee was drained, injected, the portals closed with Monocryl followed by Steri-Strips and sterile gauze.  She was awakened and returned to the PACU in stable and satisfactory condition.  There were no complications and she tolerated the procedure well.

## 2023-03-27 NOTE — Transfer of Care (Signed)
Immediate Anesthesia Transfer of Care Note  Patient: Deborah Gardner  Procedure(s) Performed: KNEE ARTHROSCOPY WITH MEDIAL MENISECTOMY (Left: Knee)  Patient Location: PACU  Anesthesia Type:General  Level of Consciousness: drowsy, patient cooperative, and responds to stimulation  Airway & Oxygen Therapy: Patient Spontanous Breathing and Patient connected to face mask oxygen  Post-op Assessment: Report given to RN, Post -op Vital signs reviewed and stable, and Patient moving all extremities X 4  Post vital signs: Reviewed and stable  Last Vitals:  Vitals Value Taken Time  BP 190/84 03/27/23 1236  Temp 36.1 C 03/27/23 1236  Pulse 58 03/27/23 1242  Resp 10 03/27/23 1242  SpO2 100 % 03/27/23 1242  Vitals shown include unfiled device data.  Last Pain:  Vitals:   03/27/23 0911  TempSrc: Oral  PainSc: 0-No pain      Patients Stated Pain Goal: 3 (03/27/23 0911)  Complications: No notable events documented.

## 2023-03-27 NOTE — Anesthesia Procedure Notes (Signed)
Procedure Name: LMA Insertion Date/Time: 03/27/2023 11:47 AM  Performed by: Aundria Rud, CRNAPre-anesthesia Checklist: Patient identified, Emergency Drugs available, Suction available and Patient being monitored Patient Re-evaluated:Patient Re-evaluated prior to induction Oxygen Delivery Method: Circle System Utilized Preoxygenation: Pre-oxygenation with 100% oxygen Induction Type: IV induction Ventilation: Mask ventilation without difficulty LMA: LMA inserted LMA Size: 3.0 Number of attempts: 1 Airway Equipment and Method: Bite block Placement Confirmation: positive ETCO2 Tube secured with: Tape Dental Injury: Teeth and Oropharynx as per pre-operative assessment

## 2023-03-27 NOTE — Discharge Instructions (Addendum)
Diet: As you were doing prior to hospitalization   Shower:  May shower but keep the wounds dry, use an occlusive plastic wrap, NO SOAKING IN TUB.  If the bandage gets wet, change with a clean dry gauze.  If you have a splint on, leave the splint in place and keep the splint dry with a plastic bag.  Dressing:  You may change your dressing 3-5 days after surgery, unless you have a splint.  If you have a splint, then just leave the splint in place and we will change your bandages during your first follow-up appointment.    If you had hand or foot surgery, we will plan to remove your stitches in about 2 weeks in the office.  For all other surgeries, there are sticky tapes (steri-strips) on your wounds and all the stitches are absorbable.  Leave the steri-strips in place when changing your dressings, they will peel off with time, usually 2-3 weeks.  Activity:  Increase activity slowly as tolerated, but follow the weight bearing instructions below.  The rules on driving is that you can not be taking narcotics while you drive, and you must feel in control of the vehicle.    Weight Bearing:   weight bearing as tolerated  To prevent constipation: you may use a stool softener such as -  Colace (over the counter) 100 mg by mouth twice a day  Drink plenty of fluids (prune juice may be helpful) and high fiber foods Miralax (over the counter) for constipation as needed.    Itching:  If you experience itching with your medications, try taking only a single pain pill, or even half a pain pill at a time.  You may take up to 10 pain pills per day, and you can also use benadryl over the counter for itching or also to help with sleep.   Precautions:  If you experience chest pain or shortness of breath - call 911 immediately for transfer to the hospital emergency department!!  If you develop a fever greater that 101 F, purulent drainage from wound, increased redness or drainage from wound, or calf pain -- Call the  office at [phone]                                                Follow- Up Appointment:  Please call for an appointment to be seen in 2 weeks [place] - [phone]     Post Anesthesia Home Care Instructions  Activity: Get plenty of rest for the remainder of the day. A responsible individual must stay with you for 24 hours following the procedure.  For the next 24 hours, DO NOT: -Drive a car -Advertising copywriter -Drink alcoholic beverages -Take any medication unless instructed by your physician -Make any legal decisions or sign important papers.  Meals: Start with liquid foods such as gelatin or soup. Progress to regular foods as tolerated. Avoid greasy, spicy, heavy foods. If nausea and/or vomiting occur, drink only clear liquids until the nausea and/or vomiting subsides. Call your physician if vomiting continues.  Special Instructions/Symptoms: Your throat may feel dry or sore from the anesthesia or the breathing tube placed in your throat during surgery. If this causes discomfort, gargle with warm salt water. The discomfort should disappear within 24 hours.  If you had a scopolamine patch placed behind your ear for the management of  post- operative nausea and/or vomiting:  1. The medication in the patch is effective for 72 hours, after which it should be removed.  Wrap patch in a tissue and discard in the trash. Wash hands thoroughly with soap and water. 2. You may remove the patch earlier than 72 hours if you experience unpleasant side effects which may include dry mouth, dizziness or visual disturbances. 3. Avoid touching the patch. Wash your hands with soap and water after contact with the patch.

## 2023-03-27 NOTE — Anesthesia Preprocedure Evaluation (Addendum)
Anesthesia Evaluation  Patient identified by MRN, date of birth, ID band Patient awake    Reviewed: Allergy & Precautions, NPO status , Patient's Chart, lab work & pertinent test results  History of Anesthesia Complications Negative for: history of anesthetic complications  Airway Mallampati: I  TM Distance: >3 FB Neck ROM: Full    Dental no notable dental hx. (+) Teeth Intact, Dental Advisory Given, Caps   Pulmonary neg pulmonary ROS   Pulmonary exam normal        Cardiovascular hypertension, Pt. on medications Normal cardiovascular exam     Neuro/Psych  Neuromuscular disease    GI/Hepatic Neg liver ROS, hiatal hernia,GERD  Medicated,,  Endo/Other  negative endocrine ROS Hyperthyroidism   Renal/GU negative Renal ROS  negative genitourinary   Musculoskeletal  (+) Arthritis ,  LEFT CARPAL TUNNEL SYNDROME, LEFT LONG TRIGGER DIGIT, LEFT LONG FINGER ANNULAR LIGAMENT CYST   Abdominal   Peds  Hematology negative hematology ROS (+)   Anesthesia Other Findings Day of surgery medications reviewed with patient.  Reproductive/Obstetrics                             Anesthesia Physical Anesthesia Plan  ASA: 3  Anesthesia Plan: General   Post-op Pain Management: Tylenol PO (pre-op)*, Celebrex PO (pre-op)* and Minimal or no pain anticipated   Induction: Intravenous  PONV Risk Score and Plan: 2 and Treatment may vary due to age or medical condition, Ondansetron, TIVA and Dexamethasone  Airway Management Planned: LMA  Additional Equipment: None  Intra-op Plan:   Post-operative Plan: Extubation in OR  Informed Consent: I have reviewed the patients History and Physical, chart, labs and discussed the procedure including the risks, benefits and alternatives for the proposed anesthesia with the patient or authorized representative who has indicated his/her understanding and acceptance.      Dental advisory given  Plan Discussed with: CRNA and Anesthesiologist  Anesthesia Plan Comments: ( )       Anesthesia Quick Evaluation

## 2023-03-28 ENCOUNTER — Encounter (HOSPITAL_BASED_OUTPATIENT_CLINIC_OR_DEPARTMENT_OTHER): Payer: Self-pay | Admitting: Orthopedic Surgery

## 2023-03-28 ENCOUNTER — Encounter: Payer: Medicare PPO | Admitting: Gastroenterology

## 2023-04-07 ENCOUNTER — Other Ambulatory Visit: Payer: Self-pay | Admitting: Medical

## 2023-04-16 DIAGNOSIS — M9901 Segmental and somatic dysfunction of cervical region: Secondary | ICD-10-CM | POA: Diagnosis not present

## 2023-04-16 DIAGNOSIS — M5031 Other cervical disc degeneration,  high cervical region: Secondary | ICD-10-CM | POA: Diagnosis not present

## 2023-04-23 DIAGNOSIS — M9901 Segmental and somatic dysfunction of cervical region: Secondary | ICD-10-CM | POA: Diagnosis not present

## 2023-04-23 DIAGNOSIS — M5031 Other cervical disc degeneration,  high cervical region: Secondary | ICD-10-CM | POA: Diagnosis not present

## 2023-04-30 DIAGNOSIS — M5031 Other cervical disc degeneration,  high cervical region: Secondary | ICD-10-CM | POA: Diagnosis not present

## 2023-04-30 DIAGNOSIS — M9901 Segmental and somatic dysfunction of cervical region: Secondary | ICD-10-CM | POA: Diagnosis not present

## 2023-05-08 DIAGNOSIS — H5213 Myopia, bilateral: Secondary | ICD-10-CM | POA: Diagnosis not present

## 2023-05-09 DIAGNOSIS — M5031 Other cervical disc degeneration,  high cervical region: Secondary | ICD-10-CM | POA: Diagnosis not present

## 2023-05-09 DIAGNOSIS — M9901 Segmental and somatic dysfunction of cervical region: Secondary | ICD-10-CM | POA: Diagnosis not present

## 2023-05-28 ENCOUNTER — Other Ambulatory Visit: Payer: Self-pay | Admitting: Medical

## 2023-05-30 ENCOUNTER — Encounter: Payer: Self-pay | Admitting: Gastroenterology

## 2023-07-07 ENCOUNTER — Other Ambulatory Visit: Payer: Self-pay | Admitting: Medical

## 2023-07-09 DIAGNOSIS — S83242D Other tear of medial meniscus, current injury, left knee, subsequent encounter: Secondary | ICD-10-CM | POA: Diagnosis not present

## 2023-09-21 ENCOUNTER — Ambulatory Visit (AMBULATORY_SURGERY_CENTER)

## 2023-09-21 VITALS — Ht 61.0 in | Wt 153.0 lb

## 2023-09-21 DIAGNOSIS — Z800 Family history of malignant neoplasm of digestive organs: Secondary | ICD-10-CM

## 2023-09-21 DIAGNOSIS — Z860100 Personal history of colon polyps, unspecified: Secondary | ICD-10-CM

## 2023-09-21 NOTE — Progress Notes (Signed)
 No egg or soy allergy known to patient  No issues known to pt with past sedation with any surgeries or procedures Patient denies ever being told they had issues or difficulty with intubation  No FH of Malignant Hyperthermia Pt is not on diet pills Pt is not on  home 02  Pt is not on blood thinners  Pt has intermittent issues with constipation  No A fib or A flutter Have any cardiac testing pending--No Pt can ambulate  Pt denies use of chewing tobacco Discussed diabetic I weight loss medication holds Discussed NSAID holds Checked BMI Pt instructed to use Single[employer] or GoodRx for a price reduction on prep  Pre visit completed

## 2023-09-26 DIAGNOSIS — S83242D Other tear of medial meniscus, current injury, left knee, subsequent encounter: Secondary | ICD-10-CM | POA: Diagnosis not present

## 2023-10-01 DIAGNOSIS — M25562 Pain in left knee: Secondary | ICD-10-CM | POA: Diagnosis not present

## 2023-10-06 ENCOUNTER — Other Ambulatory Visit: Payer: Self-pay | Admitting: Medical

## 2023-10-08 ENCOUNTER — Other Ambulatory Visit: Payer: Self-pay | Admitting: Medical

## 2023-10-08 NOTE — Telephone Encounter (Signed)
 Patient is due mid April for CPE. Please schedule.

## 2023-10-08 NOTE — Telephone Encounter (Signed)
 Copied from CRM [id number]. Topic: Clinical - Medication Refill >> Oct 08, 2023 11:32 AM Vista Lawman wrote: Most Recent Primary Care Visit:  Provider: Barb Merino  Department: [facility]  Visit Type: MEDICARE AWV, SEQUENTIAL  Date: 02/06/2023  Medication: DULoxetine (CYMBALTA) 20 MG capsule  Has the patient contacted their pharmacy? Yes (Agent: If no, request that the patient contact the pharmacy for the refill. If patient does not wish to contact the pharmacy document the reason why and proceed with request.) (Agent: If yes, when and what did the pharmacy advise?)  Is this the correct pharmacy for this prescription? Yes If no, delete pharmacy and type the correct one.  This is the patient's preferred pharmacy:  [id number] [place], [place] - [address] [place] [address] Phone: [phone] Fax: [phone]   Has the prescription been filled recently? No  Is the patient out of the medication? No  Has the patient been seen for an appointment in the last year OR does the patient have an upcoming appointment? Yes  Can we respond through MyChart? No  Agent: Please be advised that Rx refills may take up to 3 business days. We ask that you follow-up with your pharmacy.

## 2023-10-18 ENCOUNTER — Encounter: Payer: Self-pay | Admitting: Gastroenterology

## 2023-10-23 ENCOUNTER — Encounter: Payer: Self-pay | Admitting: Gastroenterology

## 2023-10-23 ENCOUNTER — Ambulatory Visit (AMBULATORY_SURGERY_CENTER): Admitting: Gastroenterology

## 2023-10-23 VITALS — BP 124/79 | HR 60 | Temp 97.3°F | Resp 11 | Ht 61.0 in | Wt 153.0 lb

## 2023-10-23 DIAGNOSIS — K6389 Other specified diseases of intestine: Secondary | ICD-10-CM

## 2023-10-23 DIAGNOSIS — Z860100 Personal history of colon polyps, unspecified: Secondary | ICD-10-CM

## 2023-10-23 DIAGNOSIS — K5730 Diverticulosis of large intestine without perforation or abscess without bleeding: Secondary | ICD-10-CM | POA: Diagnosis not present

## 2023-10-23 DIAGNOSIS — K648 Other hemorrhoids: Secondary | ICD-10-CM | POA: Diagnosis not present

## 2023-10-23 DIAGNOSIS — D122 Benign neoplasm of ascending colon: Secondary | ICD-10-CM | POA: Diagnosis not present

## 2023-10-23 DIAGNOSIS — Z1211 Encounter for screening for malignant neoplasm of colon: Secondary | ICD-10-CM

## 2023-10-23 DIAGNOSIS — D123 Benign neoplasm of transverse colon: Secondary | ICD-10-CM

## 2023-10-23 DIAGNOSIS — I10 Essential (primary) hypertension: Secondary | ICD-10-CM | POA: Diagnosis not present

## 2023-10-23 DIAGNOSIS — Z800 Family history of malignant neoplasm of digestive organs: Secondary | ICD-10-CM | POA: Diagnosis not present

## 2023-10-23 DIAGNOSIS — Z860101 Personal history of adenomatous and serrated colon polyps: Secondary | ICD-10-CM | POA: Diagnosis not present

## 2023-10-23 MED ORDER — SODIUM CHLORIDE 0.9 % IV SOLN: 500.0000 mL | Freq: Once | INTRAVENOUS | Status: DC

## 2023-10-23 NOTE — Progress Notes (Signed)
 Pt's states no medical or surgical changes since previsit or office visit.

## 2023-10-23 NOTE — Progress Notes (Signed)
 Vss nad trans to pacu

## 2023-10-23 NOTE — Progress Notes (Signed)
 History and Physical:  This patient presents for endoscopic testing for: Encounter Diagnosis  Name Primary?   Hx of colonic polyps Yes    4mm TA  Dec 2019 - family Hx CRC surveillance exam today Patient denies chronic abdominal pain, rectal bleeding, constipation or diarrhea.   Patient is otherwise without complaints or active issues today.   Past Medical History: Past Medical History:  Diagnosis Date   Allergic rhinitis    Allergy    Arthritis    Cataract    bil   Colon polyps    Esophagitis    GERD (gastroesophageal reflux disease)    HH (hiatus hernia)    HTN (hypertension)    Hyperlipidemia    Increased pressure in the eye    OA (osteoarthritis)    hands   Ocular tension increased    at risk for glaucoma   Osteoporosis    Rectal pain      Past Surgical History: Past Surgical History:  Procedure Laterality Date   ABDOMINAL HYSTERECTOMY     still has ovaries   APPENDECTOMY     BREAST CYST ASPIRATION  07/2001   CARPAL TUNNEL RELEASE Right 05/29/2022   Procedure: RIGHT CARPAL TUNNEL RELEASE;  Surgeon: Betha Loa, MD;  Location: [facility];  Service: Orthopedics;  Laterality: Right;  Bier block   CARPAL TUNNEL RELEASE Left 09/28/2022   Procedure: LEFT CARPAL TUNNEL RELEASE;  Surgeon: Betha Loa, MD;  Location: [facility];  Service: Orthopedics;  Laterality: Left;  45 MIN   CATARACT EXTRACTION  03/2006   CYST EXCISION Left 09/28/2022   Procedure: LEFT LONG FINGER EXCISION ANNULAR LIGAMENT CYST;  Surgeon: Betha Loa, MD;  Location: [facility];  Service: Orthopedics;  Laterality: Left;   ESOPHAGOGASTRODUODENOSCOPY  05/2007   HEMORRHOID SURGERY     KNEE ARTHROSCOPY WITH MEDIAL MENISECTOMY Left 03/27/2023   Procedure: KNEE ARTHROSCOPY WITH MEDIAL MENISECTOMY;  Surgeon: Teryl Lucy, MD;  Location: [facility];  Service: Orthopedics;  Laterality: Left;   KNEE ARTHROSCOPY WITH MENISCAL REPAIR  2021    rectal sphincterotomy     Retinal laser surgery     TRIGGER FINGER RELEASE Left 09/28/2022   Procedure: RELEASE TRIGGER FINGER/A-1 PULLEY LEFT LONG FINGER;  Surgeon: Betha Loa, MD;  Location: [facility];  Service: Orthopedics;  Laterality: Left;   UPPER GASTROINTESTINAL ENDOSCOPY      Allergies: Allergies  Allergen Reactions   Sulfa Antibiotics Other (See Comments)    Lip swelling   Amlodipine Besylate Rash   Atorvastatin Other (See Comments)    Memory loss   Lotrel [Amlodipine Besy-Benazepril Hcl] Hives    Most likely from the benazepril    Molnupiravir Other (See Comments)    Sick feeling    Outpatient Meds: Current Outpatient Medications  Medication Sig Dispense Refill   DULoxetine (CYMBALTA) 20 MG capsule TAKE 1 CAPSULE BY MOUTH EVERY DAY 30 capsule 0   fluticasone (FLONASE) 50 MCG/ACT nasal spray Place 2 sprays into both nostrils daily as needed for allergies or rhinitis.     hydrochlorothiazide (HYDRODIURIL) 25 MG tablet Take 1 tablet (25 mg total) by mouth daily. 90 tablet 3   loratadine (CLARITIN) 10 MG tablet TAKE 1 TABLET BY MOUTH EVERY DAY 90 tablet 0   omeprazole (PRILOSEC) 20 MG capsule TAKE 1 CAPSULE BY MOUTH DAILY BEFORE A MEAL 90 capsule 1   rosuvastatin (CRESTOR) 10 MG tablet Take 1 tablet (10 mg total) by mouth daily. 90 tablet  3   timolol (TIMOPTIC) 0.5 % ophthalmic solution Place 1 drop into both eyes 2 (two) times daily.     acetaminophen (TYLENOL) 325 MG tablet Take 650 mg by mouth every 6 (six) hours as needed.     aspirin EC 81 MG tablet Take 81 mg by mouth daily. Swallow whole. (Patient not taking: Reported on 09/21/2023)     Calcium Carb-Cholecalciferol (CALCIUM + VITAMIN D3 PO) Take by mouth.     ibuprofen (ADVIL) 200 MG tablet Take 200 mg by mouth every 6 (six) hours as needed. Takes 2 once or twice daily     traMADol (ULTRAM) 50 MG tablet Take 1 tablet (50 mg total) by mouth every 8 (eight) hours as needed for severe pain (not controlled  by tylenol). (Patient not taking: Reported on 09/21/2023) 10 tablet 0   Current Facility-Administered Medications  Medication Dose Route Frequency Provider Last Rate Last Admin   0.9 %  sodium chloride infusion  500 mL Intravenous Once Danis, Mayuri Staples L III, MD          ___________________________________________________________________ Objective   Exam:  BP 137/82   Pulse 62   Temp (!) 97.3 F (36.3 C)   Resp 10   Ht 5\' 1"  (1.549 m)   Wt 153 lb (69.4 kg)   SpO2 99%   BMI 28.91 kg/m   CV: regular , S1/S2 Resp: clear to auscultation bilaterally, normal RR and effort noted GI: soft, no tenderness, with active bowel sounds.   Assessment: Encounter Diagnosis  Name Primary?   Hx of colonic polyps Yes     Plan: Colonoscopy   The benefits and risks of the planned procedure(s) were described in detail with the patient or (when appropriate) their health care proxy.  Risks were outlined as including, but not limited to, bleeding, infection, perforation, adverse medication reaction leading to cardiac or pulmonary decompensation, pancreatitis (if ERCP).  The limitation of incomplete mucosal visualization was also discussed.  No guarantees or warranties were given.  The patient is appropriate for an endoscopic procedure in the ambulatory setting.   - Lorella Roles, MD

## 2023-10-23 NOTE — Patient Instructions (Signed)

## 2023-10-23 NOTE — Op Note (Signed)
 [facility] Patient Name: Deborah Gardner Procedure Date: 10/23/2023 2:22 PM MRN: [id number] Endoscopist: Sherilyn Cooter L. Myrtie Neither , MD, [id number] Age: 78 Referring MD:  Date of Birth: 1946-04-17 Gender: Female Account #: [id number] Procedure:                Colonoscopy Indications:              Screening in patient at increased risk: Colorectal                            cancer in father 31 or older, Surveillance:                            Personal history of adenomatous polyps on last                            colonoscopy 5 years ago (diminutive TA in 2019) Medicines:                Monitored Anesthesia Care Procedure:                Pre-Anesthesia Assessment:                           - Prior to the procedure, a History and Physical                            was performed, and patient medications and                            allergies were reviewed. The patient's tolerance of                            previous anesthesia was also reviewed. The risks                            and benefits of the procedure and the sedation                            options and risks were discussed with the patient.                            All questions were answered, and informed consent                            was obtained. Prior Anticoagulants: The patient has                            taken no anticoagulant or antiplatelet agents. ASA                            Grade Assessment: II - A patient with mild systemic                            disease. After reviewing the risks and benefits,  the patient was deemed in satisfactory condition to                            undergo the procedure.                           After obtaining informed consent, the colonoscope                            was passed under direct vision. Throughout the                            procedure, the patient's blood pressure, pulse, and                            oxygen saturations  were monitored continuously. The                            [id number]-HQ190L Colonoscope [id number] was introduced                            through the anus and advanced to the the cecum,                            identified by appendiceal orifice and ileocecal                            valve. The colonoscopy was somewhat difficult due                            to multiple diverticula in the colon. Successful                            completion of the procedure was aided by using                            manual pressure and straightening and shortening                            the scope to obtain bowel loop reduction. The                            patient tolerated the procedure well. The quality                            of the bowel preparation was generally good with                            scattered areas fair. The ileocecal valve,                            appendiceal orifice, and rectum were photographed. Scope In: 2:51:08 PM Scope Out: 3:11:42 PM Scope Withdrawal Time: 0 hours 16 minutes 32 seconds  Total Procedure Duration:  0 hours 20 minutes 34 seconds  Findings:                 The perianal and digital rectal examinations were                            normal except for a small anterior skin tag.                           Multiple diverticula were found in the left colon.                           Repeat examination of right colon under NBI                            performed.                           Five sessile polyps were found in the distal                            transverse colon(1) and ascending colon (4). The                            polyps were diminutive in size. These polyps were                            removed with a cold snare. Resection and retrieval                            were complete.                           Internal hemorrhoids were found. The hemorrhoids                            were small.                           The exam was  otherwise without abnormality. Complications:            No immediate complications. Estimated Blood Loss:     Estimated blood loss: none. Estimated blood loss                            was minimal. Impression:               - Diverticulosis in the left colon.                           - Five diminutive polyps in the transverse colon                            and in the ascending colon, removed with a cold                            snare. Resected  and retrieved.                           - Internal hemorrhoids.                           - The examination was otherwise normal. Recommendation:           - Patient has a contact number available for                            emergencies. The signs and symptoms of potential                            delayed complications were discussed with the                            patient. Return to normal activities tomorrow.                            Written discharge instructions were provided to the                            patient.                           - Resume previous diet.                           - Continue present medications.                           - Await pathology results.                           - No repeat surveillance colonoscopy recommended                            due to age, current guidelines and low risk                            findings today. Manahil Vanzile L. Dominic Friendly, MD 10/23/2023 3:17:42 PM This report has been signed electronically.

## 2023-10-24 ENCOUNTER — Telehealth: Payer: Self-pay | Admitting: *Deleted

## 2023-10-24 NOTE — Telephone Encounter (Signed)
  Follow up Call-     10/23/2023    2:23 PM  Call back number  Post procedure Call Back phone  # [phone]  Permission to leave phone message Yes     Patient questions:  Do you have a fever, pain , or abdominal swelling? No. Pain Score  0 *  Have you tolerated food without any problems? Yes.    Have you been able to return to your normal activities? Yes.    Do you have any questions about your discharge instructions: Diet   No. Medications  No. Follow up visit  No.  Do you have questions or concerns about your Care? No.  Actions: * If pain score is 4 or above: No action needed, pain <4.

## 2023-10-26 LAB — SURGICAL PATHOLOGY

## 2023-10-29 DIAGNOSIS — M25562 Pain in left knee: Secondary | ICD-10-CM | POA: Diagnosis not present

## 2023-10-31 ENCOUNTER — Encounter: Payer: Self-pay | Admitting: Gastroenterology

## 2023-11-04 ENCOUNTER — Other Ambulatory Visit: Payer: Self-pay | Admitting: Medical

## 2023-11-05 NOTE — Telephone Encounter (Signed)
 Pt already has CPE scheduled 02/07/24

## 2023-11-06 DIAGNOSIS — H40053 Ocular hypertension, bilateral: Secondary | ICD-10-CM | POA: Diagnosis not present

## 2023-11-20 ENCOUNTER — Other Ambulatory Visit: Payer: Self-pay | Admitting: Medical

## 2023-11-20 DIAGNOSIS — I10 Essential (primary) hypertension: Secondary | ICD-10-CM

## 2023-11-20 NOTE — Telephone Encounter (Signed)
 Pt has an appointment coming up in July

## 2023-11-21 ENCOUNTER — Other Ambulatory Visit: Payer: Self-pay | Admitting: Medical

## 2023-11-21 NOTE — Telephone Encounter (Signed)
Has an appt in July  

## 2023-11-23 ENCOUNTER — Other Ambulatory Visit: Payer: Self-pay | Admitting: Medical

## 2023-11-25 ENCOUNTER — Other Ambulatory Visit: Payer: Self-pay | Admitting: Medical

## 2024-02-07 ENCOUNTER — Encounter: Payer: Self-pay | Admitting: Medical

## 2024-02-07 ENCOUNTER — Ambulatory Visit: Admitting: Medical

## 2024-02-07 VITALS — BP 130/74 | HR 60 | Ht 59.5 in | Wt 149.4 lb

## 2024-02-07 DIAGNOSIS — M40209 Unspecified kyphosis, site unspecified: Secondary | ICD-10-CM

## 2024-02-07 DIAGNOSIS — Y93F9 Activity, other caregiving: Secondary | ICD-10-CM

## 2024-02-07 DIAGNOSIS — M8580 Other specified disorders of bone density and structure, unspecified site: Secondary | ICD-10-CM | POA: Insufficient documentation

## 2024-02-07 DIAGNOSIS — I10 Essential (primary) hypertension: Secondary | ICD-10-CM

## 2024-02-07 DIAGNOSIS — Z7185 Encounter for immunization safety counseling: Secondary | ICD-10-CM

## 2024-02-07 DIAGNOSIS — F341 Dysthymic disorder: Secondary | ICD-10-CM | POA: Diagnosis not present

## 2024-02-07 DIAGNOSIS — M1990 Unspecified osteoarthritis, unspecified site: Secondary | ICD-10-CM | POA: Diagnosis not present

## 2024-02-07 DIAGNOSIS — Z0000 Encounter for general adult medical examination without abnormal findings: Secondary | ICD-10-CM

## 2024-02-07 DIAGNOSIS — E7800 Pure hypercholesterolemia, unspecified: Secondary | ICD-10-CM

## 2024-02-07 DIAGNOSIS — E0590 Thyrotoxicosis, unspecified without thyrotoxic crisis or storm: Secondary | ICD-10-CM | POA: Diagnosis not present

## 2024-02-07 LAB — LIPID PANEL

## 2024-02-07 MED ORDER — TRIAMCINOLONE ACETONIDE 0.1 % EX CREA: 1.0000 | TOPICAL_CREAM | Freq: Two times a day (BID) | CUTANEOUS | 0 refills | Status: DC

## 2024-02-07 NOTE — Progress Notes (Signed)
 Subjective:   HPI  Deborah Gardner is a 78 y.o. female who presents for Chief Complaint  Patient presents with   Annual Exam    Fasting cpe, spot on her collarbone that is irritated. No other concerns    Patient Care Team: Nereyda Bowler, Alm GORMAN RIGGERS as PCP - General (Family Medicine) Dr. Victory Brand, GI Sees dentist and eye doctor Dr. Franky Curia, orthopedics   Concerns: Here for well visit.  She notes that her curvature in her upper back may have gotten a little worse.  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  She has been helping out with her son-in-law and his family as he had an amputation and he has a special needs son.  She has been using turmeric lately to help with some of her chronic pain and arthritis.  She continues on Cymbalta   Hypertension - compliant with blood pressure medication.   No BP concerns, no chest pain, no swelling in legs  Hyperlipidemia - compliant with medication daily without c/co.   Reviewed their medical, surgical, family, social, medication, and allergy history and updated chart as appropriate.  Allergies  Allergen Reactions   Sulfa  Antibiotics Other (See Comments)    Lip swelling   Amlodipine  Besylate Rash   Atorvastatin  Other (See Comments)    Memory loss   Lotrel [Amlodipine  Besy-Benazepril  Hcl] Hives    Most likely from the benazepril     Molnupiravir  Other (See Comments)    Sick feeling    Past Medical History:  Diagnosis Date   Allergic rhinitis    Allergy    Arthritis    Cataract    bil   Colon polyps    Esophagitis    GERD (gastroesophageal reflux disease)    HH (hiatus hernia)    HTN (hypertension)    Hyperlipidemia    Increased pressure in the eye    OA (osteoarthritis)    hands   Ocular tension increased    at risk for glaucoma   Osteoporosis    Rectal pain       Current Outpatient Medications:    acetaminophen  (TYLENOL ) 325 MG tablet, Take 650 mg by mouth every 6 (six)  hours as needed., Disp: , Rfl:    Calcium  Carb-Cholecalciferol (CALCIUM  + VITAMIN D3 PO), Take by mouth., Disp: , Rfl:    DULoxetine  (CYMBALTA ) 20 MG capsule, TAKE 1 CAPSULE BY MOUTH EVERY DAY, Disp: 90 capsule, Rfl: 1   fluticasone  (FLONASE ) 50 MCG/ACT nasal spray, SPRAY 1 SPRAY INTO BOTH NOSTRILS DAILY., Disp: 48 mL, Rfl: 2   hydrochlorothiazide  (HYDRODIURIL ) 25 MG tablet, TAKE 1 TABLET (25 MG TOTAL) BY MOUTH DAILY., Disp: 90 tablet, Rfl: 0   ibuprofen (ADVIL) 200 MG tablet, Take 200 mg by mouth every 6 (six) hours as needed. Takes 2 once or twice daily, Disp: , Rfl:    loratadine  (CLARITIN ) 10 MG tablet, TAKE 1 TABLET BY MOUTH EVERY DAY, Disp: 90 tablet, Rfl: 0   omeprazole  (PRILOSEC) 20 MG capsule, TAKE 1 CAPSULE BY MOUTH DAILY BEFORE A MEAL, Disp: 90 capsule, Rfl: 0   rosuvastatin  (CRESTOR ) 10 MG tablet, TAKE 1 TABLET BY MOUTH EVERY DAY, Disp: 90 tablet, Rfl: 0   timolol (TIMOPTIC) 0.5 % ophthalmic solution, Place 1 drop into both eyes 2 (two) times daily., Disp: , Rfl:    triamcinolone  cream (KENALOG ) 0.1 %, Apply 1 Application topically 2 (two) times daily., Disp: 30 g, Rfl: 0   TURMERIC PO, Take by  mouth., Disp: , Rfl:   Family History  Problem Relation Age of Onset   Thyroid  disease Mother    Depression Mother    Hypertension Mother    Osteoporosis Mother    Colon cancer Father    Pneumonia Father    Liver disease Brother        from alcohol   Alcohol abuse Brother        terminal    Alcohol abuse Brother    Breast cancer Maternal Aunt    Breast cancer Paternal Aunt    Breast cancer Paternal Aunt    Thyroid  disease Other        half sister   Colon cancer Other        Aunt   Esophageal cancer Neg Hx    Stomach cancer Neg Hx    Rectal cancer Neg Hx     Past Surgical History:  Procedure Laterality Date   ABDOMINAL HYSTERECTOMY     still has ovaries   APPENDECTOMY     BREAST CYST ASPIRATION  07/2001   CARPAL TUNNEL RELEASE Right 05/29/2022   Procedure: RIGHT CARPAL  TUNNEL RELEASE;  Surgeon: Murrell Drivers, MD;  Location: [facility];  Service: Orthopedics;  Laterality: Right;  Bier block   CARPAL TUNNEL RELEASE Left 09/28/2022   Procedure: LEFT CARPAL TUNNEL RELEASE;  Surgeon: Murrell Drivers, MD;  Location: [facility];  Service: Orthopedics;  Laterality: Left;  45 MIN   CATARACT EXTRACTION  03/2006   CYST EXCISION Left 09/28/2022   Procedure: LEFT LONG FINGER EXCISION ANNULAR LIGAMENT CYST;  Surgeon: Murrell Drivers, MD;  Location: [facility];  Service: Orthopedics;  Laterality: Left;   ESOPHAGOGASTRODUODENOSCOPY  05/2007   HEMORRHOID SURGERY     KNEE ARTHROSCOPY WITH MEDIAL MENISECTOMY Left 03/27/2023   Procedure: KNEE ARTHROSCOPY WITH MEDIAL MENISECTOMY;  Surgeon: Josefina Chew, MD;  Location: [facility];  Service: Orthopedics;  Laterality: Left;   KNEE ARTHROSCOPY WITH MENISCAL REPAIR  2021   rectal sphincterotomy     Retinal laser surgery     TRIGGER FINGER RELEASE Left 09/28/2022   Procedure: RELEASE TRIGGER FINGER/A-1 PULLEY LEFT LONG FINGER;  Surgeon: Murrell Drivers, MD;  Location: [facility];  Service: Orthopedics;  Laterality: Left;   UPPER GASTROINTESTINAL ENDOSCOPY     Review of Systems  Constitutional:  Negative for chills, fever, malaise/fatigue and weight loss.  HENT:  Negative for congestion, ear pain, hearing loss, sore throat and tinnitus.   Eyes:  Negative for blurred vision, pain and redness.  Respiratory:  Negative for cough, hemoptysis and shortness of breath.   Cardiovascular:  Negative for chest pain, palpitations, orthopnea, claudication and leg swelling.  Gastrointestinal:  Negative for abdominal pain, blood in stool, constipation, diarrhea, nausea and vomiting.  Genitourinary:  Negative for dysuria, flank pain, frequency, hematuria and urgency.  Musculoskeletal:  Positive for back pain and joint pain. Negative for falls and myalgias.  Skin:  Negative for itching  and rash.  Neurological:  Negative for dizziness, tingling, speech change, weakness and headaches.  Endo/Heme/Allergies:  Negative for polydipsia. Does not bruise/bleed easily.  Psychiatric/Behavioral:  Negative for depression and memory loss. The patient is not nervous/anxious and does not have insomnia.        Objective:  BP 130/74   Pulse 60   Ht 4' 11.5 (1.511 m)   Wt 149 lb 6.4 oz (67.8 kg)   BMI 29.67 kg/m   BP Readings from Last 3 Encounters:  02/07/24 130/74  10/23/23 124/79  03/27/23 (!) 161/80   Wt Readings from Last 3 Encounters:  02/07/24 149 lb 6.4 oz (67.8 kg)  10/23/23 153 lb (69.4 kg)  09/21/23 153 lb (69.4 kg)    General appearance: alert, no distress, WD/WN, Caucasian female Skin: Left upper back supraspinatus region with 3 mm raised somewhat papular erythematous lesion without fluctuance or warmth, nonspecific, no other  worrisome findings HEENT: normocephalic, conjunctiva/corneas normal, sclerae anicteric, PERRLA, EOMi, nares patent, no discharge or erythema, pharynx normal Oral cavity: MMM, tongue normal, teeth in good repair Neck: supple, no lymphadenopathy, no thyromegaly, no masses, normal ROM, no bruits Chest: non tender, normal shape and expansion Heart: RRR, normal S1, S2, no murmurs Lungs: CTA bilaterally, no wheezes, rhonchi, or rales Abdomen: +bs, soft, non tender, non distended, no masses, no hepatomegaly, no splenomegaly, no bruits Back: Range of motion to about 70 percent of normal, kyphosis in the upper thoracic spine, mild pain with range of motion in general otherwise non tender Musculoskeletal: upper extremities non tender, no obvious deformity, normal ROM throughout, lower extremities non tender, no obvious deformity, normal ROM throughout Extremities: no edema, no cyanosis, no clubbing Pulses: 2+ symmetric, upper and lower extremities, normal cap refill Neurological: alert, oriented x 3, CN2-12 intact, strength normal upper extremities  and lower extremities, sensation normal throughout, DTRs 2+ throughout, no cerebellar signs, gait normal Psychiatric: normal affect, behavior normal, pleasant  Breast/gyn/rectal - deferred to gynecology     Assessment and Plan :   Encounter Diagnoses  Name Primary?   Routine general medical examination at a health care facility Yes   Subclinical hyperthyroidism    Vaccine counseling    Essential hypertension    Pure hypercholesterolemia    Osteopenia, unspecified location    Dysthymia    Osteoarthritis, unspecified osteoarthritis type, unspecified site    Kyphosis, unspecified kyphosis type, unspecified spinal region    Activity, other caregiving       This visit was a preventative care visit, also known as wellness visit or routine physical.   Topics typically include healthy lifestyle, diet, exercise, preventative care, vaccinations, sick and well care, proper use of emergency dept and after hours care, as well as other concerns.    Separate significant issues discussed:  Hyperlipidemia-updated labs today, continue rosuvastatin  10 mg daily  Hypertension-continue current medication hydrochlorothiazide  25 mg daily  She continues on the Cymbalta  for pain and dysthymia, does fine on this  Kyphosis-we discussed using a supportive brace and offered referral to physical therapy.  She is helping as a caregiver currently so does not want to pursue physical therapy at this time  Rash of left upper back/neck area-advised over-the-counter triple antibiotic ointment at nighttime, triamcinolone  in the morning and warm compresses.  If not resolved within 10 days then let me know.    General Recommendations: Continue to return yearly for your annual wellness and preventative care visits.  This gives us  a chance to discuss healthy lifestyle, exercise, vaccinations, review your chart record, and perform screenings where appropriate.  I recommend you see your eye doctor yearly for routine  vision care.  I recommend you see your dentist yearly for routine dental care including hygiene visits twice yearly.   Vaccination recommendations were reviewed Immunization History  Administered Date(s) Administered   Fluad Quad(high Dose 65+) 03/18/2020, 05/11/2022   Influenza Split 03/11/2011, 03/28/2012, 04/10/2016   Influenza Whole 05/13/2010   Influenza, High Dose Seasonal PF 05/01/2013, 05/18/2014, 09/10/2015, 04/10/2016, 04/12/2017, 05/07/2018, 04/02/2019, 04/20/2021, 06/15/2023  PFIZER(Purple Top)SARS-COV-2 Vaccination 09/22/2019, 10/13/2019   Pneumococcal Conjugate-13 12/02/2014   Pneumococcal Polysaccharide-23 03/14/2011   Td 08/30/2004   Zoster, Live 11/24/2009    Vaccine recommendations: Shingles Tetanus Prevnar 20 pneumococcal  Vaccines administered today: None, declines   Screening for cancer: Colon cancer screening: I reviewed 10/2023 colonoscopy from Dr. Legrand, no future colonoscpy planned.  Breast cancer screening: You should perform a self breast exam monthly.   We reviewed recommendations for regular mammograms and breast cancer screening.   Skin cancer screening: Check your skin regularly for new changes, growing lesions, or other lesions of concern Come in for evaluation if you have skin lesions of concern.  Lung cancer screening: If you have a greater than 20 pack year history of tobacco use, then you may qualify for lung cancer screening with a chest CT scan.   Please call your insurance company to inquire about coverage for this test.  Pancreatic cancer: no current screening test is available routinely recommended.  (Risk factors: Smoking, overweight or obese, diabetes, chronic pancreatitis, work Nurse, mental health, Solicitor, 42 year old or greater, female greater than female, African-American, family history of pancreatic cancer, hereditary breast, ovarian, melanoma, Lynch, Peutz-jeghers).  We currently don't have screenings for other  cancers besides breast, cervical, colon, and lung cancers.  If you have a strong family history of cancer or have other cancer screening concerns, please let me know.    Bone health: Get at least 150 minutes of aerobic exercise weekly Get weight bearing exercise at least once weekly Bone density test:  A bone density test is an imaging test that uses a type of X-ray to measure the amount of calcium  and other minerals in your bones. The test may be used to diagnose or screen you for a condition that causes weak or thin bones (osteoporosis), predict your risk for a broken bone (fracture), or determine how well your osteoporosis treatment is working. The bone density test is recommended for females 65 and older, or females or males <65 if certain risk factors such as thyroid  disease, long term use of steroids such as for asthma or rheumatological issues, vitamin D  deficiency, estrogen deficiency, family history of osteoporosis, self or family history of fragility fracture in first degree relative.  03/2022 osteopenia on bone density test Consider repeat bone density test this year   Heart health: Get at least 150 minutes of aerobic exercise weekly Limit alcohol It is important to maintain a healthy blood pressure and healthy cholesterol numbers  Heart disease screening: Screening for heart disease includes screening for blood pressure, fasting lipids, glucose/diabetes screening, BMI height to weight ratio, reviewed of smoking status, physical activity, and diet.    Goals include blood pressure 120/80 or less, maintaining a healthy lipid/cholesterol profile, preventing diabetes or keeping diabetes numbers under good control, not smoking or using tobacco products, exercising most days per week or at least 150 minutes per week of exercise, and eating healthy variety of fruits and vegetables, healthy oils, and avoiding unhealthy food choices like fried food, fast food, high sugar and high cholesterol  foods.    Other tests may possibly include EKG test, CT coronary calcium  score, echocardiogram, exercise treadmill stress test.     Vascular disease screening: For high risk individuals including smokers, diabetes, patients with known heart disease or high blood pressure, kidney disease, and others, screening for vascular disease or atherosclerosis of the arteries is available.  Examples may include carotid ultrasound, abdominal aortic ultrasound, ABI blood flow screening in the legs, thoracic aorta  screening.    Medical care options: I recommend you continue to seek care here first for routine care.  We try really hard to have available appointments Monday through Friday daytime hours for sick visits, acute visits, and physicals.  Urgent care should be used for after hours and weekends for significant issues that cannot wait till the next day.  The emergency department should be used for significant potentially life-threatening emergencies.  The emergency department is expensive, can often have long wait times for less significant concerns, so try to utilize primary care, urgent care, or telemedicine when possible to avoid unnecessary trips to the emergency department.  Virtual visits and telemedicine have been introduced since the pandemic started in 2020, and can be convenient ways to receive medical care.  We offer virtual appointments as well to assist you in a variety of options to seek medical care.   Legal Take the time to do a Last Will and Testament, advanced directives including Healthcare Power of Attorney and Living Will documents.  Do not leave your family with burdens that can be handled ahead of time.   Advanced Directives: I recommend you consider completing a Health Care Power of Attorney and Living Will.   These documents respect your wishes and help alleviate burdens on your loved ones if you were to become terminally ill or be in a position to need those documents enforced.     You can complete Advanced Directives yourself, have them notarized, then have copies made for our office, for you and for anybody you feel should have them in safe keeping.  Or, you can have an attorney prepare these documents.   If you haven't updated your Last Will and Testament in a while, it may be worthwhile having an attorney prepare these documents together and save on some costs.       Spiritual and Emotional Health Keeping a healthy spiritual life can help you better manage your physical health. Your spiritual life can help you to cope with any issues that may arise with your physical health.  Balance can keep us  healthy and help us  to recover.  If you are struggling with your spiritual health there are questions that you may want to ask yourself:  What makes me feel most complete? When do I feel most connected to the rest of the world? Where do I find the most inner strength? What am I doing when I feel whole?  Helpful tips: Being in nature. Some people feel very connected and at peace when they are walking outdoors or are outside. Helping others. Some feel the largest sense of wellbeing when they are of service to others. Being of service can take on many forms. It can be doing volunteer work, being kind to strangers, or offering a hand to a friend in need. Gratitude. Some people find they feel the most connected when they remain grateful. They may make lists of all the things they are grateful for or say a thank you out loud for all they have.    Emotional Health Are you in tune with your emotional health?  Check out this link: http://www.marquez-love.com/   Financial Health Make sure you use a budget for your personal finances Make sure you are insured against risks (health insurance, life insurance, auto insurance, etc) Save more, spend less Set financial goals If you need help in this area, good resources include counseling through [employer] or other  community resources, have a meeting with a certified financial  planner, and a good resource is Deatrice Shoulder podcast    Phillip Sandler was seen today for annual exam.  Diagnoses and all orders for this visit:  Routine general medical examination at a health care facility -     DG Bone Density; Future -     CBC -     Comprehensive metabolic panel with GFR -     Lipid panel -     VITAMIN D  25 Hydroxy (Vit-D Deficiency, Fractures) -     TSH + free T4  Subclinical hyperthyroidism -     TSH + free T4  Vaccine counseling  Essential hypertension  Pure hypercholesterolemia -     Lipid panel  Osteopenia, unspecified location -     VITAMIN D  25 Hydroxy (Vit-D Deficiency, Fractures)  Dysthymia  Osteoarthritis, unspecified osteoarthritis type, unspecified site  Kyphosis, unspecified kyphosis type, unspecified spinal region  Activity, other caregiving  Other orders -     triamcinolone  cream (KENALOG ) 0.1 %; Apply 1 Application topically 2 (two) times daily.     Follow-up pending labs, yearly for physical

## 2024-02-08 ENCOUNTER — Ambulatory Visit: Payer: Self-pay | Admitting: Medical

## 2024-02-08 ENCOUNTER — Other Ambulatory Visit: Payer: Self-pay | Admitting: Medical

## 2024-02-08 DIAGNOSIS — I10 Essential (primary) hypertension: Secondary | ICD-10-CM

## 2024-02-08 LAB — COMPREHENSIVE METABOLIC PANEL WITH GFR
ALT: 9 IU/L (ref 0–32)
AST: 14 IU/L (ref 0–40)
Albumin: 4.4 g/dL (ref 3.8–4.8)
Alkaline Phosphatase: 91 IU/L (ref 44–121)
BUN/Creatinine Ratio: 19 (ref 12–28)
BUN: 16 mg/dL (ref 8–27)
Bilirubin Total: 0.4 mg/dL (ref 0.0–1.2)
CO2: 24 mmol/L (ref 20–29)
Calcium: 9.7 mg/dL (ref 8.7–10.3)
Chloride: 98 mmol/L (ref 96–106)
Creatinine, Ser: 0.86 mg/dL (ref 0.57–1.00)
Globulin, Total: 2.6 g/dL (ref 1.5–4.5)
Glucose: 95 mg/dL (ref 70–99)
Potassium: 3.8 mmol/L (ref 3.5–5.2)
Sodium: 138 mmol/L (ref 134–144)
Total Protein: 7 g/dL (ref 6.0–8.5)
eGFR: 68 mL/min/1.73 (ref >59)

## 2024-02-08 LAB — CBC
Hematocrit: 36.5 % (ref 34.0–46.6)
Hemoglobin: 11.8 g/dL (ref 11.1–15.9)
MCH: 29.4 pg (ref 26.6–33.0)
MCHC: 32.3 g/dL (ref 31.5–35.7)
MCV: 91 fL (ref 79–97)
Platelets: 264 x10E3/uL (ref 150–450)
RBC: 4.01 x10E6/uL (ref 3.77–5.28)
RDW: 12.1 % (ref 11.7–15.4)
WBC: 7.7 x10E3/uL (ref 3.4–10.8)

## 2024-02-08 LAB — TSH+FREE T4
Free T4: 1.15 ng/dL (ref 0.82–1.77)
TSH: 0.627 u[IU]/mL (ref 0.450–4.500)

## 2024-02-08 LAB — VITAMIN D 25 HYDROXY (VIT D DEFICIENCY, FRACTURES): Vit D, 25-Hydroxy: 44.1 ng/mL (ref 30.0–100.0)

## 2024-02-08 LAB — LIPID PANEL
Cholesterol, Total: 148 mg/dL (ref 100–199)
HDL: 61 mg/dL (ref >39)
LDL CALC COMMENT:: 2.4 ratio (ref 0.0–4.4)
LDL Chol Calc (NIH): 64 mg/dL (ref 0–99)
Triglycerides: 132 mg/dL (ref 0–149)
VLDL Cholesterol Cal: 23 mg/dL (ref 5–40)

## 2024-02-08 MED ORDER — OMEPRAZOLE 20 MG PO CPDR: DELAYED_RELEASE_CAPSULE | ORAL | 2 refills | Status: AC

## 2024-02-08 MED ORDER — ROSUVASTATIN CALCIUM 10 MG PO TABS: 10.0000 mg | ORAL_TABLET | Freq: Every day | ORAL | 2 refills | Status: AC

## 2024-02-08 MED ORDER — HYDROCHLOROTHIAZIDE 25 MG PO TABS: 25.0000 mg | ORAL_TABLET | Freq: Every day | ORAL | 2 refills | Status: AC

## 2024-02-08 NOTE — Progress Notes (Signed)
 Your blood counts are normal, liver kidney and electrolytes normal, cholesterol looks good, vitamin D  normal, thyroid  normal.  If you do not hear back about scheduling bone density test within the next week then let me know  Try to get exercise regularly including resistance or weightbearing exercise.  Glad to see your labs are looking good.

## 2024-02-11 ENCOUNTER — Other Ambulatory Visit: Payer: Self-pay | Admitting: Medical

## 2024-02-11 DIAGNOSIS — M9901 Segmental and somatic dysfunction of cervical region: Secondary | ICD-10-CM | POA: Diagnosis not present

## 2024-02-11 DIAGNOSIS — M5031 Other cervical disc degeneration,  high cervical region: Secondary | ICD-10-CM | POA: Diagnosis not present

## 2024-02-11 MED ORDER — DULOXETINE HCL 20 MG PO CPEP: 20.0000 mg | ORAL_CAPSULE | Freq: Every day | ORAL | 1 refills | Status: AC

## 2024-02-11 NOTE — Telephone Encounter (Signed)
 Copied from CRM [id number]. Topic: Clinical - Medication Refill >> Feb 11, 2024  9:52 AM Carlatta H wrote: Medication: DULoxetine  (CYMBALTA ) 20 MG capsule [[id number]]  Has the patient contacted their pharmacy? No (Agent: If no, request that the patient contact the pharmacy for the refill. If patient does not wish to contact the pharmacy document the reason why and proceed with request.) (Agent: If yes, when and what did the pharmacy advise?)  This is the patient's preferred pharmacy:  [id number] [place], [place] - [address] [place] [address] Phone: [phone] Fax: [phone]  Is this the correct pharmacy for this prescription? Yes If no, delete pharmacy and type the correct one.   Has the prescription been filled recently? No  Is the patient out of the medication? Yes  Has the patient been seen for an appointment in the last year OR does the patient have an upcoming appointment? Yes  Can we respond through MyChart? No  Agent: Please be advised that Rx refills may take up to 3 business days. We ask that you follow-up with your pharmacy.

## 2024-02-12 ENCOUNTER — Ambulatory Visit: Payer: Medicare PPO

## 2024-02-12 DIAGNOSIS — Z0000 Encounter for general adult medical examination without abnormal findings: Secondary | ICD-10-CM

## 2024-02-12 NOTE — Progress Notes (Signed)
 Subjective:   Deborah Gardner is a 78 y.o. who presents for a Medicare Wellness preventive visit.  As a reminder, Annual Wellness Visits don't include a physical exam, and some assessments may be limited, especially if this visit is performed virtually. We may recommend an in-person follow-up visit with your provider if needed.  Visit Complete: Virtual I connected with  Deborah Gardner on 02/12/24 by a audio enabled telemedicine application and verified that I am speaking with the correct person using two identifiers.  Patient Location: Home  Provider Location: Home Office  I discussed the limitations of evaluation and management by telemedicine. The patient expressed understanding and agreed to proceed.  Vital Signs: Because this visit was a virtual/telehealth visit, some criteria may be missing or patient reported. Any vitals not documented were not able to be obtained and vitals that have been documented are patient reported.  VideoError- Librarian, academic were attempted between this provider and patient, however failed, due to patient having technical difficulties OR patient did not have access to video capability.  We continued and completed visit with audio only.   Persons Participating in Visit: Patient.  AWV Questionnaire: No: Patient Medicare AWV questionnaire was not completed prior to this visit.  Cardiac Risk Factors include: advanced age (>52men, >71 women);dyslipidemia;hypertension     Objective:    Today's Vitals   There is no height or weight on file to calculate BMI.     02/12/2024   10:53 AM 03/27/2023    9:09 AM 02/06/2023   10:38 AM 09/28/2022    8:05 AM 09/22/2022    2:17 PM 05/29/2022   11:35 AM 01/27/2022   10:32 AM  Advanced Directives  Does Patient Have a Medical Advance Directive? Yes No Yes Yes Yes Yes Yes  Type of Estate agent of [place];Living will  Healthcare Power of [place];Living will  Healthcare Power of [place];Living will Healthcare Power of [place];Living will  Healthcare Power of [place];Living will  Does patient want to make changes to medical advance directive?    No - Patient declined No - Patient declined No - Patient declined   Copy of Healthcare Power of Attorney in Chart? No - copy requested  No - copy requested No - copy requested No - copy requested  No - copy requested  Would patient like information on creating a medical advance directive?  No - Patient declined    No - Patient declined     Current Medications (verified) Outpatient Encounter Medications as of 02/12/2024  Medication Sig   acetaminophen  (TYLENOL ) 325 MG tablet Take 650 mg by mouth every 6 (six) hours as needed.   Calcium  Carb-Cholecalciferol (CALCIUM  + VITAMIN D3 PO) Take by mouth.   DULoxetine  (CYMBALTA ) 20 MG capsule Take 1 capsule (20 mg total) by mouth daily. TAKE 1 CAPSULE BY MOUTH EVERY DAY   fluticasone  (FLONASE ) 50 MCG/ACT nasal spray SPRAY 1 SPRAY INTO BOTH NOSTRILS DAILY.   hydrochlorothiazide  (HYDRODIURIL ) 25 MG tablet Take 1 tablet (25 mg total) by mouth daily.   ibuprofen (ADVIL) 200 MG tablet Take 200 mg by mouth every 6 (six) hours as needed. Takes 2 once or twice daily   loratadine  (CLARITIN ) 10 MG tablet TAKE 1 TABLET BY MOUTH EVERY DAY   omeprazole  (PRILOSEC) 20 MG capsule TAKE 1 CAPSULE BY MOUTH DAILY BEFORE A MEAL   rosuvastatin  (CRESTOR ) 10 MG tablet Take 1 tablet (10 mg total) by mouth daily.   timolol (TIMOPTIC) 0.5 % ophthalmic solution  Place 1 drop into both eyes 2 (two) times daily.   triamcinolone  cream (KENALOG ) 0.1 % Apply 1 Application topically 2 (two) times daily.   TURMERIC PO Take by mouth.   No facility-administered encounter medications on file as of 02/12/2024.    Allergies (verified) Sulfa  antibiotics, Amlodipine  besylate, Atorvastatin , Lotrel [amlodipine  besy-benazepril  hcl], and Molnupiravir    History: Past Medical History:  Diagnosis Date    Allergic rhinitis    Allergy    Arthritis    Cataract    bil   Colon polyps    Esophagitis    GERD (gastroesophageal reflux disease)    HH (hiatus hernia)    HTN (hypertension)    Hyperlipidemia    Increased pressure in the eye    OA (osteoarthritis)    hands   Ocular tension increased    at risk for glaucoma   Osteoporosis    Rectal pain    Past Surgical History:  Procedure Laterality Date   ABDOMINAL HYSTERECTOMY     still has ovaries   APPENDECTOMY     BREAST CYST ASPIRATION  07/2001   CARPAL TUNNEL RELEASE Right 05/29/2022   Procedure: RIGHT CARPAL TUNNEL RELEASE;  Surgeon: Murrell Drivers, MD;  Location: [facility];  Service: Orthopedics;  Laterality: Right;  Bier block   CARPAL TUNNEL RELEASE Left 09/28/2022   Procedure: LEFT CARPAL TUNNEL RELEASE;  Surgeon: Murrell Drivers, MD;  Location: [facility];  Service: Orthopedics;  Laterality: Left;  45 MIN   CATARACT EXTRACTION  03/2006   CYST EXCISION Left 09/28/2022   Procedure: LEFT LONG FINGER EXCISION ANNULAR LIGAMENT CYST;  Surgeon: Murrell Drivers, MD;  Location: [facility];  Service: Orthopedics;  Laterality: Left;   ESOPHAGOGASTRODUODENOSCOPY  05/2007   HEMORRHOID SURGERY     KNEE ARTHROSCOPY WITH MEDIAL MENISECTOMY Left 03/27/2023   Procedure: KNEE ARTHROSCOPY WITH MEDIAL MENISECTOMY;  Surgeon: Josefina Chew, MD;  Location: [facility];  Service: Orthopedics;  Laterality: Left;   KNEE ARTHROSCOPY WITH MENISCAL REPAIR  2021   rectal sphincterotomy     Retinal laser surgery     TRIGGER FINGER RELEASE Left 09/28/2022   Procedure: RELEASE TRIGGER FINGER/A-1 PULLEY LEFT LONG FINGER;  Surgeon: Murrell Drivers, MD;  Location: [facility];  Service: Orthopedics;  Laterality: Left;   UPPER GASTROINTESTINAL ENDOSCOPY     Family History  Problem Relation Age of Onset   Thyroid  disease Mother    Depression Mother    Hypertension Mother    Osteoporosis Mother     Colon cancer Father    Pneumonia Father    Liver disease Brother        from alcohol   Alcohol abuse Brother        terminal    Alcohol abuse Brother    Breast cancer Maternal Aunt    Breast cancer Paternal Aunt    Breast cancer Paternal Aunt    Thyroid  disease Other        half sister   Colon cancer Other        Aunt   Esophageal cancer Neg Hx    Stomach cancer Neg Hx    Rectal cancer Neg Hx    Social History   Socioeconomic History   Marital status: Widowed    Spouse name: Not on file   Number of children: 2   Years of education: Not on file   Highest education level: Not on file  Occupational History   Occupation: Retired  Employer: RETIRED  Tobacco Use   Smoking status: Never   Smokeless tobacco: Never  Vaping Use   Vaping status: Never Used  Substance and Sexual Activity   Alcohol use: No    Alcohol/week: 0.0 standard drinks of alcohol   Drug use: No   Sexual activity: Not Currently    Partners: Male    Birth control/protection: Surgical  Other Topics Concern   Not on file  Social History Narrative   Married      2 children      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 with husband,  and cares for her brother--who had alcohol and stroke      Cared for elderly mother for years (she passed in 2009)   Social Drivers of Health   Financial Resource Strain: Low Risk  (02/12/2024)   Overall Financial Resource Strain (CARDIA)    Difficulty of Paying Living Expenses: Not hard at all  Food Insecurity: No Food Insecurity (02/12/2024)   Hunger Vital Sign    Worried About Running Out of Food in the Last Year: Never true    Ran Out of Food in the Last Year: Never true  Transportation Needs: No Transportation Needs (02/12/2024)   PRAPARE - Administrator, Civil Service (Medical): No    Lack of Transportation (Non-Medical): No  Physical Activity: Inactive (02/12/2024)   Exercise Vital Sign    Days of Exercise per Week: 0 days    Minutes of Exercise per Session:  0 min  Stress: No Stress Concern Present (02/06/2023)   [employer] of Occupational Health - Occupational Stress Questionnaire    Feeling of Stress : Not at all  Social Connections: Moderately Isolated (02/12/2024)   Social Connection and Isolation Panel    Frequency of Communication with Friends and Family: More than three times a week    Frequency of Social Gatherings with Friends and Family: More than three times a week    Attends Religious Services: More than 4 times per year    Active Member of [employer] or Organizations: No    Attends Banker Meetings: Never    Marital Status: Widowed    Tobacco Counseling Counseling given: Not Answered    Clinical Intake:  Pre-visit preparation completed: Yes  Pain : No/denies pain     Nutritional Risks: None Diabetes: No  No results found for: HGBA1C   How often do you need to have someone help you when you read instructions, pamphlets, or other written materials from your doctor or pharmacy?: 1 - Never  Interpreter Needed?: No  Information entered by :: NAllen LPN   Activities of Daily Living     02/12/2024   10:47 AM 03/27/2023    9:11 AM  In your present state of health, do you have any difficulty performing the following activities:  Hearing? 1 0  Comment decreased hearing 1 ear   Vision? 0 0  Difficulty concentrating or making decisions? 0 0  Walking or climbing stairs? 0 0  Dressing or bathing? 0 0  Doing errands, shopping? 0   Preparing Food and eating ? N   Using the Toilet? N   In the past six months, have you accidently leaked urine? Y   Comment trouble at night sometimes   Do you have problems with loss of bowel control? N   Managing your Medications? N   Managing your Finances? N   Housekeeping or managing your Housekeeping? N     Patient  Care Team: Tysinger, Alm GORMAN RIGGERS as PCP - General (Family Medicine)  I have updated your Care Teams any recent Medical Services you may have received  from other providers in the past year.     Assessment:   This is a routine wellness examination for Deborah Gardner.  Hearing/Vision screen Hearing Screening - Comments:: States decreased hearing in 1 ear Vision Screening - Comments:: Regular eye exams, Dr. Glendia   Goals Addressed             This Visit's Progress    Patient Stated       02/12/2024, denies goals       Depression Screen     02/12/2024   10:54 AM 02/07/2024   10:41 AM 02/06/2023   10:40 AM 10/25/2022   10:43 AM 05/22/2022   11:31 AM 01/27/2022   10:33 AM 09/27/2021    1:01 PM  PHQ 2/9 Scores  PHQ - 2 Score 0 0 0 0 0 0 0  PHQ- 9 Score 4  0   0     Fall Risk     02/12/2024   10:54 AM 02/07/2024   10:41 AM 02/06/2023   10:39 AM 10/25/2022   10:42 AM 05/22/2022   11:31 AM  Fall Risk   Falls in the past year? 0 0 0 0 0  Number falls in past yr: 0 0 0 0 0  Injury with Fall? 0 0 0 0 0  Risk for fall due to : Medication side effect No Fall Risks Medication side effect;Impaired mobility No Fall Risks No Fall Risks  Follow up Falls evaluation completed;Falls prevention discussed Falls evaluation completed Falls prevention discussed;Falls evaluation completed Falls evaluation completed Falls evaluation completed      Data saved with a previous flowsheet row definition    MEDICARE RISK AT HOME:  Medicare Risk at Home Any stairs in or around the home?: No If so, are there any without handrails?: No Home free of loose throw rugs in walkways, pet beds, electrical cords, etc?: Yes Adequate lighting in your home to reduce risk of falls?: Yes Life alert?: No Use of a cane, walker or w/c?: No Grab bars in the bathroom?: Yes Shower chair or bench in shower?: No Elevated toilet seat or a handicapped toilet?: Yes  TIMED UP AND GO:  Was the test performed?  No  Cognitive Function: 6CIT completed    01/24/2021    2:47 PM 05/02/2018    3:33 PM 04/26/2017    8:30 AM 04/25/2016   10:13 AM  MMSE - Mini Mental State Exam   Orientation to time 5 5 5  5    Orientation to Place 5 5 5  5    Registration 3 3 3  3    Attention/ Calculation 5 0 0  0   Recall 3 3 3  3    Language- name 2 objects  0 0  0   Language- repeat 1 1 1 1   Language- follow 3 step command  3 3  3    Language- read & follow direction  0 0  0   Write a sentence  0 0  0   Copy design  0 0  0   Total score  20 20  20       Data saved with a previous flowsheet row definition        02/12/2024   10:55 AM 02/06/2023   10:40 AM 01/27/2022   10:36 AM  6CIT Screen  What Year? 0 points  0 points 0 points  What month? 0 points 0 points 0 points  What time? 0 points 0 points 0 points  Count back from 20 0 points 0 points 0 points  Months in reverse 0 points 0 points 0 points  Repeat phrase 0 points 2 points 4 points  Total Score 0 points 2 points 4 points    Immunizations Immunization History  Administered Date(s) Administered   Fluad Quad(high Dose 65+) 03/18/2020, 05/11/2022   Influenza Split 03/11/2011, 03/28/2012, 04/10/2016   Influenza Whole 05/13/2010   Influenza, High Dose Seasonal PF 05/01/2013, 05/18/2014, 09/10/2015, 04/10/2016, 04/12/2017, 05/07/2018, 04/02/2019, 04/20/2021, 06/15/2023   PFIZER(Purple Top)SARS-COV-2 Vaccination 09/22/2019, 10/13/2019   Pneumococcal Conjugate-13 12/02/2014   Pneumococcal Polysaccharide-23 03/14/2011   Td 08/30/2004   Zoster, Live 11/24/2009    Screening Tests Health Maintenance  Topic Date Due   INFLUENZA VACCINE  02/08/2024   COVID-19 Vaccine (3 - 2024-25 season) 02/23/2024 (Originally 03/11/2023)   Zoster Vaccines- Shingrix (1 of 2) 05/09/2024 (Originally 07/13/1993)   MAMMOGRAM  02/06/2025 (Originally 10/05/2022)   Medicare Annual Wellness (AWV)  02/11/2025   Colonoscopy  10/22/2028   Pneumococcal Vaccine: 50+ Years  Completed   DEXA SCAN  Completed   Hepatitis B Vaccines  Aged Out   HPV VACCINES  Aged Out   Meningococcal B Vaccine  Aged Out   DTaP/Tdap/Td  Discontinued   Hepatitis C  Screening  Discontinued    Health Maintenance  Health Maintenance Due  Topic Date Due   INFLUENZA VACCINE  02/08/2024   Health Maintenance Items Addressed: Declines vaccines.  Additional Screening:  Vision Screening: Recommended annual ophthalmology exams for early detection of glaucoma and other disorders of the eye. Would you like a referral to an eye doctor? No    Dental Screening: Recommended annual dental exams for proper oral hygiene  Community Resource Referral / Chronic Care Management: CRR required this visit?  No   CCM required this visit?  No   Plan:    I have personally reviewed and noted the following in the patient's chart:   Medical and social history Use of alcohol, tobacco or illicit drugs  Current medications and supplements including opioid prescriptions. Patient is not currently taking opioid prescriptions. Functional ability and status Nutritional status Physical activity Advanced directives List of other physicians Hospitalizations, surgeries, and ER visits in previous 12 months Vitals Screenings to include cognitive, depression, and falls Referrals and appointments  In addition, I have reviewed and discussed with patient certain preventive protocols, quality metrics, and best practice recommendations. A written personalized care plan for preventive services as well as general preventive health recommendations were provided to patient.   Ardella FORBES Dawn, LPN   07/13/7972   After Visit Summary: (Pick Up) Due to this being a telephonic visit, with patients personalized plan was offered to patient and patient has requested to Pick up at office.  Notes: Nothing significant to report at this time.

## 2024-02-12 NOTE — Patient Instructions (Signed)
 Deborah Gardner , Thank you for taking time out of your busy schedule to complete your Annual Wellness Visit with me. I enjoyed our conversation and look forward to speaking with you again next year. I, as well as your care team,  appreciate your ongoing commitment to your health goals. Please review the following plan we discussed and let me know if I can assist you in the future. Your Game plan/ To Do List    Referrals: If you haven't heard from the office you've been referred to, please reach out to them at the phone provided.   Follow up Visits: We will see or speak with you next year for your Next Medicare AWV with our clinical staff Have you seen your provider in the last 6 months (3 months if uncontrolled diabetes)? Yes  Clinician Recommendations:  Aim for 30 minutes of exercise or brisk walking, 6-8 glasses of water, and 5 servings of fruits and vegetables each day.       This is a list of the screenings recommended for you:  Health Maintenance  Topic Date Due   Flu Shot  02/08/2024   COVID-19 Vaccine (3 - 2024-25 season) 02/23/2024*   Zoster (Shingles) Vaccine (1 of 2) 05/09/2024*   Mammogram  02/06/2025*   Medicare Annual Wellness Visit  02/11/2025   Colon Cancer Screening  10/22/2028   Pneumococcal Vaccine for age over 72  Completed   DEXA scan (bone density measurement)  Completed   Hepatitis B Vaccine  Aged Out   HPV Vaccine  Aged Out   Meningitis B Vaccine  Aged Out   DTaP/Tdap/Td vaccine  Discontinued   Hepatitis C Screening  Discontinued  *Topic was postponed. The date shown is not the original due date.    Advanced directives: (Copy Requested) Please bring a copy of your health care power of attorney and living will to the office to be added to your chart at your convenience. You can mail to [facility] [address] [place], [place] [address] or email to [email or web address] Advance Care Planning is important because it:  [x]  Makes sure you  receive the medical care that is consistent with your values, goals, and preferences  [x]  It provides guidance to your family and loved ones and reduces their decisional burden about whether or not they are making the right decisions based on your wishes.  Follow the link provided in your after visit summary or read over the paperwork we have mailed to you to help you started getting your Advance Directives in place. If you need assistance in completing these, please reach out to us  so that we can help you!  See attachments for Preventive Care and Fall Prevention Tips.

## 2024-02-20 DIAGNOSIS — M9901 Segmental and somatic dysfunction of cervical region: Secondary | ICD-10-CM | POA: Diagnosis not present

## 2024-02-20 DIAGNOSIS — M5031 Other cervical disc degeneration,  high cervical region: Secondary | ICD-10-CM | POA: Diagnosis not present

## 2024-02-27 DIAGNOSIS — M9901 Segmental and somatic dysfunction of cervical region: Secondary | ICD-10-CM | POA: Diagnosis not present

## 2024-02-27 DIAGNOSIS — M5031 Other cervical disc degeneration,  high cervical region: Secondary | ICD-10-CM | POA: Diagnosis not present

## 2024-04-07 ENCOUNTER — Ambulatory Visit: Admitting: Medical

## 2024-04-07 VITALS — BP 120/76 | HR 60 | Temp 97.4°F | Wt 149.2 lb

## 2024-04-07 DIAGNOSIS — R5381 Other malaise: Secondary | ICD-10-CM

## 2024-04-07 DIAGNOSIS — R0789 Other chest pain: Secondary | ICD-10-CM | POA: Diagnosis not present

## 2024-04-07 DIAGNOSIS — R251 Tremor, unspecified: Secondary | ICD-10-CM

## 2024-04-07 DIAGNOSIS — R110 Nausea: Secondary | ICD-10-CM

## 2024-04-07 LAB — POCT URINALYSIS DIP (PROADVANTAGE DEVICE)
Bilirubin, UA: NEGATIVE
Blood, UA: NEGATIVE
Glucose, UA: NEGATIVE mg/dL
Ketones, POC UA: NEGATIVE mg/dL
Leukocytes, UA: NEGATIVE
Nitrite, UA: NEGATIVE
Protein Ur, POC: NEGATIVE mg/dL
Specific Gravity, Urine: 1.01
Urobilinogen, Ur: NEGATIVE
pH, UA: 7 (ref 5.0–8.0)

## 2024-04-07 NOTE — Progress Notes (Signed)
 Name: Deborah Gardner   Date of Visit: 04/07/24   Date of last visit with me: 02/11/2024   CHIEF COMPLAINT:  Chief Complaint  Patient presents with   Acute Visit    Not feeling well- weak in legs, doesn't feel right. Would like urine check just incase anything is abnormal       HPI:  Discussed the use of AI scribe software for clinical note transcription with the patient, who gave verbal consent to proceed.  History of Present Illness   History of Present Illness Deborah Gardner is an 78 year old female who presents with generalized weakness and tremulousness.  She describes a progressive worsening of generalized weakness and tremulousness over the last few days, with her legs feeling 'trembly like rubber' and 'a little bit shaky.' She also experiences a 'funny feeling' in her heart, described as a 'little bit of a weight,' but denies any pain. No SOB or dyspnea.  She takes her medications regularly, including morning medications and eye drops, and uses ibuprofen for arthritis pain as needed, though she has not taken any in the last few days. She denies any recent illness exposure and reports sleeping well, aided by occasional use of a sleep aid. She eats twice a day with a snack for lunch. Her mood is fine, though she feels 'off' and 'not like me.'  She reports occasional nighttime or early morning cough described as a 'tickle in my throat,' occasional headaches, and some nausea. No shortness of breath, wheezing, sore throat, ear pain, urinary symptoms, abdominal pain, back pain, diarrhea, vomiting, blood in the stool, changes in speech, increased thirst, or eye symptoms.  She mentions a hearing issue, particularly with her right ear, which she notices when lying on her left side. She recalls a slight hearing loss from years back and wonders if it could be due to wax.  No other aggravating or relieving factors. No other complaint.   Past Medical History:  Diagnosis Date    Allergic rhinitis    Allergy    Arthritis    Cataract    bil   Colon polyps    Esophagitis    GERD (gastroesophageal reflux disease)    HH (hiatus hernia)    HTN (hypertension)    Hyperlipidemia    Increased pressure in the eye    OA (osteoarthritis)    hands   Ocular tension increased    at risk for glaucoma   Osteoporosis    Rectal pain    Current Outpatient Medications on File Prior to Visit  Medication Sig Dispense Refill   acetaminophen  (TYLENOL ) 325 MG tablet Take 650 mg by mouth every 6 (six) hours as needed.     Calcium  Carb-Cholecalciferol (CALCIUM  + VITAMIN D3 PO) Take by mouth.     DULoxetine  (CYMBALTA ) 20 MG capsule Take 1 capsule (20 mg total) by mouth daily. TAKE 1 CAPSULE BY MOUTH EVERY DAY 90 capsule 1   fluticasone  (FLONASE ) 50 MCG/ACT nasal spray SPRAY 1 SPRAY INTO BOTH NOSTRILS DAILY. 48 mL 2   hydrochlorothiazide  (HYDRODIURIL ) 25 MG tablet Take 1 tablet (25 mg total) by mouth daily. 90 tablet 2   ibuprofen (ADVIL) 200 MG tablet Take 200 mg by mouth every 6 (six) hours as needed. Takes 2 once or twice daily     loratadine  (CLARITIN ) 10 MG tablet TAKE 1 TABLET BY MOUTH EVERY DAY 90 tablet 0   omeprazole  (PRILOSEC) 20 MG capsule TAKE 1 CAPSULE BY MOUTH DAILY BEFORE  A MEAL 90 capsule 2   rosuvastatin  (CRESTOR ) 10 MG tablet Take 1 tablet (10 mg total) by mouth daily. 90 tablet 2   timolol (TIMOPTIC) 0.5 % ophthalmic solution Place 1 drop into both eyes 2 (two) times daily.     TURMERIC PO Take by mouth.     No current facility-administered medications on file prior to visit.      Objective: BP 120/76   Pulse 60   Temp (!) 97.4 F (36.3 C)   Wt 149 lb 3.2 oz (67.7 kg)   BMI 29.63 kg/m   Wt Readings from Last 3 Encounters:  04/07/24 149 lb 3.2 oz (67.7 kg)  02/07/24 149 lb 6.4 oz (67.8 kg)  10/23/23 153 lb (69.4 kg)    Review of Systems  Constitutional:  Positive for malaise/fatigue. Negative for chills, fever and weight loss.  HENT:  Negative  for congestion, ear pain and sore throat.   Eyes:  Negative for blurred vision, pain and redness.  Respiratory:  Positive for cough. Negative for hemoptysis and shortness of breath.        Tickle in throat  Cardiovascular:  Negative for chest pain, palpitations, orthopnea, claudication and leg swelling.  Gastrointestinal:  Positive for nausea. Negative for abdominal pain, blood in stool, constipation, diarrhea and vomiting.  Genitourinary:  Negative for dysuria, flank pain, frequency, hematuria and urgency.  Musculoskeletal:  Negative for myalgias.  Neurological:  Positive for weakness and headaches. Negative for dizziness, tingling and speech change.  Endo/Heme/Allergies:  Negative for polydipsia. Does not bruise/bleed easily.     Objective: BP 120/76   Pulse 60   Temp (!) 97.4 F (36.3 C)   Wt 149 lb 3.2 oz (67.7 kg)   BMI 29.63 kg/m   General appearence: alert, no distress, WD/WN HEENT: normocephalic, sclerae anicteric, PERRLA, EOMi, nares patent, no discharge or erythema, pharynx normal Oral cavity: MMM, no lesions Neck: supple, no lymphadenopathy, no thyromegaly, no masses, no bruits Heart: RRR, normal S1, S2, no murmurs Lungs: CTA bilaterally, no wheezes, rhonchi, or rales Abdomen: +bs, soft, non tender, non distended, no masses, no hepatomegaly, no splenomegaly Back: non tender Musculoskeletal: arthritis changes of fingers bilat throughout, otherwise nontender, no swelling, no obvious deformity Extremities: no edema, no cyanosis, no clubbing Pulses: 2+ symmetric, upper and lower extremities, normal cap refill Neurological: alert, oriented x 3, CN2-12 intact, strength normal upper extremities and lower extremities, sensation normal throughout, DTRs 2+ throughout, no cerebellar signs, gait normal Psychiatric: normal affect, behavior normal, pleasant   EKG reviewed, abnormal but unchanged since 2023   Assessment: Encounter Diagnoses  Name Primary?   Malaise Yes   Chest  heaviness    Nausea    Shakiness      Plan: Generalized weakness and tremulousness Progressive weakness and tremulousness in legs, no falls, no observed tremors. - Order EKG. - Evaluate urine sample. -labs as below -consider Zio event monitor testing  Headache Intermittent headaches, no neurological symptoms.  Nausea Mild nausea, no vomiting or diarrhea.  Chest heaviness -EKG reviewed -labs as below  [facility] -labs as below   Avelina Pries was seen today for acute visit.  Diagnoses and all orders for this visit:  Malaise -     POCT Urinalysis DIP (Proadvantage Device) -     Basic metabolic panel with GFR -     CBC -     Magnesium  Chest heaviness -     EKG 12-Lead  Nausea -     POCT Urinalysis DIP (Proadvantage  Device)  Shakiness -     Basic metabolic panel with GFR -     CBC -     Magnesium    F/u pending labs

## 2024-04-08 ENCOUNTER — Other Ambulatory Visit: Payer: Self-pay | Admitting: Internal Medicine

## 2024-04-08 ENCOUNTER — Ambulatory Visit: Payer: Self-pay | Admitting: Medical

## 2024-04-08 ENCOUNTER — Ambulatory Visit: Attending: Medical

## 2024-04-08 DIAGNOSIS — R5381 Other malaise: Secondary | ICD-10-CM

## 2024-04-08 DIAGNOSIS — R0789 Other chest pain: Secondary | ICD-10-CM

## 2024-04-08 DIAGNOSIS — R9431 Abnormal electrocardiogram [ECG] [EKG]: Secondary | ICD-10-CM

## 2024-04-08 LAB — CBC
Hematocrit: 35.2 % (ref 34.0–46.6)
Hemoglobin: 11.6 g/dL (ref 11.1–15.9)
MCH: 29.7 pg (ref 26.6–33.0)
MCHC: 33 g/dL (ref 31.5–35.7)
MCV: 90 fL (ref 79–97)
Platelets: 238 x10E3/uL (ref 150–450)
RBC: 3.9 x10E6/uL (ref 3.77–5.28)
RDW: 12.3 % (ref 11.7–15.4)
WBC: 8.8 x10E3/uL (ref 3.4–10.8)

## 2024-04-08 LAB — BASIC METABOLIC PANEL WITH GFR
BUN/Creatinine Ratio: 16 (ref 12–28)
BUN: 14 mg/dL (ref 8–27)
CO2: 25 mmol/L (ref 20–29)
Calcium: 9.8 mg/dL (ref 8.7–10.3)
Chloride: 98 mmol/L (ref 96–106)
Creatinine, Ser: 0.88 mg/dL (ref 0.57–1.00)
Glucose: 95 mg/dL (ref 70–99)
Potassium: 3.8 mmol/L (ref 3.5–5.2)
Sodium: 136 mmol/L (ref 134–144)
eGFR: 66 mL/min/1.73 (ref >59)

## 2024-04-08 LAB — MAGNESIUM: Magnesium: 2 mg/dL (ref 1.6–2.3)

## 2024-04-08 NOTE — Progress Notes (Signed)
 Blood sugar and electrolytes okay, blood counts okay, magnesium okay.  Lets move forward with referral to cardiology and referral to Zio patch event monitor

## 2024-04-08 NOTE — Addendum Note (Signed)
 Addended by: VICCI HUSBAND A on: 04/08/2024 01:29 PM   Modules accepted: Orders

## 2024-04-08 NOTE — Progress Notes (Signed)
 The abnormal findings included something called a right bundle branch block which is an electrical abnormal finding of the heart but it is not new in her case.  This by itself may or may not be an issue.  There is a questionable prior infarct or damage to the heart based on the electrical changes of the EKG.  This is not always 100%.  We do not rely on the EKG only to make this distinction.  That is where other testing comes in such as ultrasound of the heart  There is a finding called T wave inversion that can sometimes suggest decreased blood flow to parts of the heart  Given her overall symptoms I think the next appropriate step would be a baseline cardiology consult and a ZIO event monitor to monitor her heart rhythm around the clock for the next week to watch for any changes such as arrhythmia

## 2024-04-08 NOTE — Progress Notes (Unsigned)
 EP to read.

## 2024-05-03 ENCOUNTER — Other Ambulatory Visit: Payer: Self-pay | Admitting: Medical

## 2024-05-03 DIAGNOSIS — R0789 Other chest pain: Secondary | ICD-10-CM | POA: Diagnosis not present

## 2024-05-05 ENCOUNTER — Telehealth: Payer: Self-pay | Admitting: Family Medicine

## 2024-05-05 DIAGNOSIS — R0789 Other chest pain: Secondary | ICD-10-CM

## 2024-05-05 NOTE — Telephone Encounter (Signed)
 Deborah Gardner called to know if we have received results yet from her Heart Monitor.  I advised her they came in late on Friday. Please call pt cell to give results.

## 2024-05-05 NOTE — Progress Notes (Signed)
 Event monitor shows average heart rate 68 bpm, no A-fib, no serious abnormalities.  There was a few segments of fast heart rate but it was brief.  Continue plan to see cardiology in consult.  Referral already placed last visit

## 2024-05-07 DIAGNOSIS — H5213 Myopia, bilateral: Secondary | ICD-10-CM | POA: Diagnosis not present

## 2024-05-12 NOTE — Telephone Encounter (Signed)
 Called Deborah Gardner to see if she had heard from the heart monitor and she said she had.  She has not heard from [facility], I gave her their number and she will call them.

## 2024-05-28 ENCOUNTER — Ambulatory Visit: Admitting: Cardiology

## 2024-05-29 ENCOUNTER — Other Ambulatory Visit (HOSPITAL_COMMUNITY): Payer: Self-pay

## 2024-05-29 ENCOUNTER — Encounter: Payer: Self-pay | Admitting: Cardiovascular Disease

## 2024-05-29 ENCOUNTER — Ambulatory Visit: Attending: Cardiovascular Disease | Admitting: Cardiovascular Disease

## 2024-05-29 VITALS — BP 126/78 | HR 62 | Ht 59.5 in | Wt 150.4 lb

## 2024-05-29 DIAGNOSIS — R072 Precordial pain: Secondary | ICD-10-CM | POA: Diagnosis not present

## 2024-05-29 DIAGNOSIS — I4729 Other ventricular tachycardia: Secondary | ICD-10-CM | POA: Diagnosis not present

## 2024-05-29 DIAGNOSIS — R079 Chest pain, unspecified: Secondary | ICD-10-CM

## 2024-05-29 MED ORDER — METOPROLOL TARTRATE 50 MG PO TABS
ORAL_TABLET | ORAL | 0 refills | Status: AC
  Filled 2024-05-29: qty 1, 1d supply, fill #0

## 2024-05-29 NOTE — Patient Instructions (Addendum)
 Medication Instructions:  Your physician recommends that you continue on your current medications as directed. Please refer to the Current Medication list given to you today.  *If you need a refill on your cardiac medications before your next appointment, please call your pharmacy*  Lab Work: none If you have labs (blood work) drawn today and your tests are completely normal, you will receive your results only by: MyChart Message (if you have MyChart) OR A paper copy in the mail If you have any lab test that is abnormal or we need to change your treatment, we will call you to review the results.  Testing/Procedures: Your physician has requested that you have an echocardiogram. Echocardiography is a painless test that uses sound waves to create images of your heart. It provides your doctor with information about the size and shape of your heart and how well your heart's chambers and valves are working. This procedure takes approximately one hour. There are no restrictions for this procedure. Please do NOT wear cologne, perfume, aftershave, or lotions (deodorant is allowed). Please arrive 15 minutes prior to your appointment time.  Please note: We ask at that you not bring children with you during ultrasound (echo/ vascular) testing. Due to room size and safety concerns, children are not allowed in the ultrasound rooms during exams. Our front office staff cannot provide observation of children in our lobby area while testing is being conducted. An adult accompanying a patient to their appointment will only be allowed in the ultrasound room at the discretion of the ultrasound technician under special circumstances. We apologize for any inconvenience.  Your physician has requested that you have cardiac CT. Cardiac computed tomography (CT) is a painless test that uses an x-ray machine to take clear, detailed pictures of your heart. For further information please visit [email or web address] Please follow  instruction sheet as given.    Follow-Up: At [facility], you and your health needs are our priority.  As part of our continuing mission to provide you with exceptional heart care, our providers are all part of one team.  This team includes your primary Cardiologist (physician) and Advanced Practice Providers or APPs (Physician Assistants and Nurse Practitioners) who all work together to provide you with the care you need, when you need it.  Your next appointment:   12 month(s)  Provider:   Lonni Cash, MD    We recommend signing up for the patient portal called MyChart.  Sign up information is provided on this After Visit Summary.  MyChart is used to connect with patients for Virtual Visits (Telemedicine).  Patients are able to view lab/test results, encounter notes, upcoming appointments, etc.  Non-urgent messages can be sent to your provider as well.   To learn more about what you can do with MyChart, go to [email or web address].   Other Instructions        Your cardiac CT will be scheduled at one of the below locations:   [facility] [address] [place], [place] [address] [phone] (Severe contrast allergies only)  OR   [facility] [address] [place], [place] [address] [phone]  OR   [facility] [address] [place], [place] [address] [phone]  OR   [facility] [address]  [place], [place] [address]  OR   [facility] [address] [place], [place] [phone]  If scheduled at [facility], please arrive at the [facility] and  Children's Entrance (Entrance C2) of [facility] 30 minutes prior to test start time. You can use the FREE valet parking offered at entrance C (encouraged to control the heart rate for the test)  Proceed to the [facility] Radiology Department (first floor) to check-in and test prep.  All  radiology patients and guests should use entrance C2 at [facility], accessed from [facility], even though the hospital's physical address listed is [address].  If scheduled at the Heart and Vascular Tower at [address], please enter the parking lot using the [address] entrance and use the FREE valet service at the patient drop-off area. Enter the building and check-in with registration on the main floor.  If scheduled at [facility], please arrive to the Heart and Vascular Center 15 mins early for check-in and test prep.  There is spacious parking and easy access to the radiology department from the [facility] V[facility] entrance. Please enter here and check-in with the desk attendant.   If scheduled at [facility], please arrive 30 minutes early for check-in and test prep.  Please follow these instructions carefully (unless otherwise directed):  An IV will be required for this test and Nitroglycerin will be given.  Hold all erectile dysfunction medications at least 3 days (72 hrs) prior to test. (Ie viagra, cialis, sildenafil, tadalafil, etc)   On the Night Before the Test: Be sure to Drink plenty of water. Do not consume any caffeinated/decaffeinated beverages or chocolate 12 hours prior to your test. Do not take any antihistamines 12 hours prior to your test.   On the Day of the Test: Drink plenty of water until 1 hour prior to the test. Do not eat any food 1 hour prior to test. You may take your regular medications prior to the test.  Take metoprolol  (Lopressor ) two hours prior to test. If you take Furosemide/Hydrochlorothiazide /Spironolactone/Chlorthalidone, please HOLD on the morning of the test. Patients who wear a continuous glucose monitor MUST remove the device prior to scanning. FEMALES- please wear underwire-free bra if available, avoid dresses & tight clothing        After the Test: Drink  plenty of water. After receiving IV contrast, you may experience a mild flushed feeling. This is normal. On occasion, you may experience a mild rash up to 24 hours after the test. This is not dangerous. If this occurs, you can take Benadryl 25 mg, Zyrtec, Claritin , or Allegra and increase your fluid intake. (Patients taking Tikosyn should avoid Benadryl, and may take Zyrtec, Claritin , or Allegra) If you experience trouble breathing, this can be serious. If it is severe call 911 IMMEDIATELY. If it is mild, please call our office.  We will call to schedule your test 2-4 weeks out understanding that some insurance companies will need an authorization prior to the service being performed.   For more information and frequently asked questions, please visit our website : [email or web address]  For non-scheduling related questions, please contact the cardiac imaging nurse navigator should you have any questions/concerns: Cardiac Imaging Nurse Navigators Direct Office Dial: [phone]   For scheduling needs, including cancellations and rescheduling, please call Brittany, [phone].

## 2024-05-29 NOTE — Progress Notes (Signed)
 Chief Complaint  Patient presents with   New Patient (Initial Visit)    Chest pain   History of Present Illness: 78 yo female with history of esophagitis, GERD, hiatal hernia, HTN and HLD who is here today as a new patient, referred by Alm Gent, PA-C, for the evaluation of chest pain and fatigue. Cardiac monitor October 2025 with sinus, SVT (longest 15 beats) and 2 runs of VT (longest 14 beats).  She tells me today that she has resting and exertional chest heaviness. No dyspnea, palpitations, LE edema, dizziness or near syncope.   Primary Care Physician: Gent Alm GORMAN DEVONNA   Past Medical History:  Diagnosis Date   Allergic rhinitis    Allergy    Arthritis    Cataract    bil   Colon polyps    Esophagitis    GERD (gastroesophageal reflux disease)    HH (hiatus hernia)    HTN (hypertension)    Hyperlipidemia    Increased pressure in the eye    OA (osteoarthritis)    hands   Ocular tension increased    at risk for glaucoma   Osteoporosis    Rectal pain     Past Surgical History:  Procedure Laterality Date   ABDOMINAL HYSTERECTOMY     still has ovaries   APPENDECTOMY     BREAST CYST ASPIRATION  07/2001   CARPAL TUNNEL RELEASE Right 05/29/2022   Procedure: RIGHT CARPAL TUNNEL RELEASE;  Surgeon: Murrell Drivers, MD;  Location: [facility];  Service: Orthopedics;  Laterality: Right;  Bier block   CARPAL TUNNEL RELEASE Left 09/28/2022   Procedure: LEFT CARPAL TUNNEL RELEASE;  Surgeon: Murrell Drivers, MD;  Location: [facility];  Service: Orthopedics;  Laterality: Left;  45 MIN   CATARACT EXTRACTION  03/2006   CYST EXCISION Left 09/28/2022   Procedure: LEFT LONG FINGER EXCISION ANNULAR LIGAMENT CYST;  Surgeon: Murrell Drivers, MD;  Location: [facility];  Service: Orthopedics;  Laterality: Left;   ESOPHAGOGASTRODUODENOSCOPY  05/2007   HEMORRHOID SURGERY     KNEE ARTHROSCOPY WITH MEDIAL MENISECTOMY Left 03/27/2023   Procedure: KNEE  ARTHROSCOPY WITH MEDIAL MENISECTOMY;  Surgeon: Josefina Chew, MD;  Location: [facility];  Service: Orthopedics;  Laterality: Left;   KNEE ARTHROSCOPY WITH MENISCAL REPAIR  2021   rectal sphincterotomy     Retinal laser surgery     TRIGGER FINGER RELEASE Left 09/28/2022   Procedure: RELEASE TRIGGER FINGER/A-1 PULLEY LEFT LONG FINGER;  Surgeon: Murrell Drivers, MD;  Location: [facility];  Service: Orthopedics;  Laterality: Left;   UPPER GASTROINTESTINAL ENDOSCOPY      Current Outpatient Medications  Medication Sig Dispense Refill   acetaminophen  (TYLENOL ) 325 MG tablet Take 650 mg by mouth every 6 (six) hours as needed.     Calcium  Carb-Cholecalciferol (CALCIUM  + VITAMIN D3 PO) Take by mouth.     DULoxetine  (CYMBALTA ) 20 MG capsule Take 1 capsule (20 mg total) by mouth daily. TAKE 1 CAPSULE BY MOUTH EVERY DAY 90 capsule 1   fluticasone  (FLONASE ) 50 MCG/ACT nasal spray SPRAY 1 SPRAY INTO BOTH NOSTRILS DAILY. 48 mL 2   hydrochlorothiazide  (HYDRODIURIL ) 25 MG tablet Take 1 tablet (25 mg total) by mouth daily. 90 tablet 2   ibuprofen (ADVIL) 200 MG tablet Take 200 mg by mouth every 6 (six) hours as needed. Takes 2 once or twice daily     loratadine  (CLARITIN ) 10 MG tablet TAKE 1 TABLET BY MOUTH EVERY  DAY 90 tablet 0   metoprolol tartrate (LOPRESSOR) 50 MG tablet Take one tablet by mouth 2 hours prior to CT scan 1 tablet 0   omeprazole  (PRILOSEC) 20 MG capsule TAKE 1 CAPSULE BY MOUTH DAILY BEFORE A MEAL 90 capsule 2   rosuvastatin  (CRESTOR ) 10 MG tablet Take 1 tablet (10 mg total) by mouth daily. 90 tablet 2   timolol (TIMOPTIC) 0.5 % ophthalmic solution Place 1 drop into both eyes 2 (two) times daily.     TURMERIC PO Take by mouth.     No current facility-administered medications for this visit.    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    Employer: RETIRED  Tobacco Use   Smoking status: Never   Smokeless tobacco: Never  Vaping Use   Vaping status: Never Used  Substance and Sexual Activity   Alcohol use: No    Alcohol/week: 0.0 standard drinks of alcohol   Drug use: No   Sexual activity: Not Currently    Partners: Male    Birth control/protection: Surgical  Other Topics Concern   Not on file  Social History Narrative   Married      2 children      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 with husband,  and cares for her brother--who had alcohol and stroke      Cared for elderly mother for years (she passed in 2009)   Social Drivers of Health   Financial Resource Strain: Low Risk  (02/12/2024)   Overall Financial Resource Strain (CARDIA)    Difficulty of Paying Living Expenses: Not hard at all  Food Insecurity: No Food Insecurity (02/12/2024)   Hunger Vital Sign    Worried About Running Out of Food in the Last Year: Never true    Ran Out of Food in the Last Year: Never true  Transportation Needs: No Transportation Needs (02/12/2024)   PRAPARE - Administrator, Civil Service (Medical): No    Lack of Transportation (Non-Medical): No  Physical Activity: Inactive (02/12/2024)   Exercise Vital Sign    Days of Exercise per Week: 0 days    Minutes of Exercise per Session: 0 min  Stress: No Stress Concern Present (02/06/2023)   [employer] of Occupational Health - Occupational Stress Questionnaire    Feeling of Stress : Not at all  Social Connections: Moderately Isolated (02/12/2024)   Social  Connection and Isolation Panel    Frequency of Communication with Friends and Family: More than three times a week    Frequency of Social Gatherings with Friends and Family: More than three times a week    Attends Religious Services: More than 4 times per year    Active Member of [employer] or Organizations: No    Attends Banker Meetings: Never    Marital Status: Widowed  Intimate Partner Violence: Not  At Risk (02/12/2024)   Humiliation, Afraid, Rape, and Kick questionnaire    Fear of Current or Ex-Partner: No    Emotionally Abused: No    Physically Abused: No    Sexually Abused: No    Family History  Problem Relation Age of Onset   Thyroid  disease Mother    Depression Mother    Hypertension Mother    Osteoporosis Mother    Colon cancer Father    Pneumonia Father    Liver disease Brother        from alcohol   Alcohol abuse Brother        terminal    Alcohol abuse Brother    Breast cancer Maternal Aunt    Breast cancer Paternal Aunt    Breast cancer Paternal Aunt    Thyroid  disease Other        half sister   Colon cancer Other        Aunt   Esophageal cancer Neg Hx    Stomach cancer Neg Hx    Rectal cancer Neg Hx     Review of Systems:  As stated in the HPI and otherwise negative.   BP 126/78   Pulse 62   Ht 4' 11.5 (1.511 m)   Wt 150 lb 6.4 oz (68.2 kg)   SpO2 99%   BMI 29.87 kg/m   Physical Examination: General: Well developed, well nourished, NAD  HEENT: OP clear, mucus membranes moist  SKIN: warm, dry. No rashes. Neuro: No focal deficits  Musculoskeletal: Muscle strength 5/5 all ext  Psychiatric: Mood and affect normal  Neck: No JVD, no carotid bruits, no thyromegaly, no lymphadenopathy.  Lungs:Clear bilaterally, no wheezes, rhonci, crackles Cardiovascular: Regular rate and rhythm. No murmurs, gallops or rubs. Abdomen:Soft. Bowel sounds present. Non-tender.  Extremities: No lower extremity edema. Pulses are 2 + in the bilateral DP/PT.  EKG:   EKG is ordered today. The ekg ordered today demonstrates  EKG Interpretation Date/Time:  Thursday May 29 2024 09:47:48 EST Ventricular Rate:  62 PR Interval:  124 QRS Duration:  132 QT Interval:  440 QTC Calculation: 446 R Axis:   -69  Text Interpretation: Normal sinus rhythm Right bundle branch block Left anterior fascicular block Bifascicular block Confirmed by Verlin Bruckner [phone]) on 05/29/2024 9:52:14 AM   Recent Labs: 02/07/2024: ALT 9; TSH 0.627 04/07/2024: BUN 14; Creatinine, Ser 0.88; Hemoglobin 11.6; Magnesium 2.0; Platelets 238; Potassium 3.8; Sodium 136   Lipid Panel    Component Value Date/Time   CHOL 148 02/07/2024 1146   TRIG 132 02/07/2024 1146   HDL 61 02/07/2024 1146   CHOLHDL 2.4 02/07/2024 1146   CHOLHDL 2 01/24/2021 1416   VLDL 24.0 01/24/2021 1416   LDLCALC 64 02/07/2024 1146     Wt Readings from Last 3 Encounters:  05/29/24 150 lb 6.4 oz (68.2 kg)  04/07/24 149 lb 3.2 oz (67.7 kg)  02/07/24 149 lb 6.4 oz (67.8 kg)    Assessment and Plan:   1. Chest pain: She has resting and exertiona. Will arrange cardiac CT to exclude CAD.   2. Non-sustained VT: Will arrange cardiac CT as above to exclude CAD. Echo to assess LV size and function.   Labs/ tests ordered today include:   Orders Placed This Encounter  Procedures   CT CORONARY MORPH W/CTA COR W/SCORE W/CA W/CM &/OR WO/CM   EKG 12-Lead   ECHOCARDIOGRAM COMPLETE   Disposition:   F/U with me in 12 months   Signed, Bruckner Verlin, MD, FACC  05/29/2024 10:31 AM    [facility] [address] [place], [place], [place]  [address] Phone: [phone]; Fax: [phone]

## 2024-07-09 ENCOUNTER — Telehealth (HOSPITAL_COMMUNITY): Payer: Self-pay | Admitting: *Deleted

## 2024-07-09 NOTE — Telephone Encounter (Signed)
Reaching out to patient to offer assistance regarding upcoming cardiac imaging study; pt verbalizes understanding of appt date/time, parking situation and where to check in, pre-test NPO status  and verified current allergies; name and call back number provided for further questions should they arise ? ?Adalaya Irion RN Navigator Cardiac Imaging ?[facility] ?[phone] office ?[phone] cell ? ?

## 2024-07-11 ENCOUNTER — Ambulatory Visit (HOSPITAL_COMMUNITY)
Admission: RE | Admit: 2024-07-11 | Discharge: 2024-07-11 | Disposition: A | Discharge status: Home / Self Care | Source: Ambulatory Visit | Attending: Cardiovascular Disease | Admitting: Cardiovascular Disease

## 2024-07-11 DIAGNOSIS — R079 Chest pain, unspecified: Secondary | ICD-10-CM

## 2024-07-11 DIAGNOSIS — R072 Precordial pain: Secondary | ICD-10-CM | POA: Diagnosis not present

## 2024-07-11 DIAGNOSIS — I4729 Other ventricular tachycardia: Secondary | ICD-10-CM | POA: Diagnosis present

## 2024-07-11 DIAGNOSIS — R931 Abnormal findings on diagnostic imaging of heart and coronary circulation: Secondary | ICD-10-CM | POA: Insufficient documentation

## 2024-07-11 LAB — ECHOCARDIOGRAM COMPLETE
Area-P 1/2: 2.85 cm2
MV M vel: 5.3 m/s
MV Peak grad: 112.4 mmHg
S' Lateral: 2.8 cm

## 2024-07-11 MED ORDER — NITROGLYCERIN 0.4 MG SL SUBL
0.8000 mg | SUBLINGUAL_TABLET | Freq: Once | SUBLINGUAL | Status: AC
  Administered 2024-07-11: 0.8 mg via SUBLINGUAL

## 2024-07-11 MED ORDER — IOHEXOL 350 MG/ML SOLN
100.0000 mL | Freq: Once | INTRAVENOUS | Status: AC | PRN
  Administered 2024-07-11: 100 mL via INTRAVENOUS

## 2024-07-13 ENCOUNTER — Ambulatory Visit (HOSPITAL_COMMUNITY)
Admission: RE | Admit: 2024-07-13 | Discharge: 2024-07-13 | Disposition: A | Source: Ambulatory Visit | Attending: Cardiology | Admitting: Cardiology

## 2024-07-13 ENCOUNTER — Other Ambulatory Visit: Payer: Self-pay | Admitting: Cardiology

## 2024-07-13 DIAGNOSIS — R931 Abnormal findings on diagnostic imaging of heart and coronary circulation: Secondary | ICD-10-CM | POA: Diagnosis not present

## 2024-07-14 ENCOUNTER — Ambulatory Visit: Payer: Self-pay | Admitting: Cardiovascular Disease

## 2024-07-15 MED ORDER — ASPIRIN 81 MG PO TBEC: 81.0000 mg | DELAYED_RELEASE_TABLET | Freq: Every day | ORAL | Status: AC

## 2024-08-05 ENCOUNTER — Emergency Department (HOSPITAL_BASED_OUTPATIENT_CLINIC_OR_DEPARTMENT_OTHER): Admitting: Radiology

## 2024-08-05 ENCOUNTER — Emergency Department (HOSPITAL_BASED_OUTPATIENT_CLINIC_OR_DEPARTMENT_OTHER): Admission: EM | Admit: 2024-08-05 | Discharge: 2024-08-05 | Disposition: A | Discharge status: Home / Self Care

## 2024-08-05 ENCOUNTER — Other Ambulatory Visit: Payer: Self-pay

## 2024-08-05 DIAGNOSIS — R0781 Pleurodynia: Secondary | ICD-10-CM | POA: Insufficient documentation

## 2024-08-05 DIAGNOSIS — Z7982 Long term (current) use of aspirin: Secondary | ICD-10-CM | POA: Insufficient documentation

## 2024-08-05 DIAGNOSIS — R0789 Other chest pain: Secondary | ICD-10-CM

## 2024-08-05 DIAGNOSIS — M549 Dorsalgia, unspecified: Secondary | ICD-10-CM | POA: Diagnosis present

## 2024-08-05 DIAGNOSIS — W08XXXA Fall from other furniture, initial encounter: Secondary | ICD-10-CM | POA: Diagnosis not present

## 2024-08-05 DIAGNOSIS — M62830 Muscle spasm of back: Secondary | ICD-10-CM | POA: Insufficient documentation

## 2024-08-05 DIAGNOSIS — W19XXXA Unspecified fall, initial encounter: Secondary | ICD-10-CM

## 2024-08-05 MED ORDER — METHOCARBAMOL 500 MG PO TABS: 500.0000 mg | ORAL_TABLET | Freq: Four times a day (QID) | ORAL | 0 refills | Status: AC | PRN

## 2024-08-05 MED ORDER — LIDOCAINE 5 % EX PTCH: 1.0000 | MEDICATED_PATCH | CUTANEOUS | 0 refills | Status: AC

## 2024-08-05 MED ORDER — KETOROLAC TROMETHAMINE 15 MG/ML IJ SOLN
15.0000 mg | Freq: Once | INTRAMUSCULAR | Status: AC
  Administered 2024-08-05: 15 mg via INTRAMUSCULAR
  Filled 2024-08-05: qty 1

## 2024-08-05 NOTE — ED Triage Notes (Signed)
 Pt reports 2 nights ago while sleeping on the couch she forgot that she was on the couch and not in her bed and rolled off the couch. Pt c/o R side back pain since.

## 2024-08-05 NOTE — ED Provider Notes (Signed)
 " [place] EMERGENCY DEPARTMENT AT [facility] Provider Note   CSN: [id number] Arrival date & time: 08/05/24  1336     Patient presents with: Deborah Gardner is a 79 y.o. female.  {Add pertinent medical, surgical, social history, OB history to HPI:7149} 79 year old female presents for evaluation of fall.  She states 2 nights ago she was sitting on the couch, forgot she is on the couch and rolled over fell off the couch hitting her right side.  States she has had right side pain since and is having some trouble moving due to the pain.  Denies losing consciousness or hitting her head.  She is not on any blood thinners.  Denies any other symptoms or concerns.  States when she sits still she feels fine but every time she moves she gets a sharp pain.   Fall Pertinent negatives include no chest pain, no abdominal pain and no shortness of breath.       Prior to Admission medications  Medication Sig Start Date End Date Taking? Authorizing Provider  acetaminophen  (TYLENOL ) 325 MG tablet Take 650 mg by mouth every 6 (six) hours as needed.    [provider]  aspirin  EC 81 MG tablet Take 1 tablet (81 mg total) by mouth daily. Swallow whole. 07/15/24   Verlin Lonni BIRCH, MD  Calcium  Carb-Cholecalciferol (CALCIUM  + VITAMIN D3 PO) Take by mouth.    [provider]  DULoxetine  (CYMBALTA ) 20 MG capsule Take 1 capsule (20 mg total) by mouth daily. TAKE 1 CAPSULE BY MOUTH EVERY DAY 02/11/24   Tysinger, Alm RAMAN, PA-C  fluticasone  (FLONASE ) 50 MCG/ACT nasal spray SPRAY 1 SPRAY INTO BOTH NOSTRILS DAILY. 11/26/23   Tysinger, Alm RAMAN, PA-C  hydrochlorothiazide  (HYDRODIURIL ) 25 MG tablet Take 1 tablet (25 mg total) by mouth daily. 02/08/24   Tysinger, Alm RAMAN, PA-C  ibuprofen (ADVIL) 200 MG tablet Take 200 mg by mouth every 6 (six) hours as needed. Takes 2 once or twice daily    [provider]  loratadine  (CLARITIN ) 10 MG tablet TAKE 1 TABLET BY MOUTH EVERY DAY  08/31/22   Tysinger, Alm RAMAN, PA-C  metoprolol  tartrate (LOPRESSOR ) 50 MG tablet Take one tablet by mouth 2 hours prior to CT scan 05/29/24   Verlin Lonni BIRCH, MD  omeprazole  (PRILOSEC) 20 MG capsule TAKE 1 CAPSULE BY MOUTH DAILY BEFORE A MEAL 02/08/24   Tysinger, Alm RAMAN, PA-C  rosuvastatin  (CRESTOR ) 10 MG tablet Take 1 tablet (10 mg total) by mouth daily. 02/08/24   Tysinger, Alm RAMAN, PA-C  timolol (TIMOPTIC) 0.5 % ophthalmic solution Place 1 drop into both eyes 2 (two) times daily. 01/24/13   [provider]  TURMERIC PO Take by mouth.    [provider]    Allergies: Sulfa  antibiotics, Amlodipine  besylate, Atorvastatin , Lotrel [amlodipine  besy-benazepril  hcl], and Molnupiravir     Review of Systems  Constitutional:  Negative for chills and fever.  HENT:  Negative for ear pain and sore throat.   Eyes:  Negative for pain and visual disturbance.  Respiratory:  Negative for cough and shortness of breath.   Cardiovascular:  Negative for chest pain and palpitations.  Gastrointestinal:  Negative for abdominal pain and vomiting.  Genitourinary:  Negative for dysuria and hematuria.  Musculoskeletal:  Positive for back pain. Negative for arthralgias.  Skin:  Negative for color change and rash.  Neurological:  Negative for seizures and syncope.  All other systems reviewed and are negative.   Updated Vital Signs  BP 125/82 (BP Location: Left Arm)   Pulse 78   Temp 98 F (36.7 C) (Oral)   Resp 16   Ht 4' 11.5 (1.511 m)   Wt 68 kg   SpO2 100%   BMI 29.79 kg/m   Physical Exam Vitals and nursing note reviewed.  Constitutional:      General: She is not in acute distress.    Appearance: Normal appearance. She is well-developed. She is not ill-appearing.  HENT:     Head: Normocephalic and atraumatic.  Eyes:     Conjunctiva/sclera: Conjunctivae normal.  Cardiovascular:     Rate and Rhythm: Normal rate and regular rhythm.     Heart sounds: No murmur heard. Pulmonary:      Effort: Pulmonary effort is normal. No respiratory distress.     Breath sounds: Normal breath sounds.  Abdominal:     Palpations: Abdomen is soft.     Tenderness: There is no abdominal tenderness.  Musculoskeletal:        General: Tenderness present. No swelling.     Cervical back: Neck supple.     Comments: There is right posterior rib tenderness to palpation on the lower ribs but no line tenderness  Skin:    General: Skin is warm and dry.     Capillary Refill: Capillary refill takes less than 2 seconds.  Neurological:     General: No focal deficit present.     Mental Status: She is alert.  Psychiatric:        Mood and Affect: Mood normal.     (all labs ordered are listed, but only abnormal results are displayed) Labs Reviewed - No data to display  EKG: None  Radiology: No results found.  {Document cardiac monitor, telemetry assessment procedure when appropriate:32947} Procedures   Medications Ordered in the ED  ketorolac  (TORADOL ) 15 MG/ML injection 15 mg (has no administration in time range)      {Click here for ABCD2, HEART and other calculators REFRESH Note before signing:1}                              Medical Decision Making Amount and/or Complexity of Data Reviewed Radiology: ordered.  Risk Prescription drug management.   ***  {Document critical care time when appropriate  Document review of labs and clinical decision tools ie CHADS2VASC2, etc  Document your independent review of radiology images and any outside records  Document your discussion with family members, caretakers and with consultants  Document social determinants of health affecting pt's care  Document your decision making why or why not admission, treatments were needed:32947:::1}   Final diagnoses:  None    ED Discharge Orders     None        "

## 2024-08-05 NOTE — Discharge Instructions (Signed)
 You can alternate Tylenol  and Motrin as needed for pain.  You can use the Robaxin  up to 4 times a day as needed.  You can also use your lidocaine  patches as needed.

## 2024-08-05 NOTE — ED Notes (Signed)
 Reviewed AVS/discharge instructions with patient. Time allotted for and all questions answered. Patient is agreeable for d/c and escorted to ED exit by staff.

## 2025-02-17 ENCOUNTER — Ambulatory Visit: Payer: Self-pay
